# Patient Record
Sex: Female | Born: 1955
Health system: Southern US, Community
[De-identification: ages and names within clinical notes are randomized; demographics above are authoritative.]

## PROBLEM LIST (undated history)

## (undated) DIAGNOSIS — M1990 Unspecified osteoarthritis, unspecified site: Secondary | ICD-10-CM

## (undated) DIAGNOSIS — Z973 Presence of spectacles and contact lenses: Secondary | ICD-10-CM

## (undated) DIAGNOSIS — E785 Hyperlipidemia, unspecified: Secondary | ICD-10-CM

## (undated) DIAGNOSIS — M359 Systemic involvement of connective tissue, unspecified: Secondary | ICD-10-CM

## (undated) DIAGNOSIS — G43909 Migraine, unspecified, not intractable, without status migrainosus: Secondary | ICD-10-CM

## (undated) DIAGNOSIS — E2839 Other primary ovarian failure: Secondary | ICD-10-CM

## (undated) DIAGNOSIS — T148XXA Other injury of unspecified body region, initial encounter: Secondary | ICD-10-CM

## (undated) DIAGNOSIS — C4490 Unspecified malignant neoplasm of skin, unspecified: Secondary | ICD-10-CM

## (undated) DIAGNOSIS — I639 Cerebral infarction, unspecified: Secondary | ICD-10-CM

## (undated) DIAGNOSIS — T8859XA Other complications of anesthesia, initial encounter: Secondary | ICD-10-CM

## (undated) DIAGNOSIS — T7840XA Allergy, unspecified, initial encounter: Secondary | ICD-10-CM

## (undated) DIAGNOSIS — R87619 Unspecified abnormal cytological findings in specimens from cervix uteri: Secondary | ICD-10-CM

## (undated) DIAGNOSIS — E559 Vitamin D deficiency, unspecified: Secondary | ICD-10-CM

## (undated) DIAGNOSIS — I10 Essential (primary) hypertension: Secondary | ICD-10-CM

## (undated) DIAGNOSIS — J45909 Unspecified asthma, uncomplicated: Secondary | ICD-10-CM

## (undated) DIAGNOSIS — K219 Gastro-esophageal reflux disease without esophagitis: Secondary | ICD-10-CM

## (undated) DIAGNOSIS — G43119 Migraine with aura, intractable, without status migrainosus: Secondary | ICD-10-CM

## (undated) HISTORY — DX: Unspecified abnormal cytological findings in specimens from cervix uteri: R87.619

## (undated) HISTORY — DX: Other injury of unspecified body region, initial encounter: T14.8XXA

## (undated) HISTORY — PX: OTHER SURGICAL HISTORY: SHX169

## (undated) HISTORY — DX: Hyperlipidemia, unspecified: E78.5

## (undated) HISTORY — PX: COLONOSCOPY: SHX174

## (undated) HISTORY — DX: Allergy, unspecified, initial encounter: T78.40XA

## (undated) HISTORY — DX: Gastro-esophageal reflux disease without esophagitis: K21.9

## (undated) HISTORY — DX: Migraine with aura, intractable, without status migrainosus: G43.119

## (undated) HISTORY — DX: Migraine, unspecified, not intractable, without status migrainosus: G43.909

## (undated) HISTORY — DX: Cerebral infarction, unspecified: I63.9

## (undated) HISTORY — DX: Systemic involvement of connective tissue, unspecified: M35.9

---
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
## 1998-08-07 ENCOUNTER — Other Ambulatory Visit: Admission: RE | Admit: 1998-08-07 | Discharge: 1998-08-07 | Payer: Self-pay | Admitting: Obstetrics and Gynecology

## 1999-08-15 ENCOUNTER — Other Ambulatory Visit: Admission: RE | Admit: 1999-08-15 | Discharge: 1999-08-15 | Payer: Self-pay | Admitting: Obstetrics and Gynecology

## 2000-09-04 ENCOUNTER — Other Ambulatory Visit: Admission: RE | Admit: 2000-09-04 | Discharge: 2000-09-04 | Payer: Self-pay | Admitting: Obstetrics and Gynecology

## 2000-11-03 ENCOUNTER — Encounter (INDEPENDENT_AMBULATORY_CARE_PROVIDER_SITE_OTHER): Payer: Self-pay | Admitting: Specialist

## 2000-11-04 ENCOUNTER — Inpatient Hospital Stay (HOSPITAL_COMMUNITY): Admission: RE | Admit: 2000-11-04 | Discharge: 2000-11-05 | Payer: Self-pay | Admitting: Obstetrics and Gynecology

## 2001-04-29 HISTORY — PX: VAGINAL HYSTERECTOMY: SUR661

## 2001-04-29 HISTORY — PX: ABDOMINAL HYSTERECTOMY: SHX81

## 2002-04-12 ENCOUNTER — Encounter (INDEPENDENT_AMBULATORY_CARE_PROVIDER_SITE_OTHER): Payer: Self-pay | Admitting: Internal Medicine

## 2002-04-12 ENCOUNTER — Ambulatory Visit (HOSPITAL_COMMUNITY): Admission: RE | Admit: 2002-04-12 | Discharge: 2002-04-12 | Payer: Self-pay | Admitting: Internal Medicine

## 2002-04-16 ENCOUNTER — Ambulatory Visit (HOSPITAL_COMMUNITY): Admission: RE | Admit: 2002-04-16 | Discharge: 2002-04-16 | Payer: Self-pay | Admitting: Internal Medicine

## 2002-04-23 ENCOUNTER — Encounter (INDEPENDENT_AMBULATORY_CARE_PROVIDER_SITE_OTHER): Payer: Self-pay | Admitting: Internal Medicine

## 2002-04-23 ENCOUNTER — Ambulatory Visit (HOSPITAL_COMMUNITY): Admission: RE | Admit: 2002-04-23 | Discharge: 2002-04-23 | Payer: Self-pay | Admitting: Internal Medicine

## 2003-02-04 ENCOUNTER — Other Ambulatory Visit: Admission: RE | Admit: 2003-02-04 | Discharge: 2003-02-04 | Payer: Self-pay | Admitting: Obstetrics and Gynecology

## 2004-01-03 ENCOUNTER — Emergency Department (HOSPITAL_COMMUNITY): Admission: EM | Admit: 2004-01-03 | Discharge: 2004-01-03 | Payer: Self-pay | Admitting: Family Medicine

## 2004-04-29 DIAGNOSIS — R87619 Unspecified abnormal cytological findings in specimens from cervix uteri: Secondary | ICD-10-CM

## 2004-04-29 HISTORY — DX: Unspecified abnormal cytological findings in specimens from cervix uteri: R87.619

## 2005-01-04 ENCOUNTER — Other Ambulatory Visit: Admission: RE | Admit: 2005-01-04 | Discharge: 2005-01-04 | Payer: Self-pay | Admitting: Obstetrics and Gynecology

## 2005-03-26 ENCOUNTER — Emergency Department (HOSPITAL_COMMUNITY): Admission: EM | Admit: 2005-03-26 | Discharge: 2005-03-27 | Payer: Self-pay | Admitting: Emergency Medicine

## 2005-03-28 ENCOUNTER — Ambulatory Visit: Payer: Self-pay | Admitting: Internal Medicine

## 2005-04-01 ENCOUNTER — Ambulatory Visit (HOSPITAL_COMMUNITY): Admission: RE | Admit: 2005-04-01 | Discharge: 2005-04-01 | Payer: Self-pay | Admitting: Internal Medicine

## 2005-08-06 ENCOUNTER — Ambulatory Visit (HOSPITAL_COMMUNITY): Admission: RE | Admit: 2005-08-06 | Discharge: 2005-08-06 | Payer: Self-pay | Admitting: Internal Medicine

## 2005-09-09 ENCOUNTER — Inpatient Hospital Stay (HOSPITAL_COMMUNITY): Admission: RE | Admit: 2005-09-09 | Discharge: 2005-09-12 | Payer: Self-pay | Admitting: Neurosurgery

## 2007-04-30 HISTORY — PX: BACK SURGERY: SHX140

## 2009-10-16 ENCOUNTER — Ambulatory Visit (HOSPITAL_COMMUNITY): Admission: RE | Admit: 2009-10-16 | Discharge: 2009-10-16 | Payer: Self-pay | Admitting: Obstetrics and Gynecology

## 2009-10-25 ENCOUNTER — Ambulatory Visit (HOSPITAL_COMMUNITY): Admission: RE | Admit: 2009-10-25 | Discharge: 2009-10-25 | Payer: Self-pay | Admitting: Obstetrics and Gynecology

## 2010-09-14 NOTE — Op Note (Signed)
[facility]  Patient:    Victoria Cooper, Victoria Cooper                    MRN: [id number] Proc. Date: 11/03/00 Adm. Date:  [id number] Attending:  Rhina Brackett                           Operative Report  PREOPERATIVE DIAGNOSIS:       Menorrhagia.  POSTOPERATIVE DIAGNOSIS:      Menorrhagia.  OPERATION/PROCEDURE:          Total vaginal hysterectomy.  SURGEON:                      Duke Salvia. Marcelle Overlie, M.D.  ASSISTANTWilley Blade, M.D.  ANESTHESIA:                   Epidural.  COMPLICATIONS:                None.  DRAINS:                       In-and-out Foley catheter.  ESTIMATED BLOOD LOSS:         Estimated blood loss was 150 cc.  DESCRIPTION OF PROCEDURE:     The patient was taken to the operating room and after an adequate level of epidural anesthesia was obtained, with the patients legs in stirrups, the perineum and vagina were prepped and draped in the usual manner for a vaginal procedure.  The bladder was drained.  EUA was carried out and the uterus was normal size, mid position, mobile, adnexa negative.  A weighted speculum was positioned and the cervix grasped with a tenaculum.  The cervicovaginal mucosa was incised with the scalpel and a posterior colpotomy performed without difficulty.  The right uterosacral ligament was then divided and tissue ligated in Heaney fashion with 0 Dexon and held, and the same repeated on the opposite side.  The bladder was advanced superiorly with sharp and blunt dissection and the anterior peritoneal reflection identified, entered sharply, and a retractor positioned to gently elevate the bladder out of the field.  Using the Ligasure coagulation system the cardinal ligament, uterine vasculature pedicle, and upper broad ligament pedicles were clamped, coagulated, and divided, with excellent hemostasis.  The fundus of the uterus was then delivered posteriorly and the utero-ovarian pedicles  were clamped, divided, and first a free-tie followed by suture ligature of 0 Dexon placed.  The cuff was then closed with a running 2-0 Dexon suture.  All major pedicles were inspected and noted to be hemostatic.  Prior to closure sponge, needle, and instrument counts were reported as correct x 2.  A McCalls culdoplasty suture of 2-0 Dexon was then placed from the left uterosacral ligament, picking up posterior peritoneum across to the right uterosacral ligament and tied down to reinforce posterior support.  The cuff was closed from right to left with interrupted 2-0 Monocryl sutures.  The Foley catheter was in position and draining clear urine.  She tolerated this well and went to the recovery room in good condition. DD:  11/03/00 TD:  11/03/00 Job: [id number]

## 2010-09-14 NOTE — Op Note (Signed)
NAME:  Victoria Cooper, Victoria Cooper NO.:  [id number]   MEDICAL RECORD NO.:  [id number]          PATIENT TYPE:  INP   LOCATION:  3004                         FACILITY:  [facility]   PHYSICIAN:  Hewitt Shorts, M.D.DATE OF BIRTH:  [birth year]   DATE OF PROCEDURE:  09/09/2005  DATE OF DISCHARGE:                                 OPERATIVE REPORT   PREOPERATIVE DIAGNOSES:  1.  L5 pars interarticularis defect.  2.  Static grade 1-2 L5 and S1 spondylolisthesis.  3.  L4-5 lumbar disk herniation.  4.  L5-S1 lumbar disk herniation.  5.  Lumbar radiculopathy.   POSTOPERATIVE DIAGNOSES:  1.  L5 pars interarticularis defect.  2.  Static grade 1-2 L5 and S1 spondylolisthesis.  3.  L4-5 lumbar disk herniation.  4.  L5-S1 lumbar disk herniation.  5.  Lumbar radiculopathy.   PROCEDURE:  1.  L4 laminectomy.  2.  L5 Gill procedure.  3.  Left L4-5 transverse posterior lumbar interbody fusion procedure.  4.  L5-S1 posterior lumbar interbody fusion procedure.  5.  L4-S1 posterolateral arthrodesis with AVS PEEK interbody implants for      interbody fusions and 90-D posterior instrumentation for the      posterolateral arthrodesis and VITOSS with bone marrow aspirate and      locally harvested morcelized autograft and microdissection.   SURGEON:  Hewitt Shorts, M.D.   ASSISTANTS:  1.  Stefani Dama, M.D.  2.  Perlie Gold L. Webb Silversmith, R.N.   INDICATION:  The patient is a 55 year old woman who presented with low back  and bilateral lumbar radicular pain, right worse than left.  The patient was  found to have L5 pars interarticular defect with a grade 1-2  spondylolisthesis of L5 on S1 with essentially left L4-5 lumbar disk  herniation and broad-based disk herniation at L5-S1 and a decision was made  to proceed with decompression and arthrodesis.   DESCRIPTION OF PROCEDURE:  The patient was brought to the operating room and  placed under general endotracheal anesthesia.  The patient was  turned to a  prone position and the lumbar region was prepped with Betadine soaping  solution and draped in a sterile fashion.  The midline was infiltrated with  a local anesthetic with epinephrine.  An x-ray was taken and the L4-5 and L5-  S1 levels were identified and a midline incision was made over the L4-5 and  L5-S1 levels and carried down through the subcutaneous tissue.  Bipolar  cautery and electrocautery were used to maintain hemostasis.  Dissection was  carried down to the lumbar fascia, which was incised bilaterally and the  paraspinal muscles dissected of the spinous processes and lamina in a  subperiosteal fashion.  A self-retaining retractor was placed.  The loose  posterior elements of L5 were immediately identified, as was the listhesis  and x-ray was taken through to confirm the localization and then we  proceeded with L5 Gill procedure, removing the posterior elements intact.  These were subsequently cleaned of soft tissue and morcelized to later be  used as bone graft.  We then performed inferior L4 laminectomy and  the  microscope was then draped and brought into the field to provide additional  magnification, illumination and visualization and the remainder of the  decompression was performed using microdissection and microsurgical  technique.  There was significant spondylitic overgrowth and pseudoarthrosis  associated with the listhesis.  We were able to progressively remove that  and decompress the exiting L5 nerve roots bilaterally.  The left L4-5  annulus was identified, as was the L5-S1 annulus identified bilaterally.  On  the right side at L5-S1, there was a disk herniation with a fragment  protruding through the annulus.  There was subligamentous disk herniation as  well at L5-S1, as well as central and to the left at L4-5.  Dissection was  then performed on the left side at L4-5 and bilaterally at L5-S1 with  incision of the annulus continued with a variety of  pituitary rongeurs and  microcurettes.  There was significant spondylitic spurring, particularly  from the posterosuperior aspect of S1 and this was carefully removed and we  were able to decompress the thecal sac and nerve roots bilaterally.  The  disk spaces were then prepared for interbody arthrodesis.  The cartilaginous  endplates were carefully removed and then we measured the height of the  intervertebral disk spaces and selected a 12-mm PLIF implant to place at the  L4-5 level and 8-mm in height implants for the L5-S1 level.  We placed a 25-  mm implant on the right and a 20-mm implant on the left at L5-S1.  Once the  diskectomy and endplate preparation had been completed, we probed the L5  pedicle and aspirated bone marrow aspirate; it was injected over a 10-mL  strip of VITOSS and then each of the implants were packed with the VITOSS  with bone marrow aspirate.  We first carefully retracted the thecal sac and  nerve root at the L4-5 level and positioned the TLIF implant.  We then  proceeded to the L5-S1 level, placing first the implant on the right side  and subsequently the implant on the left side.  Each of those implants was  sunk down to the posterior aspect of the L5 vertebral body.  We then  proceeded with the posterolateral arthrodesis.  We exposed the transverse  process of L4 and L5 bilaterally as well as the ala of S1.  We selected  pedicle entry sites at L4, L5 and S1 bilaterally and each of the pedicles  were probed using C-arm fluoroscopic guidance.  They were examined with a  ball probe to ensure there were no cutouts and then each of the pedicles was  tapped.  We tapped the L4 and L5 pedicles with a 4.25-mm tap and the S1  pedicle with a 5.25-mm tap.  We again examined each of the pedicles with a  ball probe to ensure good threading and no cutouts and we placed 4.75 x 45-  mm screws on the left side at L4 and L5 and 4.75 x 40-mm screws on the right side at L4 and L5  and 5.75 x 30-mm screws bilaterally at S1.  We selected 50-  mm pre-lordosed rods; they were placed in the screw heads and secured with  locking caps, which were subsequently tightened against counter-torque.  Once all of the construct was completed, the C-arm fluoroscope was removed  and we then decorticated the transverse process of L4 and L5 bilaterally as  well as the ala of S1 and we packed the lateral gutters with the morcelized  autograft as well as the additional VITOSS with bone marrow aspirate.  The  wound was irrigated numerous times during the procedure with Bacitracin  solution.  Good hemostasis was established and confirmed at the completion  of the procedure and then we proceeded with closure.  The deep fascia was  closed with interrupted undyed #1 Vicryl sutures, Scarpa's fascia closed  with interrupted undyed inverted #1Vicryl and the subcutaneous and  subcuticular layers were closed with interrupted and inverted 2-0 undyed  Vicryl and the skin was reapproximated with Dermabond and the wound was  dressed with Adaptic, sterile gauze and Hypafix.  The procedure was  tolerated well.  The estimated blood loss was 600 mL.  We did return 300 mL  of Cell Saver blood to the patient.  Sponge, instrument and needle count  were correct.  Following surgery, the patient is to be turned back to the  supine position, to be reversed from the anesthetic, extubated and  transferred to the recovery room for further care.     Hewitt Shorts, M.D.  Electronically Signed    RWN/MEDQ  D:  09/09/2005  T:  09/10/2005  Job:  [id number]

## 2010-09-14 NOTE — H&P (Signed)
NAME:  Victoria Cooper, Victoria Cooper             ACCOUNT NO.:  [id number]   MEDICAL RECORD NO.:  [id number]          PATIENT TYPE:  INP   LOCATION:  NA                           FACILITY:  [facility]   PHYSICIAN:  Hewitt Shorts, M.D.DATE OF BIRTH:  1955-06-18   DATE OF ADMISSION:  09/09/2005  DATE OF DISCHARGE:                                HISTORY & PHYSICAL   HISTORY OF PRESENT ILLNESS:  The patient is a 55 year old right-handed white  female who is seen for evaluation of L5 pars interarticularis defect and an  L5 and S1 congenital spondylolisthesis, an L5-S1 disk herniation with  pseudoarthrosis and stenosis, as well as a left L4-5 disk herniation.   The patient explains that up until about two months ago, her symptoms were  only occasional minimal low back discomfort but then two months ago she  developed numbness across the dorsum of her right foot into the right heel,  increasing low back pain, pain into the buttock and thighs bilaterally,  right worse than left.  At times it seems to bother one side more, at other  times the other side more.  She had been treated initially with Aleve.  Then  she was tried on Relafen.  She typically found that the more active she was,  the more comfortable, and has elected to go ahead with the surgical  decompression and stabilization of the spine and is admitted now for such.   PAST MEDICAL HISTORY:  1.  History of hypertension.  2.  Some mild asthma.  3.  No history of myocardial infarction, cancer, stroke, diabetes or peptic      ulcer disease.   PAST SURGICAL HISTORY:  Hysterectomy in July 2002 by Duke Salvia. Marcelle Overlie,  M.D.   She reports an allergy to SULFA.   CURRENT MEDICATIONS:  1.  Lotrel 5/20 mg q.a.m.  2.  Nasonex spray p.r.n.  3.  Albuterol inhaler p.r.n.  4.  Advair 100/50 mcg one puff b.i.d.  5.  Relafen 500 mg b.i.d.   FAMILY HISTORY:  Her parents both have hypertension.  Her father has had  bypass surgery 23 years ago.  Mother is  age 40, father is age 61.   SOCIAL HISTORY:  The patient is married.  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.  She does not smoke.  She drinks alcoholic beverages  occasionally.  She denies a history of substance abuse.   REVIEW OF SYSTEMS:  Notable for those described in the history of present  illness and past medical history but is otherwise unremarkable.   PHYSICAL EXAMINATION:  GENERAL:  The patient is a well-developed, well-  nourished white female in no acute distress.  VITAL SIGNS:  Her temperature is 97.0, pulse is 83, blood pressure 103/68,  respiratory rate 18.  Height 5 feet 2 inches.  Weight 143 pounds.  LUNGS:  Clear to auscultation.  She has symmetrical respiratory excursion.  CARDIAC:  A regular rhythm with normal S1, S2.  There is no murmur.  ABDOMEN:  Soft,  nondistended, bowel sounds are present.  EXTREMITIES:  There is no clubbing, cyanosis or edema.  MUSCULOSKELETAL:  There is no tenderness on palpation of the lumbar spinous  processes or paralumbar musculature.  She is able to flex to 80 degrees,  able to extend to 20 degrees.  She has mild discomfort on flexion-extension.  Straight leg raising is negative bilaterally.  NEUROLOGIC:  5/5 strength of the lower extremities including the iliopsoas,  quadriceps, dorsiflexors, extensor hallucis longus and plantar flexor  bilaterally.  Sensation is intact to pinprick through the upper and lower  extremities.  Reflexes are 2 at the quadriceps and gastrocnemius and  symmetrical bilaterally.  Toes are downgoing bilaterally.  She has a normal  gait and stance.   DIAGNOSTIC STUDIES:  X-rays show L5 pars interarticularis defect with a  grade 1-2 spondylolisthesis at L5-S1, which is static through flexion-  extension.  MRI scan reconfirms the spondylolisthesis.  There is  degenerative disk disease with desiccation of the disks at L4-5 and  L5-S1.  At L4-5 there is a central to the left disk protrusion and at L5-S1 there is  a bilateral L5 pars interarticularis defect and a pseudoarthrosis, right  worse than left.  There is neural foraminal stenosis, right versus left,  with broad-based disk herniation at that level.   IMPRESSION:  Patient with low back lumbar radicular pain and numbness.  She  has an L5 pars interarticularis defect (congenital), and L5-S1  spondylolisthesis with associated disk herniation, pseudoarthrosis and  stenosis, and at L4-5 there is a disk herniation central and to the left.   PLAN:  The patient will be admitted for an L5 Gill procedure, L4  laminectomy, L4-5 and L5-S1 posterior lumbar interbody fusion and  posterolateral arthrodesis with interbody implants, posterior  instrumentation and bone graft.  We have discussed the nature of the  surgical procedure at length with the patient on several occasions as well  as risks of surgery including the risks of infection, bleeding with  possibility of transfusion, the risk of nerve root dysfunction, pain,  weakness, numbness or paresthesias, the risk of dural tear or CSF leak, the  possible need for further surgery, and anesthetic risks of myocardial  infarction, stroke, pneumonia and death.  Understanding all this, she wishes  to proceed with surgery and is admitted for such.      Hewitt Shorts, M.D.  Electronically Signed     RWN/MEDQ  D:  09/09/2005  T:  09/09/2005  Job:  [id number]

## 2010-09-14 NOTE — Discharge Summary (Signed)
[facility]  Patient:    Victoria Cooper, Victoria Cooper                    MRN: [id number] Adm. Date:  [id number] Disc. Date: 11/05/00 Attending:  Rhina Brackett                           Discharge Summary  DISCHARGE DIAGNOSES:          1. Menorrhagia.                               2. Total vaginal hysterectomy this admission.  SUMMARY OF THE HISTORY AND PHYSICAL EXAMINATION:         Please see admission history and physical for details. Briefly, a 55 year old, G3, P3, her husband has had a vasectomy, with menorrhagia unresponsive to conservative measures, presents now for total vaginal hysterectomy.  HOSPITAL COURSE:              On July 8, under epidural anesthesia, the patient underwent TVH without complication. EBL was 150 cc. On the first postoperative day she was afebrile. Her diet was advanced. By the second postoperative day, she was feeling well, tolerating a regular diet and was ready for discharge at that point. At the time of discharge, she was afebrile, ambulating without difficulty.  LABORATORY DATA:              Preoperative hemoglobin 12.5, postoperative was 11.5. UA on admission was negative. Blood type was A positive, antibody screen was negative.  DISPOSITION:                  Patient was discharged on Tylox p.r.n. pain. She will return to the office in seven to ten days. She was advised to report any increased vaginal bleeding, fever over 101, or urinary complaints. She was given specific instructions regarding diet, sex, exercise.  CONDITION:                    Good.  ACTIVITY:                     Gradually increase. DD:  11/05/00 TD:  11/05/00 Job: [id number]

## 2010-09-14 NOTE — Discharge Summary (Signed)
NAME:  Victoria Cooper, MANCINO NO.:  [id number]   MEDICAL RECORD NO.:  [id number]          PATIENT TYPE:  INP   LOCATION:  3004                         FACILITY:  [facility]   PHYSICIAN:  Hewitt Shorts, M.D.DATE OF BIRTH:  1955/12/14   DATE OF ADMISSION:  09/09/2005  DATE OF DISCHARGE:  09/12/2005                                 DISCHARGE SUMMARY   ADMISSION HISTORY AND PHYSICAL EXAMINATION:  The patient is a 55 year old  woman who presented with low back pain and pain radiating down to the lower  extremities.  She had an L5 pars interarticularis defect with a congenital  grade 1-2 L5 on S1 spondylolisthesis, with an L5-S1 disc herniation with  pseudoarthrosis and stenosis, as well as a left L4-5 lumbar disc herniation.  Decision made to admit the patient for decompression and arthrodesis.  Further details of her history are included in her admission note.  Her  examination showed an unremarkable general examination.  Neurological  examination showed intact strength and sensation.   HOSPITAL COURSE:  The patient was admitted, underwent an L4 laminectomy, L5  Gill procedure, left L4-5 TLIF, bilateral L5-S1 PLIF, and an L4-S1  posterolateral arthrodesis with posterior instrumentation and bone graft.  Following surgery the patient did well with good relief of her pain and  discomfort.  She got up the evening of surgery.  The following day we were  able to discontinue her Foley catheter and PCA morphine pump.  Yesterday we  hoped to let her go home.  However, when she first got up in the morning,  sitting up in a chair she had a syncopal episode.  She was returned to bed  and improved.  Since then she has been treated solely with Naprosyn, has had  good comfort and no further syncopal spells.  She has been up and  ambulating.  We removed her dressing and her wound is healing nicely, she is  afebrile, and we are going to discharge her to home today.  We have given  her  instructions regarding wound care and activities.  She is to wear her  lumbar corset when she is up and about.  She is given a number of  prescriptions including Vicodin one or two q.4-6h. p.r.n. pain (moderate)  #40 tablets, no refills; Darvocet-N 100 one or two tablets p.o. q.4-6h.  p.r.n. pain (mild) #40 tablets, no refills; Flexeril 5 mg t.i.d. p.r.n.  muscle spasms #40 tablets, one refill.  She is also advised to use Relafen  which she has at home 500 mg b.i.d.   She is to return to my office in 3 weeks for followup with AP and lumbar  spine x-rays.  She is to contact us if she has any difficulties.   Her normal blood pressure pill, Lotrel, has been held while she has been in  the hospital because her blood pressure has been relatively low, and I have  told her to monitor her blood pressure at home.  When it is consistently  greater than 120 systolic she can resume taking her blood pressure pill and  she should follow up  with her primary care physician, Dr. Merri Brunette.   DISCHARGE DIAGNOSES:  1.  L5 pars interarticularis defect with an associated grade 1-2      spondylolisthesis L5 on S1.  2.  Lumbar stenosis.  3.  Lumbar disc herniation.  4.  Lumbar radiculopathy.  5.  Syncopal spell.      Hewitt Shorts, M.D.  Electronically Signed     RWN/MEDQ  D:  09/12/2005  T:  09/12/2005  Job:  [id number]

## 2010-09-14 NOTE — H&P (Signed)
[facility]  Patient:    Victoria Cooper, Victoria Cooper                      MRN: [id number] Adm. Date:  11/03/00 Attending:  Duke Salvia. Marcelle Overlie, M.D.                         History and Physical  CHIEF COMPLAINT:              Menorrhagia.  HISTORY OF PRESENT ILLNESS:   A 55 year old G3, P3.  Her husband has had a vasectomy.  She has had bothersome menorrhagia since 1998.  Ultrasound done June 2001 was normal.  We had discussed the possibility of OCPs, which she had declined until recently.  Due to heavier bleeding, she has required Loestrin 1/20 b.i.d. and has been on this q.d. since June 9 with resolution of her bleeding.  She has also been on Hemocyte as an iron replacement.  She presents now for [facility].  This procedure including risks, relative bleeding, infection, adjacent organ injury, the possible need for open or additional surgery, were all discussed with her, which she understands and accepts.  Of note, we did a trial of OCPs in 1998 without a substantial improvement in her menorrhagia with a negative saline hysterogram.  ALLERGIES:                    None.  PAST SURGICAL HISTORY:        None.  PAST OBSTETRICAL HISTORY:     Three vaginal deliveries at term.  Her husband has had a vasectomy.  REVIEW OF SYSTEMS:            Otherwise unremarkable.  PHYSICAL EXAMINATION:  VITAL SIGNS:                  Temperature 98.2, blood pressure 120/78.  HEENT:                        Unremarkable.  NECK:                         Supple without mass.  LUNGS:                        Clear.  CARDIOVASCULAR:               Regular rate and rhythm without murmurs, rubs, or gallops.  BREASTS:                      Without masses.  ABDOMEN:                      Soft, flat, and nontender.  PELVIC:                       Normal external genitalia.  ______ clear. Uterus mid position, normal size, mobile.  Adnexa negative.  IMPRESSION:                   Menorrhagia  unresponsive to conservative measures.  PLAN:                         Total vaginal hysterectomy.  Procedure and risks discussed, as above. DD:  10/24/00 TD:  10/24/00 Job: [id number]

## 2010-09-14 NOTE — Op Note (Signed)
NAME:  Victoria Cooper, Victoria Cooper                       ACCOUNT NO.:  [id number]   MEDICAL RECORD NO.:  [id number]                   PATIENT TYPE:  AMB   LOCATION:  DAY                                  FACILITY:  [facility]   PHYSICIAN:  Lionel December, M.D.                 DATE OF BIRTH:  1955-10-17   DATE OF PROCEDURE:  04/16/2002  DATE OF DISCHARGE:                                 OPERATIVE REPORT   PROCEDURE:  Total colonoscopy with terminal ileoscopy.   INDICATIONS:  The patient is a 55 year old Caucasian female with over a year  history of intermittent episodes of diarrhea and left-sided pain.  Stool  studies are negative.  She had small bowel study which revealed short  transit time, otherwise normal.  The radiologist made a comment that she  could have colonic diverticulosis or diverticulitis; however, this was not a  formal lower GI tract study.  She is now undergoing colonoscopy to further  evaluate these symptoms.  The procedure and risks were reviewed with the  patient, and informed consent was obtained.   PREMEDICATION:  Demerol 50 mg IV, Versed 5 mg IV in divided dose.   INSTRUMENT USED:  Olympus video system.   FINDINGS:  Procedure performed in endoscopy suite.  The patient's vital  signs and O2 saturation were monitored during the procedure and remained  stable.  The patient was placed in the left lateral recumbent position and  rectal examination performed.  This was within normal limits.  The scope was  placed in the rectum and advanced under vision into the sigmoid colon and  beyond.  The preparation was satisfactory.  The scope was passed into the  cecum, which was identified by ileocecal valve and appendiceal orifice.  Pictures taken for the record.  The TI was also examined for 10-15 cm and  was normal.  Once again pictures were taken for the record.  As the scope  was withdrawn, colonic mucosa was carefully examined and was normal  throughout.  There were no diverticula  noted.  The rectal mucosa was normal.  The scope was retroflexed to examine the anorectal junction, and small  hemorrhoids were noted below the dentate line.  The endoscope was withdrawn.  The patient tolerated the procedure well.   FINAL DIAGNOSIS:  Normal colonoscopy and terminal ileoscopy except small  external hemorrhoids.    RECOMMENDATIONS:  1. She will resume her Bentyl at 10 mg t.i.d., which was started last week     when she was seen in the office.  2. Will proceed with 24-hour urine collection for 5HIAA and abdominopelvic     CT.                                               Lionel December, M.D.  NR/MEDQ  D:  04/16/2002  T:  04/17/2002  Job:  [id number]   cc:   Kingsley Callander. Ouida Sills, M.D.  [address]  [place]  [place] [address]  Fax: [phone]   [facility] Salvia. Marcelle Overlie, M.D.  [address] [place]  [place] [address]  Fax: [phone]

## 2011-03-05 ENCOUNTER — Other Ambulatory Visit (HOSPITAL_COMMUNITY): Payer: Self-pay | Admitting: Obstetrics and Gynecology

## 2011-03-05 DIAGNOSIS — Z139 Encounter for screening, unspecified: Secondary | ICD-10-CM

## 2011-03-11 ENCOUNTER — Ambulatory Visit (HOSPITAL_COMMUNITY)
Admission: RE | Admit: 2011-03-11 | Discharge: 2011-03-11 | Disposition: A | Discharge status: Home / Self Care | Payer: 59 | Source: Ambulatory Visit | Attending: Obstetrics and Gynecology | Admitting: Obstetrics and Gynecology

## 2011-03-11 ENCOUNTER — Emergency Department (HOSPITAL_COMMUNITY)
Admission: EM | Admit: 2011-03-11 | Discharge: 2011-03-11 | Disposition: A | Discharge status: Home / Self Care | Payer: 59 | Attending: Emergency Medicine | Admitting: Emergency Medicine

## 2011-03-11 ENCOUNTER — Encounter: Payer: Self-pay | Admitting: Emergency Medicine

## 2011-03-11 DIAGNOSIS — Z1231 Encounter for screening mammogram for malignant neoplasm of breast: Secondary | ICD-10-CM | POA: Insufficient documentation

## 2011-03-11 DIAGNOSIS — S301XXA Contusion of abdominal wall, initial encounter: Secondary | ICD-10-CM | POA: Insufficient documentation

## 2011-03-11 DIAGNOSIS — S31109A Unspecified open wound of abdominal wall, unspecified quadrant without penetration into peritoneal cavity, initial encounter: Secondary | ICD-10-CM | POA: Insufficient documentation

## 2011-03-11 DIAGNOSIS — Z139 Encounter for screening, unspecified: Secondary | ICD-10-CM

## 2011-03-11 DIAGNOSIS — W540XXA Bitten by dog, initial encounter: Secondary | ICD-10-CM | POA: Insufficient documentation

## 2011-03-11 DIAGNOSIS — Z23 Encounter for immunization: Secondary | ICD-10-CM | POA: Insufficient documentation

## 2011-03-11 DIAGNOSIS — I10 Essential (primary) hypertension: Secondary | ICD-10-CM | POA: Insufficient documentation

## 2011-03-11 HISTORY — DX: Essential (primary) hypertension: I10

## 2011-03-11 MED ORDER — IBUPROFEN 800 MG PO TABS
800.0000 mg | ORAL_TABLET | Freq: Once | ORAL | Status: AC
  Administered 2011-03-11: 800 mg via ORAL
  Filled 2011-03-11: qty 1

## 2011-03-11 MED ORDER — RABIES IMMUNE GLOBULIN 150 UNIT/ML IM INJ
INJECTION | INTRAMUSCULAR | Status: AC
  Filled 2011-03-11: qty 10

## 2011-03-11 MED ORDER — RABIES IMMUNE GLOBULIN 150 UNIT/ML IM INJ
20.0000 [IU]/kg | INJECTION | Freq: Once | INTRAMUSCULAR | Status: DC
  Filled 2011-03-11: qty 9.5

## 2011-03-11 MED ORDER — AMOXICILLIN-POT CLAVULANATE 875-125 MG PO TABS: 1.0000 | ORAL_TABLET | Freq: Once | ORAL | Status: AC

## 2011-03-11 MED ORDER — AMOXICILLIN-POT CLAVULANATE 875-125 MG PO TABS
1.0000 | ORAL_TABLET | Freq: Once | ORAL | Status: AC
  Administered 2011-03-11: 1 via ORAL
  Filled 2011-03-11: qty 1

## 2011-03-11 MED ORDER — RABIES IMMUNE GLOBULIN 150 UNIT/ML IM INJ
20.0000 [IU]/kg | INJECTION | Freq: Once | INTRAMUSCULAR | Status: AC
  Administered 2011-03-11: 1425 [IU] via INTRAMUSCULAR
  Filled 2011-03-11: qty 9.5

## 2011-03-11 MED ORDER — RABIES VACCINE, PCEC IM SUSR
1.0000 mL | Freq: Once | INTRAMUSCULAR | Status: AC
  Administered 2011-03-11: 1 mL via INTRAMUSCULAR
  Filled 2011-03-11: qty 1

## 2011-03-11 NOTE — ED Provider Notes (Signed)
Medical screening examination/treatment/procedure(s) were performed by non-physician practitioner and as supervising physician I was immediately available for consultation/collaboration.   Kaytlen Lightsey L Mathews Stuhr, MD 03/11/11 2322 

## 2011-03-11 NOTE — ED Notes (Signed)
Pt a/ox4. resp even and unlabored. NAD at this time. D/Cinstructions reviewed with pt. Pt verbalized understanding. Pt ambulated to POV with steady gate.  

## 2011-03-11 NOTE — ED Notes (Signed)
Pt c/o dog bite to her abd. Dog did not have a rabies shot and pt was sent over by her pcp for rabies injection.

## 2011-03-11 NOTE — ED Notes (Signed)
Attempted to make report to [facility] per [facility]. 

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
## 2011-03-14 ENCOUNTER — Encounter (HOSPITAL_COMMUNITY): Payer: 59 | Attending: Emergency Medicine

## 2011-03-14 DIAGNOSIS — Z23 Encounter for immunization: Secondary | ICD-10-CM | POA: Insufficient documentation

## 2011-03-14 DIAGNOSIS — Z203 Contact with and (suspected) exposure to rabies: Secondary | ICD-10-CM | POA: Insufficient documentation

## 2011-03-14 DIAGNOSIS — T148XXA Other injury of unspecified body region, initial encounter: Secondary | ICD-10-CM

## 2011-03-14 MED ORDER — RABIES VACCINE, PCEC IM SUSR
1.0000 mL | Freq: Once | INTRAMUSCULAR | Status: AC
  Administered 2011-03-14: 1 mL via INTRAMUSCULAR

## 2011-03-14 NOTE — Progress Notes (Signed)
Tracie Harrier presents today for injection per MD orders. Rabavert 1ml administered IM in RT deltoid  Administration without incident. Patient tolerated well.

## 2011-03-18 ENCOUNTER — Encounter (HOSPITAL_COMMUNITY): Payer: Self-pay

## 2011-03-18 ENCOUNTER — Encounter (HOSPITAL_COMMUNITY): Payer: 59

## 2011-03-18 DIAGNOSIS — T148XXA Other injury of unspecified body region, initial encounter: Secondary | ICD-10-CM | POA: Insufficient documentation

## 2011-03-18 HISTORY — DX: Other injury of unspecified body region, initial encounter: T14.8XXA

## 2011-03-18 MED ORDER — RABIES VACCINE, PCEC IM SUSR
1.0000 mL | Freq: Once | INTRAMUSCULAR | Status: AC
  Administered 2011-03-18: 1 mL via INTRAMUSCULAR
  Filled 2011-03-18: qty 1

## 2011-03-18 NOTE — Progress Notes (Signed)
rabavert  Rabies vaccine given IM Z-track to right deltoidl tolerated well.Marland Kitchen

## 2011-03-25 ENCOUNTER — Encounter (HOSPITAL_COMMUNITY): Payer: 59

## 2011-03-25 DIAGNOSIS — T148XXA Other injury of unspecified body region, initial encounter: Secondary | ICD-10-CM

## 2011-03-25 MED ORDER — RABIES VACCINE, PCEC IM SUSR
1.0000 mL | Freq: Once | INTRAMUSCULAR | Status: AC
  Administered 2011-03-25: 1 mL via INTRAMUSCULAR
  Filled 2011-03-25: qty 1

## 2011-03-25 NOTE — Progress Notes (Signed)
Tolerated IM injection to rt deltoid well.

## 2011-07-04 ENCOUNTER — Ambulatory Visit (INDEPENDENT_AMBULATORY_CARE_PROVIDER_SITE_OTHER): Payer: 59 | Admitting: Otolaryngology

## 2011-07-04 DIAGNOSIS — R490 Dysphonia: Secondary | ICD-10-CM

## 2011-07-04 DIAGNOSIS — K219 Gastro-esophageal reflux disease without esophagitis: Secondary | ICD-10-CM

## 2011-07-04 DIAGNOSIS — R070 Pain in throat: Secondary | ICD-10-CM

## 2011-08-01 ENCOUNTER — Ambulatory Visit (INDEPENDENT_AMBULATORY_CARE_PROVIDER_SITE_OTHER): Payer: 59 | Admitting: Otolaryngology

## 2011-08-08 ENCOUNTER — Ambulatory Visit (INDEPENDENT_AMBULATORY_CARE_PROVIDER_SITE_OTHER): Payer: 59 | Admitting: Otolaryngology

## 2011-08-29 ENCOUNTER — Ambulatory Visit (INDEPENDENT_AMBULATORY_CARE_PROVIDER_SITE_OTHER): Payer: 59 | Admitting: Otolaryngology

## 2012-05-21 ENCOUNTER — Encounter (HOSPITAL_BASED_OUTPATIENT_CLINIC_OR_DEPARTMENT_OTHER): Payer: Self-pay | Admitting: *Deleted

## 2012-05-21 NOTE — Progress Notes (Signed)
To come in for bmet=ekg-chaplain Jeani Hawking Bring all meds and inhalers-overnight bag in case she needs to stay

## 2012-05-22 ENCOUNTER — Encounter (HOSPITAL_BASED_OUTPATIENT_CLINIC_OR_DEPARTMENT_OTHER)
Admission: RE | Admit: 2012-05-22 | Discharge: 2012-05-22 | Disposition: A | Discharge status: Home / Self Care | Payer: 59 | Source: Ambulatory Visit | Attending: Plastic Surgery | Admitting: Plastic Surgery

## 2012-05-22 LAB — BASIC METABOLIC PANEL
BUN: 19 mg/dL (ref 6–23)
Calcium: 9.3 mg/dL (ref 8.4–10.5)
Creatinine, Ser: 0.59 mg/dL (ref 0.50–1.10)
GFR calc Af Amer: >90 mL/min (ref >90)
GFR calc non Af Amer: >90 mL/min (ref >90)

## 2012-05-25 ENCOUNTER — Encounter (HOSPITAL_BASED_OUTPATIENT_CLINIC_OR_DEPARTMENT_OTHER): Payer: Self-pay | Admitting: Plastic Surgery

## 2012-05-25 ENCOUNTER — Other Ambulatory Visit: Payer: Self-pay | Admitting: Plastic Surgery

## 2012-05-25 NOTE — H&P (Signed)
Victoria Cooper is an 57 y.o. female.   Chief Complaint: back and shoulder pain secondary to significant bilateral mammary hypertrophy. [id number] has had back surgery in past, and above symptoms persist with additional breast hypertrophy status.  Past Medical History  Diagnosis Date  . Hypertension   . Animal bite 03/18/2011  . Asthma   . Arthritis   . Wears glasses     Past Surgical History  Procedure Date  . Back surgery 2007    lumb lam-fusion  . Abdominal hysterectomy 2003    hyst  . Colonoscopy     History reviewed. No pertinent family history. Social History:  reports that she has never smoked. She does not have any smokeless tobacco history on file. She reports that she drinks alcohol. She reports that she does not use illicit drugs.  Allergies:  Allergies  Allergen Reactions  . Morphine And Related Itching  . Sulfa Antibiotics Itching and Other (See Comments)    Itching in palms of hand and feet    No prescriptions prior to admission    No results found for this or any previous visit (from the past 48 hour(s)). No results found.  Review of Systems  Constitutional: Negative.   HENT: Positive for neck pain.   Eyes: Negative.   Respiratory: Negative.   Cardiovascular: Negative.   Gastrointestinal: Negative.   Genitourinary: Negative.   Musculoskeletal: Positive for back pain.  Skin: Negative.   Neurological: Negative.   Endo/Heme/Allergies: Negative.   Psychiatric/Behavioral: Negative.     Height 5\' 2"  (1.575 m), weight 70.308 kg (155 lb). 57 yo female with long standing back, shoulder and neck pain and significant bilateral mammary hypertrophy, admitted for bilateral reduction mammaplasty Physical Exam  Constitutional: She is oriented to person, place, and time. She appears well-nourished.  HENT:  Head: Normocephalic.  Eyes: Pupils are equal, round, and reactive to light.  Neck: Normal range of motion.  Cardiovascular: Normal rate, regular  rhythm and normal heart sounds.   Respiratory: Effort normal and breath sounds normal.  GI: Soft. Bowel sounds are normal.  Musculoskeletal: Normal range of motion.  Neurological: She is alert and oriented to person, place, and time.  Skin: Skin is warm and dry.  Psychiatric: She has a normal mood and affect.     Assessment/Plan PLAN BILATERAL REDUCTION MAMMAPLASTY  Vitaliy Eisenhour 05/25/2012, 2:06 PM

## 2012-05-26 ENCOUNTER — Encounter (HOSPITAL_BASED_OUTPATIENT_CLINIC_OR_DEPARTMENT_OTHER): Payer: Self-pay | Admitting: Certified Registered"

## 2012-05-26 ENCOUNTER — Ambulatory Visit (HOSPITAL_BASED_OUTPATIENT_CLINIC_OR_DEPARTMENT_OTHER): Payer: 59 | Admitting: Certified Registered"

## 2012-05-26 ENCOUNTER — Encounter (HOSPITAL_BASED_OUTPATIENT_CLINIC_OR_DEPARTMENT_OTHER)
Admission: RE | Disposition: A | Discharge status: Home / Self Care | Payer: Self-pay | Source: Ambulatory Visit | Attending: Plastic Surgery

## 2012-05-26 ENCOUNTER — Encounter (HOSPITAL_BASED_OUTPATIENT_CLINIC_OR_DEPARTMENT_OTHER): Payer: Self-pay

## 2012-05-26 ENCOUNTER — Ambulatory Visit (HOSPITAL_BASED_OUTPATIENT_CLINIC_OR_DEPARTMENT_OTHER)
Admission: RE | Admit: 2012-05-26 | Discharge: 2012-05-27 | Disposition: A | Discharge status: Home / Self Care | Payer: 59 | Source: Ambulatory Visit | Attending: Plastic Surgery | Admitting: Plastic Surgery

## 2012-05-26 DIAGNOSIS — Z01810 Encounter for preprocedural cardiovascular examination: Secondary | ICD-10-CM | POA: Insufficient documentation

## 2012-05-26 DIAGNOSIS — N62 Hypertrophy of breast: Secondary | ICD-10-CM | POA: Insufficient documentation

## 2012-05-26 DIAGNOSIS — I10 Essential (primary) hypertension: Secondary | ICD-10-CM | POA: Insufficient documentation

## 2012-05-26 HISTORY — DX: Presence of spectacles and contact lenses: Z97.3

## 2012-05-26 HISTORY — PX: BREAST REDUCTION SURGERY: SHX8

## 2012-05-26 HISTORY — DX: Unspecified asthma, uncomplicated: J45.909

## 2012-05-26 HISTORY — DX: Unspecified osteoarthritis, unspecified site: M19.90

## 2012-05-26 LAB — POCT HEMOGLOBIN-HEMACUE: Hemoglobin: 15.1 g/dL — ABNORMAL HIGH (ref 12.0–15.0)

## 2012-05-26 SURGERY — MAMMOPLASTY, REDUCTION
Anesthesia: General | Site: Breast | Laterality: Bilateral | Wound class: Clean

## 2012-05-26 MED ORDER — DEXTROSE IN LACTATED RINGERS 5 % IV SOLN
INTRAVENOUS | Status: AC
  Administered 2012-05-26: 12:00:00 via INTRAVENOUS

## 2012-05-26 MED ORDER — LIDOCAINE HCL (CARDIAC) 20 MG/ML IV SOLN
INTRAVENOUS | Status: DC | PRN
  Administered 2012-05-26: 50 mg via INTRAVENOUS

## 2012-05-26 MED ORDER — FENTANYL CITRATE 0.05 MG/ML IJ SOLN: 50.0000 ug | INTRAMUSCULAR | Status: DC | PRN

## 2012-05-26 MED ORDER — FENTANYL CITRATE 0.05 MG/ML IJ SOLN
INTRAMUSCULAR | Status: DC | PRN
  Administered 2012-05-26 (×2): 100 ug via INTRAVENOUS

## 2012-05-26 MED ORDER — ACETAMINOPHEN 650 MG RE SUPP: 650.0000 mg | Freq: Four times a day (QID) | RECTAL | Status: DC | PRN

## 2012-05-26 MED ORDER — ONDANSETRON HCL 4 MG/2ML IJ SOLN
INTRAMUSCULAR | Status: DC | PRN
  Administered 2012-05-26: 4 mg via INTRAVENOUS

## 2012-05-26 MED ORDER — PROPOFOL 10 MG/ML IV BOLUS
INTRAVENOUS | Status: DC | PRN
  Administered 2012-05-26: 130 mg via INTRAVENOUS
  Administered 2012-05-26: 70 mg via INTRAVENOUS
  Administered 2012-05-26: 100 mg via INTRAVENOUS

## 2012-05-26 MED ORDER — MIDAZOLAM HCL 5 MG/5ML IJ SOLN
INTRAMUSCULAR | Status: DC | PRN
  Administered 2012-05-26: 2 mg via INTRAVENOUS

## 2012-05-26 MED ORDER — OXYCODONE HCL 5 MG PO TABS: 5.0000 mg | ORAL_TABLET | Freq: Once | ORAL | Status: AC | PRN

## 2012-05-26 MED ORDER — HYDROMORPHONE HCL 2 MG PO TABS
2.0000 mg | ORAL_TABLET | ORAL | Status: DC | PRN
  Administered 2012-05-26 – 2012-05-27 (×4): 2 mg via ORAL

## 2012-05-26 MED ORDER — HYDROMORPHONE HCL PF 1 MG/ML IJ SOLN: 0.2500 mg | INTRAMUSCULAR | Status: DC | PRN

## 2012-05-26 MED ORDER — OXYCODONE HCL 5 MG/5ML PO SOLN: 5.0000 mg | Freq: Once | ORAL | Status: AC | PRN

## 2012-05-26 MED ORDER — DEXAMETHASONE SODIUM PHOSPHATE 4 MG/ML IJ SOLN
INTRAMUSCULAR | Status: DC | PRN
  Administered 2012-05-26: 8 mg via INTRAVENOUS

## 2012-05-26 MED ORDER — ACETAMINOPHEN 10 MG/ML IV SOLN
1000.0000 mg | Freq: Once | INTRAVENOUS | Status: AC
  Administered 2012-05-26: 1000 mg via INTRAVENOUS

## 2012-05-26 MED ORDER — CEFAZOLIN SODIUM-DEXTROSE 2-3 GM-% IV SOLR
2.0000 g | INTRAVENOUS | Status: AC
  Administered 2012-05-26: 2 g via INTRAVENOUS

## 2012-05-26 MED ORDER — EPHEDRINE SULFATE 50 MG/ML IJ SOLN
INTRAMUSCULAR | Status: DC | PRN
  Administered 2012-05-26 (×3): 10 mg via INTRAVENOUS

## 2012-05-26 MED ORDER — LACTATED RINGERS IV SOLN
INTRAVENOUS | Status: DC
  Administered 2012-05-26 (×4): via INTRAVENOUS

## 2012-05-26 MED ORDER — CEFAZOLIN SODIUM 1-5 GM-% IV SOLN
1.0000 g | Freq: Three times a day (TID) | INTRAVENOUS | Status: AC
  Administered 2012-05-26 (×2): 1 g via INTRAVENOUS

## 2012-05-26 MED ORDER — SUCCINYLCHOLINE CHLORIDE 20 MG/ML IJ SOLN
INTRAMUSCULAR | Status: DC | PRN
  Administered 2012-05-26: 100 mg via INTRAVENOUS

## 2012-05-26 MED ORDER — MIDAZOLAM HCL 2 MG/2ML IJ SOLN
1.0000 mg | INTRAMUSCULAR | Status: DC | PRN
Start: 2012-05-26 — End: 2012-05-26

## 2012-05-26 MED ORDER — ONDANSETRON HCL 4 MG/2ML IJ SOLN: 4.0000 mg | Freq: Four times a day (QID) | INTRAMUSCULAR | Status: DC | PRN

## 2012-05-26 MED ORDER — ACETAMINOPHEN 325 MG PO TABS: 650.0000 mg | ORAL_TABLET | Freq: Four times a day (QID) | ORAL | Status: DC | PRN

## 2012-05-26 MED ORDER — HYDROMORPHONE HCL PF 1 MG/ML IJ SOLN: 0.5000 mg | INTRAMUSCULAR | Status: DC | PRN

## 2012-05-26 SURGICAL SUPPLY — 45 items
BALL CTTN LRG ABS STRL LF (GAUZE/BANDAGES/DRESSINGS)
BANDAGE ELASTIC 6 VELCRO ST LF (GAUZE/BANDAGES/DRESSINGS) ×4 IMPLANT
BLADE SURG 10 STRL SS (BLADE) ×4 IMPLANT
BLADE SURG 15 STRL LF DISP TIS (BLADE) ×1 IMPLANT
BLADE SURG 15 STRL SS (BLADE) ×2
CANISTER SUCTION 1200CC (MISCELLANEOUS) ×2 IMPLANT
CHLORAPREP W/TINT 26ML (MISCELLANEOUS) IMPLANT
CLOTH BEACON ORANGE TIMEOUT ST (SAFETY) ×2 IMPLANT
COTTONBALL LRG STERILE PKG (GAUZE/BANDAGES/DRESSINGS) IMPLANT
COVER MAYO STAND STRL (DRAPES) ×2 IMPLANT
COVER TABLE BACK 60X90 (DRAPES) ×2 IMPLANT
DRAPE LAPAROSCOPIC ABDOMINAL (DRAPES) ×2 IMPLANT
DRSG PAD ABDOMINAL 8X10 ST (GAUZE/BANDAGES/DRESSINGS) ×8 IMPLANT
ELECT REM PT RETURN 9FT ADLT (ELECTROSURGICAL) ×2
ELECTRODE REM PT RTRN 9FT ADLT (ELECTROSURGICAL) ×1 IMPLANT
FILTER 7/8 IN (FILTER) ×2 IMPLANT
GAUZE SPONGE 4X4 12PLY STRL LF (GAUZE/BANDAGES/DRESSINGS) IMPLANT
GAUZE XEROFORM 5X9 LF (GAUZE/BANDAGES/DRESSINGS) ×4 IMPLANT
GLOVE BIO SURGEON STRL SZ 6.5 (GLOVE) IMPLANT
GLOVE BIO SURGEON STRL SZ7 (GLOVE) ×1 IMPLANT
GLOVE BIO SURGEON STRL SZ7.5 (GLOVE) IMPLANT
GLOVE BIO SURGEON STRL SZ8 (GLOVE) ×2 IMPLANT
GLOVE BIOGEL PI IND STRL 8 (GLOVE) ×1 IMPLANT
GLOVE BIOGEL PI INDICATOR 8 (GLOVE) ×1
GLOVE SURG ORTHO 8.0 STRL STRW (GLOVE) IMPLANT
GOWN PREVENTION PLUS XLARGE (GOWN DISPOSABLE) ×3 IMPLANT
GOWN PREVENTION PLUS XXLARGE (GOWN DISPOSABLE) ×1 IMPLANT
NS IRRIG 1000ML POUR BTL (IV SOLUTION) ×2 IMPLANT
PACK BASIN DAY SURGERY FS (CUSTOM PROCEDURE TRAY) ×2 IMPLANT
PAD EYE OVAL STERILE LF (GAUZE/BANDAGES/DRESSINGS) IMPLANT
SLEEVE SCD COMPRESS KNEE MED (MISCELLANEOUS) ×1 IMPLANT
SPECIMEN JAR X LARGE (MISCELLANEOUS) ×4 IMPLANT
SPONGE GAUZE 4X4 12PLY (GAUZE/BANDAGES/DRESSINGS) ×4 IMPLANT
SPONGE LAP 18X18 X RAY DECT (DISPOSABLE) ×8 IMPLANT
STRIP CLOSURE SKIN 1/2X4 (GAUZE/BANDAGES/DRESSINGS) ×8 IMPLANT
SUT MNCRL AB 3-0 PS2 18 (SUTURE) ×16 IMPLANT
SUT MNCRL AB 4-0 PS2 18 (SUTURE) ×8 IMPLANT
SUT VIC AB 2-0 PS2 27 (SUTURE) ×2 IMPLANT
SYR BULB IRRIGATION 50ML (SYRINGE) ×4 IMPLANT
TOWEL OR 17X24 6PK STRL BLUE (TOWEL DISPOSABLE) ×6 IMPLANT
TRAY FOLEY CATH 14FR (SET/KITS/TRAYS/PACK) IMPLANT
TUBE CONNECTING 20X1/4 (TUBING) ×2 IMPLANT
UNDERPAD 30X30 INCONTINENT (UNDERPADS AND DIAPERS) ×2 IMPLANT
VAC PENCILS W/TUBING CLEAR (MISCELLANEOUS) ×2 IMPLANT
YANKAUER SUCT BULB TIP NO VENT (SUCTIONS) ×2 IMPLANT

## 2012-05-26 NOTE — Op Note (Signed)
NAME:  Victoria Cooper, Victoria Cooper NO.:  [id number]  MEDICAL RECORD NO.:  [id number]  LOCATION:                                 FACILITY:  PHYSICIAN:  Consuello Bossier., M.D.DATE OF BIRTH:  July 17, 1955  DATE OF PROCEDURE:  05/26/2012 DATE OF DISCHARGE:                              OPERATIVE REPORT   PREOPERATIVE DIAGNOSIS:  Symptomatic bilateral mammary hypertrophy.  POSTOPERATIVE DIAGNOSIS:  Symptomatic bilateral mammary hypertrophy.  OPERATION:  Bilateral reduction mammoplasty.  SURGEON:  Pleas Dejai, M.D.  ASSISTANT:  Enedina Finner, RNFA.  ANESTHESIA:  General endotracheal.  FINDINGS:  The patient had symptomatic bilateral mammary hypertrophy with discomfort in her chest, upper shoulder, and back area.  She has had previous back surgery.  Bilateral reduction mammoplasty was performed removing approximately 1237 g of tissue from the tuberous.  PROCEDURE IN DETAIL:  The patient was brought to the operating room after being marked in the upright position for the planned surgical procedure.  She was given general endotracheal anesthetic.  Prepped with Betadine and draped both breasts in a sterile fashion.  Initially, the keyhole area was de-epithelialized, as was the inferior pedicle of a planned vertical bipedicle nipple areolar graft.  An inframammary incision was made and then continued up along the planned medial aspect of the vertical bipedicle graft.  Similar procedure was carried out laterally creating the pedicle graft.  A large triangular segment of full-thickness breast tissue was removed from the medial aspect and continuity with central segment leaving a 1 cm in depth superior pedicle.  Similar triangular segment was removed laterally, 631 g of tissue were removed from the left breast and 601 g of tissue from the right breast.  There was noted to be good hemostasis.  Wound was irrigated with normal saline.  Following this, medial and lateral  flaps were brought together to a predetermined position along the inframammary line with interrupted #2-0 Vicryl.  #3-0 Monocryl was then used to approximate the circumareolar and vertical and inframammary incisions in a subcuticular closure, followed by running #4-0 Monocryl closure.  Nipple color was excellent.  Steri-Strips, Xeroform, 4 x 8's, ABD, and cervicothoracic Ace bandage were applied.  The patient tolerated the procedure well, was able to be discharged from the operating room to the recovery room in satisfactory condition.     Consuello Bossier., M.D.     HH/MEDQ  D:  05/26/2012  T:  05/26/2012  Job:  [id number]

## 2012-05-26 NOTE — Anesthesia Postprocedure Evaluation (Signed)
  Anesthesia Post-op Note  Patient: Victoria Cooper  Procedure(s) Performed: Procedure(s) (LRB) with comments: MAMMARY REDUCTION  (BREAST) (Bilateral)  Patient Location: PACU  Anesthesia Type:General  Level of Consciousness: awake and alert   Airway and Oxygen Therapy: Patient Spontanous Breathing  Post-op Pain: mild  Post-op Assessment: Post-op Vital signs reviewed, Patient's Cardiovascular Status Stable, Respiratory Function Stable, Patent Airway and No signs of Nausea or vomiting  Post-op Vital Signs: Reviewed and stable  Complications: No apparent anesthesia complications

## 2012-05-26 NOTE — Op Note (Signed)
Operation: bilateral reduction mammaplasty Surgeon: Noah Lembke Ass't: B Cox, RNFA  Blood loss: c200 ml   Drains: none  Patient tolerated the procedure well, to PARR in satisfactory condition

## 2012-05-26 NOTE — Anesthesia Procedure Notes (Signed)
Procedure Name: Intubation Date/Time: 05/26/2012 7:51 AM Performed by: Verlan Friends Pre-anesthesia Checklist: Patient identified, Emergency Drugs available, Suction available, Patient being monitored and Timeout performed Patient Re-evaluated:Patient Re-evaluated prior to inductionOxygen Delivery Method: Circle System Utilized Preoxygenation: Pre-oxygenation with 100% oxygen Intubation Type: IV induction Ventilation: Mask ventilation without difficulty Laryngoscope Size: Miller and 2 Grade View: Grade I Tube type: Oral Tube size: 7.0 mm Number of attempts: 1 Airway Equipment and Method: stylet and oral airway Placement Confirmation: ETT inserted through vocal cords under direct vision,  positive ETCO2 and breath sounds checked- equal and bilateral Secured at: 21 cm Tube secured with: Tape Dental Injury: Teeth and Oropharynx as per pre-operative assessment

## 2012-05-26 NOTE — Interval H&P Note (Signed)
History and Physical Interval Note:  05/26/2012 7:43 AM  Victoria Cooper  has presented today for surgery, with the diagnosis of BILATERAL BREAST HYPER THROPHY   The various methods of treatment have been discussed with the patient and family. After consideration of risks, benefits and other options for treatment, the patient has consented to  Procedure(s) (LRB) with comments: MAMMARY REDUCTION  (BREAST) (Bilateral) as a surgical intervention .  The patient's history has been reviewed, patient examined, no change in status, stable for surgery.  I have reviewed the patient's chart and labs.  Questions were answered to the patient's satisfaction.   UPDATED TODAY - NO CHANGE  Burton Gahan

## 2012-05-26 NOTE — Transfer of Care (Signed)
Immediate Anesthesia Transfer of Care Note  Patient: Victoria Cooper  Procedure(s) Performed: Procedure(s) (LRB) with comments: MAMMARY REDUCTION  (BREAST) (Bilateral)  Patient Location: PACU  Anesthesia Type:General  Level of Consciousness: awake, alert , oriented and patient cooperative  Airway & Oxygen Therapy: Patient Spontanous Breathing and Patient connected to face mask oxygen  Post-op Assessment: Report given to PACU RN and Post -op Vital signs reviewed and stable  Post vital signs: Reviewed and stable  Complications: No apparent anesthesia complications

## 2012-05-26 NOTE — Anesthesia Preprocedure Evaluation (Signed)
Anesthesia Evaluation  Patient identified by MRN, date of birth, ID band Patient awake    Reviewed: Allergy & Precautions, H&P , NPO status , Patient's Chart, lab work & pertinent test results  Airway Mallampati: II TM Distance: >3 FB Neck ROM: Full    Dental No notable dental hx. (+) Teeth Intact and Dental Advisory Given   Pulmonary asthma ,  breath sounds clear to auscultation  Pulmonary exam normal       Cardiovascular hypertension, On Medications Rhythm:Regular Rate:Normal     Neuro/Psych negative neurological ROS  negative psych ROS   GI/Hepatic negative GI ROS, Neg liver ROS,   Endo/Other  negative endocrine ROS  Renal/GU negative Renal ROS  negative genitourinary   Musculoskeletal   Abdominal   Peds  Hematology negative hematology ROS (+)   Anesthesia Other Findings   Reproductive/Obstetrics negative OB ROS                           Anesthesia Physical Anesthesia Plan  ASA: II  Anesthesia Plan: General   Post-op Pain Management:    Induction: Intravenous  Airway Management Planned: Oral ETT  Additional Equipment:   Intra-op Plan:   Post-operative Plan: Extubation in OR  Informed Consent: I have reviewed the patients History and Physical, chart, labs and discussed the procedure including the risks, benefits and alternatives for the proposed anesthesia with the patient or authorized representative who has indicated his/her understanding and acceptance.   Dental advisory given  Plan Discussed with: CRNA  Anesthesia Plan Comments:         Anesthesia Quick Evaluation

## 2012-05-27 ENCOUNTER — Encounter (HOSPITAL_BASED_OUTPATIENT_CLINIC_OR_DEPARTMENT_OTHER): Payer: Self-pay | Admitting: Plastic Surgery

## 2013-02-26 ENCOUNTER — Telehealth: Payer: Self-pay | Admitting: Critical Care Medicine

## 2013-02-26 NOTE — Telephone Encounter (Signed)
Spoke with pt--  Aware that Dr Delford Field is not taking on new patients at this time in the [place] location but that she can be added in the [place] location. Pt lives in [place] and works as a TEFL teacher in the [employer] and [place] is more convenient for her. She is specifically requesting to see Dr Delford Field and [place] location if at all possible.  Pt states that she is familiar with Dr Florene Route work and wants to take him on to cover her care.  Med Hx FYI: Has seen Dr [place] Callas in past for asthma/allergies and patient has chronic recurrent sinusitis.   Please advise Dr Delford Field if will be able to accept this patient in the [place] office as a self referral Pulmonary Consult? Thanks.

## 2013-02-27 NOTE — Telephone Encounter (Signed)
i am not taking on new cases in [place].  Refer her to another provider

## 2013-03-01 NOTE — Telephone Encounter (Signed)
Pt advised and appt set for Friday at 11:45 with MW. Carron Curie, CMA

## 2013-03-05 ENCOUNTER — Encounter: Payer: Self-pay | Admitting: Internal Medicine

## 2013-03-05 ENCOUNTER — Ambulatory Visit (INDEPENDENT_AMBULATORY_CARE_PROVIDER_SITE_OTHER)
Admission: RE | Admit: 2013-03-05 | Discharge: 2013-03-05 | Disposition: A | Discharge status: Home / Self Care | Payer: 59 | Source: Ambulatory Visit | Attending: Internal Medicine | Admitting: Internal Medicine

## 2013-03-05 ENCOUNTER — Ambulatory Visit (INDEPENDENT_AMBULATORY_CARE_PROVIDER_SITE_OTHER): Payer: 59 | Admitting: Internal Medicine

## 2013-03-05 ENCOUNTER — Telehealth: Payer: Self-pay | Admitting: Internal Medicine

## 2013-03-05 VITALS — BP 124/80 | HR 76 | Temp 97.9°F | Ht 61.5 in | Wt 158.0 lb

## 2013-03-05 DIAGNOSIS — J45909 Unspecified asthma, uncomplicated: Secondary | ICD-10-CM | POA: Insufficient documentation

## 2013-03-05 MED ORDER — PREDNISONE (PAK) 10 MG PO TABS: ORAL_TABLET | ORAL | Status: DC

## 2013-03-05 NOTE — Progress Notes (Signed)
Subjective:    Patient ID: Victoria Cooper, female    DOB: Jun 11, 1955    MRN: [id number]  HPI  30 yowf former med tech now chaplain at [employer]  never smoker with onset of breathing problems in her late 52s p pneumonia nasal and chest congestion treated with allergy shots but not better and continued freq flares of coughing and sob and so saw [place] Callas around 2006 who dx'd asthma but only 50% better on symbicort but worse off it and usually only 100% while on prednisone self referred to pulmonary clinic 03/05/2013    03/05/2013 [facility] office visit/ Victoria Cooper  Chief Complaint  Patient presents with  . Pulmonary Consult    Self referral. Pt states dxed with asthma approx 8 yrs ago. She states that she has had two respiratory infections in the past year. She c/o cough and SOB for the past couple of months. On a bad day gets SOB just at rest.   cc daily chest and sinus congestion with variably green drainage and sob x running always but occ at rest assoc with chest tightness / subjective wheezing. Already eval by Suszanne Conners ? Dx hfa only about 50 % effective at baseline and using prilosec at hs. avg use of saba up to 4 x daily.   No obvious pattern in  day to day or daytime variably  (flares can happen middle of summer or winter)  or assoc  cp. No unusual exp hx or h/o childhood pna/ asthma or knowledge of premature birth.  Sleeping ok at present  without nocturnal  or early am exacerbation  of respiratory  c/o's or need for noct saba. Also denies any obvious fluctuation of symptoms with weather or environmental changes or other aggravating or alleviating factors except as outlined above   Current Medications, Allergies, Complete Past Medical History, Past Surgical History, Family History, and Social History were reviewed in [employer] record.          Review of Systems  Constitutional: Negative for fever, chills and unexpected weight change.  HENT: Positive for  congestion, ear pain and sneezing. Negative for dental problem, nosebleeds, postnasal drip, rhinorrhea, sinus pressure, sore throat, trouble swallowing and voice change.   Eyes: Negative for visual disturbance.  Respiratory: Positive for cough and shortness of breath. Negative for choking.   Cardiovascular: Negative for chest pain and leg swelling.  Gastrointestinal: Negative for vomiting, abdominal pain and diarrhea.  Genitourinary: Negative for difficulty urinating.  Musculoskeletal: Negative for arthralgias.  Skin: Negative for rash.  Neurological: Positive for headaches. Negative for tremors and syncope.  Hematological: Does not bruise/bleed easily.       Objective:   Physical Exam  Wt Readings from Last 3 Encounters:  03/05/13 158 lb (71.668 kg)  05/26/12 157 lb (71.215 kg)  05/26/12 157 lb (71.215 kg)     amb wf nad  HEENT: nl dentition, turbinates, and orophanx. Nl external ear canals without cough reflex   NECK :  without JVD/Nodes/TM/ nl carotid upstrokes bilaterally   LUNGS: no acc muscle use, clear to A and P bilaterally without cough on insp or exp maneuvers   CV:  RRR  no s3 or murmur or increase in P2, no edema   ABD:  soft and nontender with nl excursion in the supine position. No bruits or organomegaly, bowel sounds nl  MS:  warm without deformities, calf tenderness, cyanosis or clubbing  SKIN: warm and dry without lesions    NEURO:  alert,  approp, no deficits    CXR  03/05/2013 :    No active cardiopulmonary disease.      Assessment & Plan:

## 2013-03-05 NOTE — Telephone Encounter (Signed)
Result Note     Call pt: Reviewed cxr and no acute change so no change in recommendations made at ov  --  I spoke with patient about results and she verbalized understanding and had no questions 

## 2013-03-05 NOTE — Assessment & Plan Note (Addendum)
DDX of  difficult airways managment all start with A and  include Adherence, Ace Inhibitors, Acid Reflux, Active Sinus Disease, Alpha 1 Antitripsin deficiency, Anxiety masquerading as Airways dz,  ABPA,  allergy(esp in young), Aspiration (esp in elderly), Adverse effects of DPI,  Active smokers, plus two Bs  = Bronchiectasis and Beta blocker use..and one C= CHF  Adherence is always the initial "prime suspect" and is a multilayered concern that requires a "trust but verify" approach in every patient - starting with knowing how to use medications, especially inhalers, correctly, keeping up with refills and understanding the fundamental difference between maintenance and prns vs those medications only taken for a very short course and then stopped and not refilled.  - The proper method of use, as well as anticipated side effects, of a metered-dose inhaler are discussed and demonstrated to the patient. Improved effectiveness after extensive coaching during this visit to a level of approximately  75% from baseline of < 25% so continue either symbicort 80 or 100 2bid until return then consider escalate to higher dose  ? Active sinus dz > will review Dr Avel Sensor eval  ? Acid (or non-acid) GERD > always difficult to exclude as up to 75% of pts in some series report no assoc GI/ Heartburn symptoms> rec max (24h)  acid suppression and diet restrictions/ reviewed and instructions given in writting   Allergies >  chronologic pattern in terms of flares is not entirely consistent with allergic mechanism and I rec she hold off on committing to longterm rx with allergy shots until we have a chance to work with her to sort out the cause of her flares

## 2013-03-05 NOTE — Patient Instructions (Addendum)
Dulera or symbicort Take 2 puffs first thing in am and then another 2 puffs about 12 hours later.   Work on inhaler technique:  relax and gently blow all the way out then take a nice smooth deep breath back in, triggering the inhaler at same time you start breathing in.  Hold for up to 5 seconds if you can.  Rinse and gargle with water when done  Prednisone 10 mg take  4 each am x 2 days,   2 each am x 2 days,  1 each am x 2 days and stop   Only use your albuterol (ventolin) as a rescue medication to be used if you can't catch your breath by resting or doing a relaxed purse lip breathing pattern.  - The less you use it, the better it will work when you need it. - Ok to use up to 2 puffs every 4 hours if you must but call for immediate appointment if use goes up over your usual need - Don't leave home without it !!  (think of it like your spare tire for your car)   I will get Dr Avel Sensor last note to see whether your sinuses are contributing to your problem   GERD (REFLUX)  is an extremely common cause of respiratory symptoms, many times with no significant heartburn at all.    It can be treated with medication, but also with lifestyle changes including avoidance of late meals, excessive alcohol, smoking cessation, and avoid fatty foods, chocolate, peppermint, colas, red wine, and acidic juices such as orange juice.  NO MINT OR MENTHOL PRODUCTS SO NO COUGH DROPS  USE SUGARLESS CANDY INSTEAD (jolley ranchers or Stover's)  NO OIL BASED VITAMINS - use powdered substitutes. Stop fish oil   Please remember to go to the xray  department downstairs for your tests - we will call you with the results when they are available.     Please schedule a follow up office visit in 4 weeks, sooner if needed with PFTs same day

## 2013-04-06 ENCOUNTER — Other Ambulatory Visit: Payer: Self-pay | Admitting: Internal Medicine

## 2013-04-06 DIAGNOSIS — J45909 Unspecified asthma, uncomplicated: Secondary | ICD-10-CM

## 2013-04-07 ENCOUNTER — Ambulatory Visit (INDEPENDENT_AMBULATORY_CARE_PROVIDER_SITE_OTHER): Payer: 59 | Admitting: Internal Medicine

## 2013-04-07 ENCOUNTER — Encounter: Payer: Self-pay | Admitting: Internal Medicine

## 2013-04-07 ENCOUNTER — Encounter (INDEPENDENT_AMBULATORY_CARE_PROVIDER_SITE_OTHER): Payer: Self-pay

## 2013-04-07 VITALS — BP 112/80 | HR 90 | Temp 98.1°F | Ht 61.0 in | Wt 162.0 lb

## 2013-04-07 DIAGNOSIS — J45909 Unspecified asthma, uncomplicated: Secondary | ICD-10-CM

## 2013-04-07 DIAGNOSIS — R058 Other specified cough: Secondary | ICD-10-CM | POA: Insufficient documentation

## 2013-04-07 DIAGNOSIS — I10 Essential (primary) hypertension: Secondary | ICD-10-CM

## 2013-04-07 DIAGNOSIS — R05 Cough: Secondary | ICD-10-CM | POA: Insufficient documentation

## 2013-04-07 LAB — PULMONARY FUNCTION TEST
DL/VA % pred: 118 %
DL/VA: 5.21 ml/min/mmHg/L
DLCO unc % pred: 124 %
DLCO unc: 25.1 ml/min/mmHg
FEF 25-75 Post: 3.61 L/sec
FEF 25-75 Pre: 3.38 L/sec
FEF2575-%Change-Post: 6 %
FEF2575-%Pred-Post: 153 %
FEF2575-%Pred-Pre: 143 %
FEV1-%Change-Post: 1 %
FEV1-%Pred-Post: 112 %
FEV1-%Pred-Pre: 110 %
FEV1-Pre: 2.63 L
FEV1FVC-%Pred-Pre: 108 %
FEV6-%Pred-Post: 103 %
FEV6-Pre: 3.08 L
FEV6FVC-%Pred-Post: 103 %
FEV6FVC-%Pred-Pre: 103 %
FVC-%Change-Post: 0 %
FVC-%Pred-Post: 100 %
FVC-Post: 3.07 L
FVC-Pre: 3.08 L
Post FEV1/FVC ratio: 87 %
Pre FEV1/FVC ratio: 86 %
Pre FEV6/FVC Ratio: 100 %
RV: 1.72 L
TLC % pred: 108 %

## 2013-04-07 MED ORDER — FAMOTIDINE 20 MG PO TABS: ORAL_TABLET | ORAL | Status: DC

## 2013-04-07 MED ORDER — BENZONATATE 200 MG PO CAPS: ORAL_CAPSULE | ORAL | Status: DC

## 2013-04-07 MED ORDER — PREDNISONE (PAK) 10 MG PO TABS: ORAL_TABLET | ORAL | Status: DC

## 2013-04-07 MED ORDER — OLMESARTAN MEDOXOMIL-HCTZ 20-12.5 MG PO TABS: 1.0000 | ORAL_TABLET | Freq: Every day | ORAL | Status: DC

## 2013-04-07 MED ORDER — PANTOPRAZOLE SODIUM 40 MG PO TBEC: 40.0000 mg | DELAYED_RELEASE_TABLET | Freq: Every day | ORAL | Status: DC

## 2013-04-07 NOTE — Progress Notes (Signed)
PFT done. Mayla Biddy, CMA  

## 2013-04-07 NOTE — Patient Instructions (Addendum)
Try off symbicort/dulera to what effect it has and whether you sense you need more albuterol  We will need to have you sign release for Dr Avel Sensor office  Pantoprazole (protonix) 40 mg  Take 30-60 min before first meal of the day and Pepcid 20 mg one bedtime (plus chlortrimeton 4 mg if nose dripping again)  until return to office - this is the best way to tell whether stomach acid is contributing to your problem.    GERD (REFLUX)  is an extremely common cause of respiratory symptoms, many times with no significant heartburn at all.    It can be treated with medication, but also with lifestyle changes including avoidance of late meals, excessive alcohol, smoking cessation, and avoid fatty foods, chocolate, peppermint, colas, red wine, and acidic juices such as orange juice.  NO MINT OR MENTHOL PRODUCTS SO NO COUGH DROPS  USE SUGARLESS CANDY INSTEAD (jolley ranchers or Stover's)  NO OIL BASED VITAMINS - use powdered substitutes.  Tessalon pearls can be used to stop the throat scratching  Prednisone 10 mg take  4 each am x 2 days,   2 each am x 2 days,  1 each am x 2 days and stop if get worse  Please schedule a follow up office visit in 4 weeks, sooner if needed  Late add d/c cozar and rx benicar 20/12.5 one daily samples

## 2013-04-07 NOTE — Assessment & Plan Note (Signed)
Changed cozar to benicar 20-12.5 one daily 04/07/2013 due to ? Cough

## 2013-04-07 NOTE — Assessment & Plan Note (Signed)
-   hfa 75% 03/05/2013  - pft's completely wnl 04/07/2013 off all inhalers x 24 h (including fef 25-75)  Not clear to me at all this is asthma - see upper airway cough syndrome and continue off maint rx for now

## 2013-04-07 NOTE — Progress Notes (Signed)
Subjective:    Patient ID: Victoria Cooper, female    DOB: 06/14/1955    MRN: [id number]    Brief patient profile:  67 yowf former med tech now chaplain at [employer]  never smoker with onset of breathing problems in her late 34s p pneumonia nasal and chest congestion treated with allergy shots but not better and continued freq flares of coughing and sob and so saw Vernon Callas around 2006 who dx'd asthma but only 50% better on symbicort but worse off it and usually only 100% while on prednisone self referred to pulmonary clinic 03/05/2013    History of Present Illness  03/05/2013 1st [facility] office visit/ Louiza Moor  Chief Complaint  Patient presents with  . Pulmonary Consult    Self referral. Pt states dxed with asthma approx 8 yrs ago. She states that she has had two respiratory infections in the past year. She c/o cough and SOB for the past couple of months. On a bad day gets SOB just at rest.   cc daily chest and sinus congestion with variably green drainage and sob x running always but occ at rest assoc with chest tightness / subjective wheezing. Already eval by Suszanne Conners ? Dx hfa only about 50 % effective at baseline and using prilosec at hs. avg use of saba up to 4 x daily.  rec Dulera or symbicort Take 2 puffs first thing in am and then another 2 puffs about 12 hours later.  Work on inhaler technique:  relax and gently blow all the way out then take a nice smooth deep breath back in, triggering the inhaler at same time you start breathing in.  Hold for up to 5 seconds if you can.  Rinse and gargle with water when done Prednisone 10 mg take  4 each am x 2 days,   2 each am x 2 days,  1 each am x 2 days and stop  Only use your albuterol (ventolin) as a rescue medication  get Dr Avel Sensor last note to see whether your sinuses are contributing to your problem  GERD diet   04/07/2013 f/u ov/Kasin Tonkinson re: unexplained cough and sob / congested cough  Chief Complaint  Patient presents with  . Follow-up     Pt states had URI shortly after last visit and this is starting to improve. Breathing is unchanged sicne the last visit. She has used rescue inhaler x 3 in the past wk.   last symbicort / dulera 48 h no worse off it Last prednisone completed Nov 14  Sense of throat drainage does not keep her up  maint on singulair  On maint rx with cozar, xyzal doesn't seem to have an impact on her symptoms   No obvious pattern in day to day or daytime variabilty or assoc chronic cough or cp or chest tightness, subjective wheeze overt sinus or hb symptoms. No unusual exp hx or h/o childhood pna/ asthma or knowledge of premature birth.  Sleeping ok without nocturnal  or early am exacerbation  of respiratory  c/o's or need for noct saba. Also denies any obvious fluctuation of symptoms with weather or environmental changes or other aggravating or alleviating factors except as outlined above   Current Medications, Allergies, Complete Past Medical History, Past Surgical History, Family History, and Social History were reviewed in [employer] record.  ROS  The following are not active complaints unless bolded sore throat, dysphagia, dental problems, itching, sneezing,  nasal congestion or excess/ purulent secretions, ear  ache,   fever, chills, sweats, unintended wt loss, pleuritic or exertional cp, hemoptysis,  orthopnea pnd or leg swelling, presyncope, palpitations, heartburn, abdominal pain, anorexia, nausea, vomiting, diarrhea  or change in bowel or urinary habits, change in stools or urine, dysuria,hematuria,  rash, arthralgias, visual complaints, headache, numbness weakness or ataxia or problems with walking or coordination,  change in mood/affect or memory.                       Objective:   Physical Exam  04/07/2013      162 Wt Readings from Last 3 Encounters:  03/05/13 158 lb (71.668 kg)  05/26/12 157 lb (71.215 kg)  05/26/12 157 lb (71.215 kg)     amb wf nad  HEENT:  nl dentition, turbinates, and orophanx. Nl external ear canals without cough reflex   NECK :  without JVD/Nodes/TM/ nl carotid upstrokes bilaterally   LUNGS: no acc muscle use, clear to A and P bilaterally without cough on insp or exp maneuvers   CV:  RRR  no s3 or murmur or increase in P2, no edema   ABD:  soft and nontender with nl excursion in the supine position. No bruits or organomegaly, bowel sounds nl  MS:  warm without deformities, calf tenderness, cyanosis or clubbing  SKIN: warm and dry without lesions    NEURO:  alert, approp, no deficits       CXR  03/05/2013 :    No active cardiopulmonary disease.      Assessment & Plan:

## 2013-04-07 NOTE — Assessment & Plan Note (Addendum)
Classic Upper airway cough syndrome, so named because it's frequently impossible to sort out how much is  CR/sinusitis with freq throat clearing (which can be related to primary GERD)   vs  causing  secondary (" extra esophageal")  GERD from wide swings in gastric pressure that occur with throat clearing, often  promoting self use of mint and menthol lozenges that reduce the lower esophageal sphincter tone and exacerbate the problem further in a cyclical fashion.   These are the same pts (now being labeled as having "irritable larynx syndrome" by some cough centers) who not infrequently have a history of having failed to tolerate ace inhibitors,  dry powder inhalers or biphosphonates or report having atypical reflux symptoms that don't respond to standard doses of PPI , and are easily confused as having aecopd or asthma flares by even experienced allergists/ pulmonologists.  For now max rx for gerd and pnds with 1st gen h1 and regroup in 4 weeks

## 2013-04-15 ENCOUNTER — Telehealth: Payer: Self-pay | Admitting: Internal Medicine

## 2013-04-15 NOTE — Telephone Encounter (Signed)
Received 6 pages from [facility] Philomena Doheny M.D. P.A., sent to Dr. Sherene Sires. 04/14/13/ss.

## 2013-04-16 ENCOUNTER — Encounter: Payer: Self-pay | Admitting: Internal Medicine

## 2013-04-29 DIAGNOSIS — M359 Systemic involvement of connective tissue, unspecified: Secondary | ICD-10-CM

## 2013-04-29 HISTORY — DX: Systemic involvement of connective tissue, unspecified: M35.9

## 2013-05-05 ENCOUNTER — Encounter: Payer: Self-pay | Admitting: Internal Medicine

## 2013-05-05 ENCOUNTER — Ambulatory Visit (INDEPENDENT_AMBULATORY_CARE_PROVIDER_SITE_OTHER): Payer: 59 | Admitting: Internal Medicine

## 2013-05-05 VITALS — BP 100/60 | HR 95 | Temp 98.1°F | Ht 62.0 in | Wt 163.0 lb

## 2013-05-05 DIAGNOSIS — J45909 Unspecified asthma, uncomplicated: Secondary | ICD-10-CM

## 2013-05-05 DIAGNOSIS — R058 Other specified cough: Secondary | ICD-10-CM

## 2013-05-05 DIAGNOSIS — R05 Cough: Secondary | ICD-10-CM

## 2013-05-05 DIAGNOSIS — R059 Cough, unspecified: Secondary | ICD-10-CM

## 2013-05-05 DIAGNOSIS — I10 Essential (primary) hypertension: Secondary | ICD-10-CM

## 2013-05-05 MED ORDER — VALSARTAN-HYDROCHLOROTHIAZIDE 160-12.5 MG PO TABS: 1.0000 | ORAL_TABLET | Freq: Every day | ORAL | Status: DC

## 2013-05-05 NOTE — Patient Instructions (Addendum)
Start back on singulair each evening and if your nasal symptoms continue  to bother you, Dr Donneta Romberg is your best bet Continue dymista twice daily  Pantoprazole (protonix) 40 mg   Take 30-60 min before first meal of the day and Pepcid 20 mg one bedtime until  All symptoms are excellent control x week then ok to try off to see what if any symptoms flare  Stop benicar Start diovan160/12.5 one daily    If you are satisfied with your treatment plan let your doctor know and he/she can either refill your medications or you can return here when your prescription runs out.     If in any way you are not 100% satisfied,  please tell us.  If 100% better, tell your friends!

## 2013-05-05 NOTE — Progress Notes (Signed)
Subjective:    Patient ID: Victoria Cooper, female    DOB: 1955/10/18    MRN: [id number]    Brief patient profile:   64 yowf former med tech now chaplain at [employer]  never smoker with onset of breathing problems in her late 51s p pneumonia nasal and chest congestion treated with allergy shots but not better and continued freq flares of coughing and sob and so saw Victoria Cooper around 2006 who dx'd asthma but only 50% better on symbicort but worse off it and usually only 100% while on prednisone self referred to pulmonary clinic 03/05/2013    History of Present Illness  03/05/2013 1st Victoria Cooper/ Victoria Cooper  Chief Complaint  Patient presents with  . Pulmonary Consult    Self referral. Pt states dxed with asthma approx 8 yrs ago. She states that she has had two respiratory infections in the past year. She c/o cough and SOB for the past couple of months. On a bad day gets SOB just at rest.   cc daily chest and sinus congestion with variably green drainage and sob x running always but occ at rest assoc with chest tightness / subjective wheezing. Already eval by Victoria Cooper ? Dx hfa only about 50 % effective at baseline and using prilosec at hs. avg use of saba up to 4 x daily.  rec Dulera or symbicort Take 2 puffs first thing in am and then another 2 puffs about 12 hours later.  Work on inhaler technique:  relax and gently blow all the way out then take a nice smooth deep breath back in, triggering the inhaler at same time you start breathing in.  Hold for up to 5 seconds if you can.  Rinse and gargle with water when done Prednisone 10 mg take  4 each am x 2 days,   2 each am x 2 days,  1 each am x 2 days and stop  Only use your albuterol (ventolin) as a rescue medication  get Victoria Cooper last note to see whether your sinuses are contributing to your problem  GERD diet   04/07/2013 f/u ov/Victoria Cooper re: unexplained cough and sob / congested cough  Chief Complaint  Patient presents with  . Follow-up     Pt states had URI shortly after last Cooper and this is starting to improve. Breathing is unchanged sicne the last Cooper. She has used rescue inhaler x 3 in the past wk.   last symbicort / dulera 48 h no worse off it Last prednisone completed Nov 14  Sense of throat drainage does not keep her up  maint on singulair  On maint rx with cozar, xyzal doesn't seem to have an impact on her symptoms rec Try off symbicort/dulera to what effect it has and whether you sense you need more albuterol We will need to have you sign release for Victoria Cooper office Pantoprazole (protonix) 40 mg  Take 30-60 min before first meal of the day and Pepcid 20 mg one bedtime (plus chlortrimeton 4 mg if nose dripping again)  until return to office - this is the best way to tell whether stomach acid is contributing to your problem.   GERD   Tessalon pearls can be used to stop the throat scratching Prednisone 10 mg take  4 each am x 2 days,   2 each am x 2 days,  1 each am x 2 days and stop if get worse Please schedule a follow up office Cooper in 4 weeks,  sooner if needed  Late add d/c cozar and rx benicar 20/12.5 one daily samples    05/05/2013 f/u ov/Victoria Cooper re: chronic cough Chief Complaint  Patient presents with  . Follow-up    Cough has improved over the past wk. She c/o nasal congestion.   misunderstood and left off singulair but continued on dymista has lots of nasal congestion but cough overall better. No need for saba day or noct   No obvious pattern in day to day or daytime variabilty or assoc sob , cp or chest tightness, subjective wheeze overt sinus or hb symptoms. No unusual exp hx or h/o childhood pna/ asthma or knowledge of premature birth.  Sleeping ok without nocturnal  or early am exacerbation  of respiratory  c/o's or need for noct saba. Also denies any obvious fluctuation of symptoms with weather or environmental changes or other aggravating or alleviating factors except as outlined above   Current  Medications, Allergies, Complete Past Medical History, Past Surgical History, Family History, and Social History were reviewed in [employer] record.  ROS  The following are not active complaints unless bolded sore throat, dysphagia, dental problems, itching, sneezing,  nasal congestion or excess/ purulent secretions, ear ache,   fever, chills, sweats, unintended wt loss, pleuritic or exertional cp, hemoptysis,  orthopnea pnd or leg swelling, presyncope, palpitations, heartburn, abdominal pain, anorexia, nausea, vomiting, diarrhea  or change in bowel or urinary habits, change in stools or urine, dysuria,hematuria,  rash, arthralgias, visual complaints, headache, numbness weakness or ataxia or problems with walking or coordination,  change in mood/affect or memory.                       Objective:   Physical Exam  04/07/2013      162 > 05/05/2013  163 Wt Readings from Last 3 Encounters:  03/05/13 158 lb (71.668 kg)  05/26/12 157 lb (71.215 kg)  05/26/12 157 lb (71.215 kg)     amb wf nad  HEENT: nl dentition, turbinates, and orophanx. Nl external ear canals without cough reflex   NECK :  without JVD/Nodes/TM/ nl carotid upstrokes bilaterally   LUNGS: no acc muscle use, clear to A and P bilaterally without cough on insp or exp maneuvers   CV:  RRR  no s3 or murmur or increase in P2, no edema   ABD:  soft and nontender with nl excursion in the supine position. No bruits or organomegaly, bowel sounds nl  MS:  warm without deformities, calf tenderness, cyanosis or clubbing  SKIN: warm and dry without lesions    NEURO:  alert, approp, no deficits       CXR  03/05/2013 :    No active cardiopulmonary disease.      Assessment & Plan:

## 2013-05-07 NOTE — Assessment & Plan Note (Signed)
Changed cozar to benicar 20-12.5 one daily 04/07/2013 due to ? Cough  - changed to diovan 160-12.5 one daily due to insurance

## 2013-05-07 NOTE — Assessment & Plan Note (Signed)
-   ENT eval [facility]  July 04 2011 c/w GERD by laryngoscopy - trial off ics 04/07/2013 > better 05/05/13 so leave off ics/ restart singulair

## 2013-05-07 NOTE — Assessment & Plan Note (Signed)
-   hfa 75% 03/05/2013  - pft's completely wnl 04/07/2013 off all inhalers x 24 h (including fef 25-75)> rec leave off all inhalers  Still not clear whether there is asthma present so rec max rx directed at gerd and rhinitis and then MCT if dx remains in doubt

## 2014-02-17 ENCOUNTER — Encounter (HOSPITAL_COMMUNITY): Payer: Self-pay | Admitting: Emergency Medicine

## 2014-02-17 ENCOUNTER — Emergency Department (HOSPITAL_COMMUNITY)
Admission: EM | Admit: 2014-02-17 | Discharge: 2014-02-17 | Disposition: A | Discharge status: Home / Self Care | Payer: 59 | Source: Home / Self Care | Attending: Family Medicine | Admitting: Family Medicine

## 2014-02-17 DIAGNOSIS — J029 Acute pharyngitis, unspecified: Secondary | ICD-10-CM

## 2014-02-17 LAB — POCT RAPID STREP A: Streptococcus, Group A Screen (Direct): NEGATIVE

## 2014-02-17 MED ORDER — IPRATROPIUM BROMIDE 0.06 % NA SOLN: 2.0000 | Freq: Four times a day (QID) | NASAL | Status: DC

## 2014-02-17 MED ORDER — PREDNISOLONE SODIUM PHOSPHATE 15 MG/5ML PO SOLN: 30.0000 mg | Freq: Every day | ORAL | Status: AC

## 2014-02-17 NOTE — ED Provider Notes (Signed)
Victoria Cooper is a 58 y.o. female who presents to Urgent Care today for sore throat. Patient has had a mild sore throat off and on for the past week. This morning she awoke with a severe sore throat and cough some blood-tinged sputum. She notes pain with swallowing but denies any trouble swallowing or trouble breathing. She has a sensation that there is something stuck in her throat when she is swallowing but is able to swallow completely solids and liquids. Patient notes that she did not take her Plaquenil nor her blood pressure medication this morning.   Past Medical History  Diagnosis Date  . Hypertension   . Animal bite 03/18/2011  . Asthma   . Arthritis   . Wears glasses    History  Substance Use Topics  . Smoking status: Never Smoker   . Smokeless tobacco: Not on file  . Alcohol Use: Yes     Comment: occ   ROS as above Medications: No current facility-administered medications for this encounter.   Current Outpatient Prescriptions  Medication Sig Dispense Refill  . hydroxychloroquine (PLAQUENIL) 200 MG tablet Take by mouth daily.      . valsartan-hydrochlorothiazide (DIOVAN HCT) 160-12.5 MG per tablet Take 1 tablet by mouth daily.  30 tablet  11  . albuterol (VENTOLIN HFA) 108 (90 BASE) MCG/ACT inhaler Inhale 2 puffs into the lungs every 6 (six) hours as needed for wheezing or shortness of breath.      . Ascorbic Acid (VITAMIN C PO) Take 1 tablet by mouth daily.      . Azelastine-Fluticasone (DYMISTA) 137-50 MCG/ACT SUSP Place 1 spray into the nose 2 (two) times daily.      . benzonatate (TESSALON) 200 MG capsule One four times daily as needed for cough  40 capsule  2  . Cholecalciferol (VITAMIN D PO) Take 1 tablet by mouth daily.      . famotidine (PEPCID) 20 MG tablet One at bedtime  30 tablet  2  . ipratropium (ATROVENT) 0.06 % nasal spray Place 2 sprays into both nostrils 4 (four) times daily.  15 mL  1  . MAGNESIUM PO Take 1 tablet by mouth daily.      . montelukast  (SINGULAIR) 10 MG tablet Take 10 mg by mouth daily.      . pantoprazole (PROTONIX) 40 MG tablet Take 1 tablet (40 mg total) by mouth daily. Take 30-60 min before first meal of the day  30 tablet  2  . prednisoLONE (ORAPRED) 15 MG/5ML solution Take 10 mLs (30 mg total) by mouth daily. 5 days  100 mL  0    Exam:  BP 163/73  Pulse 84  Temp(Src) 98.2 F (36.8 C) (Oral)  Resp 16  SpO2 99% Gen: Well NAD HEENT: EOMI,  MMM posterior fractures erythematous but no swelling noted. Uvula is midline. Normal uvula motion. No trismus noted. Normal tympanic membranes bilaterally. Lungs: Normal work of breathing. CTABL Heart: RRR no MRG Abd: NABS, Soft. Nondistended, Nontender Exts: Brisk capillary refill, warm and well perfused.   Results for orders placed during the hospital encounter of 02/17/14 (from the past 24 hour(s))  POCT RAPID STREP A ([place])     Status: None   Collection Time    02/17/14  8:41 AM      Result Value Ref Range   Streptococcus, Group A Screen (Direct) NEGATIVE  NEGATIVE   No results found.  Assessment and Plan: 58 y.o. female with pharyngitis likely viral. Culture  pending. Treatment with Orapred and Atrovent. Tylenol for pain.  Discussed warning signs or symptoms. Please see discharge instructions. Patient expresses understanding.     Gregor Hams, MD 02/17/14 [phone]

## 2014-02-17 NOTE — ED Notes (Signed)
Reports waking this a.m with swollen/ sore throat.  States hand blood tinged sputum.  Denies fever, n/v/d

## 2014-02-17 NOTE — Discharge Instructions (Signed)
Thank you for coming in today. Use Atrovent nasal spray as needed. Take Orapred solution for 5 days. Use Tylenol for pain.  Take your blood pressure medication.  Go to the emergency room if you get worse or have trouble breathing Call or go to the emergency room if you get worse, have trouble breathing, have chest pains, or palpitations.   Pharyngitis Pharyngitis is redness, pain, and swelling (inflammation) of your pharynx.  CAUSES  Pharyngitis is usually caused by infection. Most of the time, these infections are from viruses (viral) and are part of a cold. However, sometimes pharyngitis is caused by bacteria (bacterial). Pharyngitis can also be caused by allergies. Viral pharyngitis may be spread from person to person by coughing, sneezing, and personal items or utensils (cups, forks, spoons, toothbrushes). Bacterial pharyngitis may be spread from person to person by more intimate contact, such as kissing.  SIGNS AND SYMPTOMS  Symptoms of pharyngitis include:   Sore throat.   Tiredness (fatigue).   Low-grade fever.   Headache.  Joint pain and muscle aches.  Skin rashes.  Swollen lymph nodes.  Plaque-like film on throat or tonsils (often seen with bacterial pharyngitis). DIAGNOSIS  Your health care provider will ask you questions about your illness and your symptoms. Your medical history, along with a physical exam, is often all that is needed to diagnose pharyngitis. Sometimes, a rapid strep test is done. Other lab tests may also be done, depending on the suspected cause.  TREATMENT  Viral pharyngitis will usually get better in 3-4 days without the use of medicine. Bacterial pharyngitis is treated with medicines that kill germs (antibiotics).  HOME CARE INSTRUCTIONS   Drink enough water and fluids to keep your urine clear or pale yellow.   Only take over-the-counter or prescription medicines as directed by your health care provider:   If you are prescribed  antibiotics, make sure you finish them even if you start to feel better.   Do not take aspirin.   Get lots of rest.   Gargle with 8 oz of salt water ( tsp of salt per 1 qt of water) as often as every 1-2 hours to soothe your throat.   Throat lozenges (if you are not at risk for choking) or sprays may be used to soothe your throat. SEEK MEDICAL CARE IF:   You have large, tender lumps in your neck.  You have a rash.  You cough up green, yellow-brown, or bloody spit. SEEK IMMEDIATE MEDICAL CARE IF:   Your neck becomes stiff.  You drool or are unable to swallow liquids.  You vomit or are unable to keep medicines or liquids down.  You have severe pain that does not go away with the use of recommended medicines.  You have trouble breathing (not caused by a stuffy nose). MAKE SURE YOU:   Understand these instructions.  Will watch your condition.  Will get help right away if you are not doing well or get worse. Document Released: 04/15/2005 Document Revised: 02/03/2013 Document Reviewed: 12/21/2012 [facility] Patient Information 2015 [place], [place]. This information is not intended to replace advice given to you by your health care provider. Make sure you discuss any questions you have with your health care provider.

## 2014-02-19 LAB — CULTURE, GROUP A STREP

## 2014-06-01 ENCOUNTER — Ambulatory Visit (INDEPENDENT_AMBULATORY_CARE_PROVIDER_SITE_OTHER): Payer: 59 | Admitting: Obstetrics and Gynecology

## 2014-06-01 ENCOUNTER — Encounter: Payer: Self-pay | Admitting: Obstetrics and Gynecology

## 2014-06-01 VITALS — BP 110/66 | HR 84 | Resp 16 | Ht 60.5 in | Wt 161.0 lb

## 2014-06-01 DIAGNOSIS — Z01419 Encounter for gynecological examination (general) (routine) without abnormal findings: Secondary | ICD-10-CM

## 2014-06-01 DIAGNOSIS — N8110 Cystocele, unspecified: Secondary | ICD-10-CM

## 2014-06-01 DIAGNOSIS — N63 Unspecified lump in breast: Secondary | ICD-10-CM

## 2014-06-01 DIAGNOSIS — R1900 Intra-abdominal and pelvic swelling, mass and lump, unspecified site: Secondary | ICD-10-CM

## 2014-06-01 DIAGNOSIS — IMO0002 Reserved for concepts with insufficient information to code with codable children: Secondary | ICD-10-CM

## 2014-06-01 DIAGNOSIS — N6320 Unspecified lump in the left breast, unspecified quadrant: Secondary | ICD-10-CM

## 2014-06-01 DIAGNOSIS — Z0000 Encounter for general adult medical examination without abnormal findings: Secondary | ICD-10-CM

## 2014-06-01 LAB — POCT URINALYSIS DIPSTICK
BILIRUBIN UA: NEGATIVE
Glucose, UA: NEGATIVE
KETONES UA: NEGATIVE
LEUKOCYTES UA: NEGATIVE
NITRITE UA: NEGATIVE
PROTEIN UA: NEGATIVE
RBC UA: NEGATIVE
Urobilinogen, UA: NEGATIVE
pH, UA: 7

## 2014-06-01 NOTE — Progress Notes (Signed)
59 y.o. G3P3000 MarriedCaucasianF here for annual exam.    Status post total vaginal hysterectomy in 2003 - abnormal bleeding. - Dr. Matthew Saras.  Ovaries remain.   Having some urinary incontinence for one year.  Some urgency to void.  Did not make it once and leaked a large volume.  No pad use.  No dysuria or hematuria.   Has an elevated ANA.  In process of a work up.  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  [place]   3 caffeine beverages per day.  Takes Rx for HTN.  No LMP recorded. Patient has had a hysterectomy.          Sexually active: Yes The current method of family planning is status post hysterectomy.    Exercising: Yes.    Walking Smoker:  no  Health Maintenance: Pap:  2012 - Normal  History of abnormal Pap:  Yes ~10 years ago. Repeat pap normal.  No colposcopy and biopsy done.  MMG:  02/2011 BIRADS1:Neg Colonoscopy:  2003 - Normal - Every 10 years.  Will call to establish care at [facility].  BMD:   2012 - Normal  TDaP:  2012 Screening Labs: PCP, Hb today: PCP, Urine today: negative   reports that she has never smoked. She has never used smokeless tobacco. She reports that she drinks about 0.6 - 1.2 oz of alcohol per week. She reports that she does not use illicit drugs.  Past Medical History  Diagnosis Date  . Hypertension   . Animal bite 03/18/2011    Dog Bite  . Asthma   . Arthritis   . Wears glasses   . Connective tissue disease, undifferentiated 2015    Dr. Amil Amen  . Abnormal Pap smear of cervix 2006    Repeat Pap normal    Past Surgical History  Procedure Laterality Date  . Back surgery  2007    lumb lam-fusion  . Abdominal hysterectomy  2003    hyst  . Colonoscopy    . Breast reduction surgery  05/26/2012    Procedure: MAMMARY REDUCTION  (BREAST);  Surgeon: Erline Hau, MD;  Location: [place];  Service: Plastics;  Laterality: Bilateral;     Current Outpatient Prescriptions  Medication Sig Dispense Refill  . hydroxychloroquine (PLAQUENIL) 200 MG tablet Take by mouth daily.    . ranitidine (ZANTAC) 150 MG tablet Take 150 mg by mouth daily as needed for heartburn.    . valsartan-hydrochlorothiazide (DIOVAN HCT) 160-12.5 MG per tablet Take 1 tablet by mouth daily. 30 tablet 11   No current facility-administered medications for this visit.    Family History  Problem Relation Age of Onset  . Heart disease Father   . Lung cancer Mother     never smoker    ROS:  Pertinent items are noted in HPI.  Otherwise, a comprehensive ROS was negative.  Exam:   BP 110/66 mmHg  Pulse 84  Resp 16  Ht 5' 0.5" (1.537 m)  Wt 161 lb (73.029 kg)  BMI 30.91 kg/m2     Height: 5' 0.5" (153.7 cm)  Ht Readings from Last 3 Encounters:  06/01/14 5' 0.5" (1.537 m)  05/05/13 5\' 2"  (1.575 m)  04/07/13 5\' 1"  (1.549 m)    General appearance: alert, cooperative and appears stated age Head: Normocephalic, without obvious abnormality, atraumatic Neck: no adenopathy, supple, symmetrical, trachea midline and thyroid normal to inspection and palpation Lungs: clear to  auscultation bilaterally Breasts: normal appearance, no masses or tenderness, No nipple retraction or dimpling, No nipple discharge or bleeding, No axillary or supraclavicular adenopathy, Bilateral breast reduction scars.  No mass of right breast. Left breast with 1.0 cm mass at 9:00 lateral to areola. Heart: regular rate and rhythm Abdomen: soft, non-tender; bowel sounds normal; no masses,  no organomegaly Extremities: extremities normal, atraumatic, no cyanosis or edema Skin: Skin color, texture, turgor normal. No rashes or lesions Lymph nodes: Cervical, supraclavicular, and axillary nodes normal. No abnormal inguinal nodes palpated Neurologic: Grossly normal   Pelvic: External genitalia:  no lesions              Urethra:  normal appearing urethra with no masses, tenderness or  lesions              Bartholins and Skenes: normal                 Vagina: normal appearing vagina with normal color and discharge, no lesions.  Almost third degree cystocele.              Cervix: absent              Pap taken: No. Bimanual Exam:  Uterus:  uterus absent              Adnexa: Right adnexa normal.  Left adnexal mass - 2.5 cm, nontender.               Rectovaginal: Confirms               Anus:  normal sphincter tone, no lesions  Chaperone was present for exam.  A:  Well Woman visit.  Status post TVH.  Left breast mass.  Left adnexal mass.  Left ovary at vaginal cuff? Almost third degree cystocele.  P:   Diagnostic bilateral mammogram and left breast ultrasound.  pap smear not indicated.  Return for pelvic ultrasound. Discussed pelvic organ prolapse.  return annually or prn  Additional 20 minutes regarding left breast mass, left adnexal mass, and cystocele.  Over 50% was spent in counseling.  After visit summary to patient.

## 2014-06-01 NOTE — Progress Notes (Signed)
Scheduled patient while in office for bilateral diagnotic mamm[facility]. Appointment scheduled for 2/11 at 1:40pm. Patient is agreeable to date and time. Placed in mammogram hold.

## 2014-06-01 NOTE — Patient Instructions (Signed)

## 2014-06-03 ENCOUNTER — Telehealth: Payer: Self-pay | Admitting: Obstetrics and Gynecology

## 2014-06-03 NOTE — Telephone Encounter (Signed)
Left message for patient to call back. Need to go over benefits and schedule PUS °

## 2014-06-09 ENCOUNTER — Ambulatory Visit
Admission: RE | Admit: 2014-06-09 | Discharge: 2014-06-09 | Disposition: A | Discharge status: Home / Self Care | Payer: 59 | Source: Ambulatory Visit | Attending: Obstetrics and Gynecology | Admitting: Obstetrics and Gynecology

## 2014-06-09 DIAGNOSIS — N6320 Unspecified lump in the left breast, unspecified quadrant: Secondary | ICD-10-CM

## 2014-06-30 ENCOUNTER — Ambulatory Visit (INDEPENDENT_AMBULATORY_CARE_PROVIDER_SITE_OTHER): Payer: 59 | Admitting: Obstetrics and Gynecology

## 2014-06-30 ENCOUNTER — Other Ambulatory Visit: Payer: 59 | Admitting: Obstetrics and Gynecology

## 2014-06-30 ENCOUNTER — Other Ambulatory Visit: Payer: 59

## 2014-06-30 ENCOUNTER — Ambulatory Visit (INDEPENDENT_AMBULATORY_CARE_PROVIDER_SITE_OTHER): Payer: 59

## 2014-06-30 ENCOUNTER — Encounter: Payer: Self-pay | Admitting: Obstetrics and Gynecology

## 2014-06-30 VITALS — BP 130/82 | HR 76 | Ht 60.5 in | Wt 160.0 lb

## 2014-06-30 DIAGNOSIS — R3915 Urgency of urination: Secondary | ICD-10-CM

## 2014-06-30 DIAGNOSIS — R1900 Intra-abdominal and pelvic swelling, mass and lump, unspecified site: Secondary | ICD-10-CM

## 2014-06-30 DIAGNOSIS — N8110 Cystocele, unspecified: Secondary | ICD-10-CM | POA: Diagnosis not present

## 2014-06-30 DIAGNOSIS — IMO0002 Reserved for concepts with insufficient information to code with codable children: Secondary | ICD-10-CM

## 2014-06-30 NOTE — Progress Notes (Signed)
  Subjective  59 y.o. G26P3000 Married Caucasian Female  Status post total vaginal hysterectomy in 2003 - abnormal bleeding. - Dr. Matthew Saras. Ovaries remain.   Patient is here today for pelvic ultrasound for potential left adnexal mass noted on routine pelvic exam on 06/01/14. Patient was also diagnosed with a cystocele, almost third degree. Has urinary urgency.  Would like discussion and a plan for this as well.   Had a diagnostic mammogram for a left breast mass noted at her annual exam on 06/01/14. Diagnosis was fatty necrosis with dystrophic calcification from prior breast reduction surgery.   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  Objective  Pelvic ultrasound images and report reviewed with patient.   Uterus absent.  Vaginal cuff with no masses.  Ovaries normal. No free fluid. Increased bowel pattern noted.    Assessment  Normal pelvic ultrasound. Cystocele. Urinary urgency.  Benign breast evaluation.   Plan  Reassurance given regarding pelvic anatomy.  Discussion of physical therapy, pessary care, and pelvic reconstructive surgery for cystocele. Patient would like to return for a pessary fitting. Discussed potential medication for urinary urgency.  Will fit pessary first and then determine if anticholinergic/antimuscarinic medication is appropriate.  Screening mammogram in one year.   25 minutes face to face time of which over 50% was spent in counseling.   After visit summary to patient.

## 2014-07-01 ENCOUNTER — Encounter: Payer: Self-pay | Admitting: Obstetrics and Gynecology

## 2015-05-18 MED FILL — HYDROXYCHLOROQUINE 200 MG T: 200 | 30 days supply | Qty: 60 | Fill #4

## 2015-05-19 MED FILL — VALSARTAN-HCTZ 160-12.5 MG: 160-12.5 | 90 days supply | Qty: 90 | Fill #0

## 2015-05-25 DIAGNOSIS — I10 Essential (primary) hypertension: Secondary | ICD-10-CM | POA: Diagnosis not present

## 2015-05-25 DIAGNOSIS — R768 Other specified abnormal immunological findings in serum: Secondary | ICD-10-CM | POA: Diagnosis not present

## 2015-05-25 DIAGNOSIS — M3501 Sicca syndrome with keratoconjunctivitis: Secondary | ICD-10-CM | POA: Diagnosis not present

## 2015-05-25 DIAGNOSIS — Z882 Allergy status to sulfonamides status: Secondary | ICD-10-CM | POA: Diagnosis not present

## 2015-06-07 ENCOUNTER — Ambulatory Visit: Payer: 59 | Admitting: Obstetrics and Gynecology

## 2015-06-22 ENCOUNTER — Encounter: Payer: Self-pay | Admitting: Obstetrics and Gynecology

## 2015-06-22 ENCOUNTER — Ambulatory Visit (INDEPENDENT_AMBULATORY_CARE_PROVIDER_SITE_OTHER): Payer: 59 | Admitting: Obstetrics and Gynecology

## 2015-06-22 VITALS — BP 110/70 | HR 78 | Resp 16 | Ht 60.5 in | Wt 160.0 lb

## 2015-06-22 DIAGNOSIS — N8110 Cystocele, unspecified: Secondary | ICD-10-CM

## 2015-06-22 DIAGNOSIS — IMO0002 Reserved for concepts with insufficient information to code with codable children: Secondary | ICD-10-CM

## 2015-06-22 DIAGNOSIS — Z01419 Encounter for gynecological examination (general) (routine) without abnormal findings: Secondary | ICD-10-CM | POA: Diagnosis not present

## 2015-06-22 DIAGNOSIS — N952 Postmenopausal atrophic vaginitis: Secondary | ICD-10-CM

## 2015-06-22 LAB — POCT URINALYSIS DIPSTICK
Bilirubin, UA: NEGATIVE
Blood, UA: NEGATIVE
Glucose, UA: NEGATIVE
Ketones, UA: NEGATIVE
Leukocytes, UA: NEGATIVE
Nitrite, UA: NEGATIVE
Protein, UA: NEGATIVE
UROBILINOGEN UA: NEGATIVE
pH, UA: 5

## 2015-06-22 NOTE — Patient Instructions (Signed)

## 2015-06-22 NOTE — Progress Notes (Signed)
60 y.o. G44P3000 Married caucasian female here for annual exam.    Has known cystocele.  Having some urgency of urination.  Sometimes has leakage, but this is not common.  Can leak if coughs hard.  Normal bowel movements.   Can have some burning with intercourse.  PCP:   Deland Pretty  Patient's last menstrual period was 04/29/2001 (lmp unknown).          Sexually active: Yes.    The current method of family planning is status post hysterectomy.    Exercising: Yes.    walking 2 miles 3 x a week.   Smoker:  no  Health Maintenance: Pap:  2012 Neg History of abnormal Pap:  yes~10 years ago.  MMG:   06/09/14 - diagnostic bilateral mammogram - left breast fat necrosis and calcification.   Colonoscopy:   2003 - Normal - Dr. Laural Golden Every 10 years. Will call to establish care at [facility]. Patient notes she hasn't scheduled anything yet. BMD:    2012 - Normal  TDaP: 2012 Screening Labs: PCP Hb today:PCP, Urine today: Neg   reports that she has never smoked. She has never used smokeless tobacco. She reports that she drinks about 0.6 - 1.2 oz of alcohol per week. She reports that she does not use illicit drugs.  Past Medical History  Diagnosis Date  . Hypertension   . Animal bite 03/18/2011    Dog Bite  . Asthma   . Arthritis   . Wears glasses   . Connective tissue disease, undifferentiated ([place]) 2015    Dr. Amil Amen  . Abnormal Pap smear of cervix 2006    Repeat Pap normal    Past Surgical History  Procedure Laterality Date  . Back surgery  2007    lumb lam-fusion  . Abdominal hysterectomy  2003    hyst  . Colonoscopy    . Breast reduction surgery  05/26/2012    Procedure: MAMMARY REDUCTION  (BREAST);  Surgeon: Erline Hau, MD;  Location: [place];  Service: Plastics;  Laterality: Bilateral;  . Basil cell      Current Outpatient Prescriptions  Medication Sig Dispense Refill  . cholecalciferol (VITAMIN D) 1000 units tablet Take 1,000 Units by mouth  daily.    . hydroxychloroquine (PLAQUENIL) 200 MG tablet Take by mouth daily.    . valsartan-hydrochlorothiazide (DIOVAN HCT) 160-12.5 MG per tablet Take 1 tablet by mouth daily. 30 tablet 11  . ranitidine (ZANTAC) 150 MG tablet Take 150 mg by mouth daily as needed for heartburn. Reported on 06/22/2015     No current facility-administered medications for this visit.    Family History  Problem Relation Age of Onset  . Heart disease Father   . Lung cancer Mother     never smoker    ROS:  Pertinent items are noted in HPI.  Otherwise, a comprehensive ROS was negative.  Exam:   Ht 5' 0.5" (1.537 m)  Wt 160 lb (72.576 kg)  BMI 30.72 kg/m2  LMP 04/29/2001 (LMP Unknown)    General appearance: alert, cooperative and appears stated age Head: Normocephalic, without obvious abnormality, atraumatic Neck: no adenopathy, supple, symmetrical, trachea midline and thyroid normal to inspection and palpation Lungs: clear to auscultation bilaterally Breasts: normal appearance, no masses or tenderness, Inspection negative, No nipple retraction or dimpling, No nipple discharge or bleeding, No axillary or supraclavicular adenopathy Heart: regular rate and rhythm Abdomen: soft, non-tender; bowel sounds normal; no masses,  no organomegaly Extremities: extremities normal, atraumatic, no cyanosis  or edema Skin: Skin color, texture, turgor normal. No rashes or lesions Lymph nodes: Cervical, supraclavicular, and axillary nodes normal. No abnormal inguinal nodes palpated Neurologic: Grossly normal  Pelvic: External genitalia:  no lesions              Urethra:  normal appearing urethra with no masses, tenderness or lesions              Bartholins and Skenes: normal                 Vagina: normal appearing vagina with normal color and discharge, no lesions.  Third degree cystocele.  Mild vault prolapse.  No rectocele.  Atrophy noted of cuff.              Cervix: absent              Pap taken: No. Bimanual  Exam:  Uterus:  uterus absent              Adnexa: no mass, fullness, tenderness              Rectovaginal: Yes.  .  Confirms.              Anus:  normal sphincter tone, no lesions  Chaperone was present for exam.  Assessment:   Well woman visit with normal exam. Vaginal atrophy.  Cystocele. Stable.   Plan: Yearly mammogram recommended after age 86.  Patient will schedule.  Recommended self breast exam.  Pap and HR HPV as above. Discussed Calcium, Vitamin D, regular exercise program including cardiovascular and weight bearing exercise. Labs performed.  No..    Labs with PCP.  Refills given on medications.  No..    Discussed options for atrophy tx - [place] jelly, Cooking oils, vit E suppositories, vaginal estrogens.  Discussed risks of estrogens, and patient declines.  Discussed observation of cystocele, pessary, and surgical care.  Patient wants observation. Follow up annually and prn.     After visit summary provided.

## 2015-06-26 DIAGNOSIS — M25569 Pain in unspecified knee: Secondary | ICD-10-CM | POA: Diagnosis not present

## 2015-06-26 DIAGNOSIS — B029 Zoster without complications: Secondary | ICD-10-CM | POA: Diagnosis not present

## 2015-06-26 MED FILL — ACYCLOVIR 800 MG TABLET: 800 | 7 days supply | Qty: 35 | Fill #0

## 2015-06-26 MED FILL — RIZATRIPTAN 10 MG TABLET: 10 | 30 days supply | Qty: 9 | Fill #0

## 2015-06-28 MED FILL — HYDROCODON-APAP 5-325: 5-325 | 7 days supply | Qty: 20 | Fill #0

## 2015-07-04 DIAGNOSIS — Z79899 Other long term (current) drug therapy: Secondary | ICD-10-CM | POA: Diagnosis not present

## 2015-07-04 DIAGNOSIS — H5310 Unspecified subjective visual disturbances: Secondary | ICD-10-CM | POA: Diagnosis not present

## 2015-07-04 DIAGNOSIS — H5202 Hypermetropia, left eye: Secondary | ICD-10-CM | POA: Diagnosis not present

## 2015-07-10 DIAGNOSIS — B029 Zoster without complications: Secondary | ICD-10-CM | POA: Diagnosis not present

## 2015-07-10 DIAGNOSIS — M25569 Pain in unspecified knee: Secondary | ICD-10-CM | POA: Diagnosis not present

## 2015-07-13 DIAGNOSIS — R5383 Other fatigue: Secondary | ICD-10-CM | POA: Diagnosis not present

## 2015-07-13 DIAGNOSIS — Z0389 Encounter for observation for other suspected diseases and conditions ruled out: Secondary | ICD-10-CM | POA: Diagnosis not present

## 2015-07-13 DIAGNOSIS — Z131 Encounter for screening for diabetes mellitus: Secondary | ICD-10-CM | POA: Diagnosis not present

## 2015-07-13 DIAGNOSIS — E2839 Other primary ovarian failure: Secondary | ICD-10-CM | POA: Diagnosis not present

## 2015-07-13 DIAGNOSIS — E785 Hyperlipidemia, unspecified: Secondary | ICD-10-CM | POA: Diagnosis not present

## 2015-07-13 DIAGNOSIS — E559 Vitamin D deficiency, unspecified: Secondary | ICD-10-CM | POA: Diagnosis not present

## 2015-07-18 MED FILL — ACYCLOVIR 400 MG TABLET: 400 | 7 days supply | Qty: 70 | Fill #0

## 2015-07-31 DIAGNOSIS — Z1283 Encounter for screening for malignant neoplasm of skin: Secondary | ICD-10-CM | POA: Diagnosis not present

## 2015-07-31 DIAGNOSIS — S30860A Insect bite (nonvenomous) of lower back and pelvis, initial encounter: Secondary | ICD-10-CM | POA: Diagnosis not present

## 2015-07-31 DIAGNOSIS — E559 Vitamin D deficiency, unspecified: Secondary | ICD-10-CM | POA: Diagnosis not present

## 2015-07-31 DIAGNOSIS — Z85828 Personal history of other malignant neoplasm of skin: Secondary | ICD-10-CM | POA: Diagnosis not present

## 2015-07-31 DIAGNOSIS — R739 Hyperglycemia, unspecified: Secondary | ICD-10-CM | POA: Diagnosis not present

## 2015-07-31 DIAGNOSIS — E785 Hyperlipidemia, unspecified: Secondary | ICD-10-CM | POA: Diagnosis not present

## 2015-07-31 DIAGNOSIS — E2839 Other primary ovarian failure: Secondary | ICD-10-CM | POA: Diagnosis not present

## 2015-07-31 DIAGNOSIS — Z08 Encounter for follow-up examination after completed treatment for malignant neoplasm: Secondary | ICD-10-CM | POA: Diagnosis not present

## 2015-07-31 MED FILL — ESTRADIOL TDS 0.025 MG/DAY: 0.025 | 28 days supply | Qty: 4 | Fill #0

## 2015-08-02 MED FILL — HYDROXYCHLOROQUINE 200 MG T: 200 | 30 days supply | Qty: 60 | Fill #0

## 2015-08-02 MED FILL — VALSARTAN-HCTZ 160-12.5 MG: 160-12.5 | 90 days supply | Qty: 90 | Fill #1

## 2015-08-03 DIAGNOSIS — M1711 Unilateral primary osteoarthritis, right knee: Secondary | ICD-10-CM | POA: Diagnosis not present

## 2015-08-03 DIAGNOSIS — M25561 Pain in right knee: Secondary | ICD-10-CM | POA: Diagnosis not present

## 2015-08-31 MED FILL — ESTRADIOL TDS 0.025 MG/DAY: 0.025 | 28 days supply | Qty: 4 | Fill #1

## 2015-09-19 DIAGNOSIS — Z79899 Other long term (current) drug therapy: Secondary | ICD-10-CM | POA: Diagnosis not present

## 2015-09-19 DIAGNOSIS — M359 Systemic involvement of connective tissue, unspecified: Secondary | ICD-10-CM | POA: Diagnosis not present

## 2015-09-19 DIAGNOSIS — M3501 Sicca syndrome with keratoconjunctivitis: Secondary | ICD-10-CM | POA: Diagnosis not present

## 2015-09-19 DIAGNOSIS — R768 Other specified abnormal immunological findings in serum: Secondary | ICD-10-CM | POA: Diagnosis not present

## 2015-10-02 MED FILL — HYDROXYCHLOROQUINE 200 MG T: 200 | 30 days supply | Qty: 60 | Fill #1

## 2015-10-05 MED FILL — ESTRADIOL TDS 0.025 MG/DAY: 0.025 | 28 days supply | Qty: 4 | Fill #2

## 2015-11-16 DIAGNOSIS — E559 Vitamin D deficiency, unspecified: Secondary | ICD-10-CM | POA: Diagnosis not present

## 2015-11-16 DIAGNOSIS — E785 Hyperlipidemia, unspecified: Secondary | ICD-10-CM | POA: Diagnosis not present

## 2015-11-16 DIAGNOSIS — E2839 Other primary ovarian failure: Secondary | ICD-10-CM | POA: Diagnosis not present

## 2015-11-16 DIAGNOSIS — R739 Hyperglycemia, unspecified: Secondary | ICD-10-CM | POA: Diagnosis not present

## 2015-11-17 MED FILL — VALSARTAN-HCTZ 160-12.5 MG: 160-12.5 | 90 days supply | Qty: 90 | Fill #2

## 2015-11-17 MED FILL — HYDROXYCHLOROQUINE 200 MG T: 200 | 30 days supply | Qty: 60 | Fill #2

## 2015-11-17 MED FILL — ESTRADIOL TDS 0.025 MG/DAY: 0.025 | 28 days supply | Qty: 4 | Fill #3

## 2015-11-20 DIAGNOSIS — R739 Hyperglycemia, unspecified: Secondary | ICD-10-CM | POA: Diagnosis not present

## 2015-11-20 DIAGNOSIS — R5383 Other fatigue: Secondary | ICD-10-CM | POA: Diagnosis not present

## 2015-11-20 DIAGNOSIS — E2839 Other primary ovarian failure: Secondary | ICD-10-CM | POA: Diagnosis not present

## 2015-11-20 DIAGNOSIS — E559 Vitamin D deficiency, unspecified: Secondary | ICD-10-CM | POA: Diagnosis not present

## 2015-11-20 DIAGNOSIS — E785 Hyperlipidemia, unspecified: Secondary | ICD-10-CM | POA: Diagnosis not present

## 2015-11-27 ENCOUNTER — Ambulatory Visit (HOSPITAL_COMMUNITY)
Admission: RE | Admit: 2015-11-27 | Discharge: 2015-11-27 | Disposition: A | Discharge status: Home / Self Care | Payer: 59 | Source: Ambulatory Visit | Attending: Internal Medicine | Admitting: Internal Medicine

## 2015-11-27 ENCOUNTER — Other Ambulatory Visit (HOSPITAL_COMMUNITY): Payer: Self-pay | Admitting: Internal Medicine

## 2015-11-27 DIAGNOSIS — G459 Transient cerebral ischemic attack, unspecified: Secondary | ICD-10-CM | POA: Insufficient documentation

## 2015-11-27 DIAGNOSIS — R200 Anesthesia of skin: Secondary | ICD-10-CM | POA: Diagnosis not present

## 2015-11-30 ENCOUNTER — Ambulatory Visit (INDEPENDENT_AMBULATORY_CARE_PROVIDER_SITE_OTHER): Payer: 59 | Admitting: Neurology

## 2015-11-30 ENCOUNTER — Encounter: Payer: Self-pay | Admitting: Neurology

## 2015-11-30 DIAGNOSIS — G43119 Migraine with aura, intractable, without status migrainosus: Secondary | ICD-10-CM

## 2015-11-30 HISTORY — DX: Migraine with aura, intractable, without status migrainosus: G43.119

## 2015-11-30 MED ORDER — TOPIRAMATE 25 MG PO TABS: ORAL_TABLET | ORAL | 3 refills | Status: DC

## 2015-11-30 MED FILL — TOPIRAMATE 25 MG TABLET: 25 | 30 days supply | Qty: 90 | Fill #0

## 2015-11-30 NOTE — Progress Notes (Signed)
Reason for visit: Transient left facial numbness  Referring physician: Dr. Sharman Cheek Victoria Cooper is a 60 y.o. female  History of present illness:  Victoria Cooper is a 60 year old right-handed white female with a history of migraine headache since her teenage years. The patient has classic migraine associated with visual aura with subsequent headache. The patient had a severe episode that occurred on 11/26/2015. The patient had onset of a sensation of imbalance as if the floor was moving from underneath her. The patient then had a fragmented patchy distortion of vision. She then noticed that the left face was numb, the tongue also felt numb. The patient was concerned that she was having an allergic reaction to some herbal tea and she went and took some Benadryl, but she had difficulty walking and she had to crawl to the bathroom to get the medication. The patient laid down and after about 30 minutes the symptoms seemed to improve, the patient then got a severe headache. The headaches generally are behind the eyes, and in the left occipital area. The patient has photophobia and phonophobia and some nausea with the headache. Her migraine headaches have become particularly frequent lately occurring 20 days out of a month. The patient has Maxalt to take for the headache but this is not very effective. The patient is on estradiol, she is concerned that the use of this medication may have increased the frequency of her headache. She denies any focal numbness or weakness of the arms or legs. She does get some neck stiffness with the headache. She is sent to this office for an evaluation. Weather changes may worsen the headache, sometimes seasonal changes will do the same.  Past Medical History:  Diagnosis Date  . Abnormal Pap smear of cervix 2006   Repeat Pap normal  . Animal bite 03/18/2011   Dog Bite  . Arthritis   . Asthma   . Connective tissue disease, undifferentiated ([place]) 2015   Dr. Amil Amen    . Hypertension   . Wears glasses     Past Surgical History:  Procedure Laterality Date  . ABDOMINAL HYSTERECTOMY  2003   hyst  . BACK SURGERY  2007   lumb lam-fusion  . Basil cell    . BREAST REDUCTION SURGERY  05/26/2012   Procedure: MAMMARY REDUCTION  (BREAST);  Surgeon: Erline Hau, MD;  Location: [place];  Service: Plastics;  Laterality: Bilateral;  . COLONOSCOPY      Family History  Problem Relation Age of Onset  . Heart disease Father   . Lung cancer Mother     never smoker    Social history:  reports that she has never smoked. She has never used smokeless tobacco. She reports that she drinks about 0.6 - 1.2 oz of alcohol per week . She reports that she does not use drugs.  Medications:  Prior to Admission medications   Medication Sig Start Date End Date Taking? Authorizing Provider  cholecalciferol (VITAMIN D) 1000 units tablet Take 1,000 Units by mouth daily.    Historical Provider, MD  hydroxychloroquine (PLAQUENIL) 200 MG tablet Take by mouth daily.    Historical Provider, MD  ranitidine (ZANTAC) 150 MG tablet Take 150 mg by mouth daily as needed for heartburn. Reported on 06/22/2015    Historical Provider, MD  valsartan-hydrochlorothiazide (DIOVAN HCT) 160-12.5 MG per tablet Take 1 tablet by mouth daily. 05/05/13   Tanda Rockers, MD      Allergies  Allergen Reactions  .  Morphine And Related Itching  . Sulfa Antibiotics Itching and Other (See Comments)    Itching in palms of hand and feet    ROS:  Out of a complete 14 system review of symptoms, the patient complains only of the following symptoms, and all other reviewed systems are negative.  Fatigue Shortness of breath Blurred vision Headache Insomnia  Last menstrual period 04/29/2001.  Physical Exam  General: The patient is alert and cooperative at the time of the examination. The patient is moderately obese.  Eyes: Pupils are equal, round, and reactive to light. Discs are  flat bilaterally.  Neck: The neck is supple, no carotid bruits are noted.  Respiratory: The respiratory examination is clear.  Cardiovascular: The cardiovascular examination reveals a regular rate and rhythm, no obvious murmurs or rubs are noted.  Skin: Extremities are without significant edema.  Neurologic Exam  Mental status: The patient is alert and oriented x 3 at the time of the examination. The patient has apparent normal recent and remote memory, with an apparently normal attention span and concentration ability.  Cranial nerves: Facial symmetry is present. There is good sensation of the face to pinprick and soft touch bilaterally. The strength of the facial muscles and the muscles to head turning and shoulder shrug are normal bilaterally. Speech is well enunciated, no aphasia or dysarthria is noted. Extraocular movements are full. Visual fields are full. The tongue is midline, and the patient has symmetric elevation of the soft palate. No obvious hearing deficits are noted.  Motor: The motor testing reveals 5 over 5 strength of all 4 extremities. Good symmetric motor tone is noted throughout.  Sensory: Sensory testing is intact to pinprick, soft touch, vibration sensation, and position sense on all 4 extremities. No evidence of extinction is noted.  Coordination: Cerebellar testing reveals good finger-nose-finger and heel-to-shin bilaterally.  Gait and station: Gait is normal. Tandem gait is normal. Romberg is negative. No drift is seen.  Reflexes: Deep tendon reflexes are symmetric and normal bilaterally. Toes are downgoing bilaterally.   MRI brain 11/27/15:  IMPRESSION: 1. No acute finding. 2. 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  * MRI scan images were reviewed online. I agree with the written report.    Assessment/Plan:  1. Classic migraine headache, intractable  The review of the MRI study shows that the study is essentially normal. The patient has  some very chronic cystic small changes in the cerebellum on the right. There is no evidence of any acute changes. The patient has intractable migraine however, and she will require prophylactic daily treatment. The patient will be placed on Topamax. It is possible that cessation of hormonal therapy may be of some benefit. The patient will follow-up in 3 months.  Jill Alexanders MD 11/30/2015 1:39 PM  [facility] [address] [place], [place] [address]  Phone [phone] Fax [phone]

## 2015-11-30 NOTE — Patient Instructions (Addendum)
Topamax (topiramate) is a seizure medication that has an FDA approval for seizures and for migraine headache. Potential side effects of this medication include weight loss, cognitive slowing, tingling in the fingers and toes, and carbonated drinks will taste bad. If any significant side effects are noted on this drug, please contact our office.  Migraine Headache A migraine headache is an intense, throbbing pain on one or both sides of your head. A migraine can last for 30 minutes to several hours. CAUSES  The exact cause of a migraine headache is not always known. However, a migraine may be caused when nerves in the brain become irritated and release chemicals that cause inflammation. This causes pain. Certain things may also trigger migraines, such as:  Alcohol.  Smoking.  Stress.  Menstruation.  Aged cheeses.  Foods or drinks that contain nitrates, glutamate, aspartame, or tyramine.  Lack of sleep.  Chocolate.  Caffeine.  Hunger.  Physical exertion.  Fatigue.  Medicines used to treat chest pain (nitroglycerine), birth control pills, estrogen, and some blood pressure medicines. SIGNS AND SYMPTOMS  Pain on one or both sides of your head.  Pulsating or throbbing pain.  Severe pain that prevents daily activities.  Pain that is aggravated by any physical activity.  Nausea, vomiting, or both.  Dizziness.  Pain with exposure to bright lights, loud noises, or activity.  General sensitivity to bright lights, loud noises, or smells. Before you get a migraine, you may get warning signs that a migraine is coming (aura). An aura may include:  Seeing flashing lights.  Seeing bright spots, halos, or zigzag lines.  Having tunnel vision or blurred vision.  Having feelings of numbness or tingling.  Having trouble talking.  Having muscle weakness. DIAGNOSIS  A migraine headache is often diagnosed based on:  Symptoms.  Physical exam.  A CT scan or MRI of your  head. These imaging tests cannot diagnose migraines, but they can help rule out other causes of headaches. TREATMENT Medicines may be given for pain and nausea. Medicines can also be given to help prevent recurrent migraines.  HOME CARE INSTRUCTIONS  Only take over-the-counter or prescription medicines for pain or discomfort as directed by your health care provider. The use of long-term narcotics is not recommended.  Lie down in a dark, quiet room when you have a migraine.  Keep a journal to find out what may trigger your migraine headaches. For example, write down:  What you eat and drink.  How much sleep you get.  Any change to your diet or medicines.  Limit alcohol consumption.  Quit smoking if you smoke.  Get 7-9 hours of sleep, or as recommended by your health care provider.  Limit stress.  Keep lights dim if bright lights bother you and make your migraines worse. SEEK IMMEDIATE MEDICAL CARE IF:   Your migraine becomes severe.  You have a fever.  You have a stiff neck.  You have vision loss.  You have muscular weakness or loss of muscle control.  You start losing your balance or have trouble walking.  You feel faint or pass out.  You have severe symptoms that are different from your first symptoms. MAKE SURE YOU:   Understand these instructions.  Will watch your condition.  Will get help right away if you are not doing well or get worse.   This information is not intended to replace advice given to you by your health care provider. Make sure you discuss any questions you have with your  health care provider.   Document Released: 04/15/2005 Document Revised: 05/06/2014 Document Reviewed: 12/21/2012 Elsevier Interactive Patient Education [employer].

## 2015-12-04 ENCOUNTER — Telehealth: Payer: Self-pay | Admitting: Neurology

## 2015-12-04 NOTE — Telephone Encounter (Signed)
I called Dr. Anastasio Champion, I left a message for him to contact me on my cell phone.

## 2015-12-04 NOTE — Telephone Encounter (Signed)
I talk with Dr. Anastasio Champion. The question was whether the addition of hormonal therapy may have worsen the headaches. If the patient does not respond to Topamax treatment with the migraine, alteration in the hormonal therapy may be important in controlling her headaches.

## 2015-12-04 NOTE — Telephone Encounter (Signed)
Dr Arvella Merles is calling and would like a call back about this mutual patient [phone]. Thanks!

## 2016-01-15 MED FILL — HYDROXYCHLOROQUINE 200 MG T: 200 | 30 days supply | Qty: 60 | Fill #3

## 2016-03-04 ENCOUNTER — Telehealth: Payer: Self-pay

## 2016-03-04 ENCOUNTER — Ambulatory Visit: Payer: 59 | Admitting: Adult Health

## 2016-03-04 NOTE — Telephone Encounter (Signed)
Patient did not show to appt today  

## 2016-03-04 NOTE — Progress Notes (Deleted)
PATIENT: Victoria Cooper DOB: 15-Jul-1955  REASON FOR VISIT: follow up HISTORY FROM: patient  HISTORY OF PRESENT ILLNESS: HISTORY 11/30/15:Victoria Cooper is a 60 year old right-handed white female with a history of migraine headache since her teenage years. The patient has classic migraine associated with visual aura with subsequent headache. The patient had a severe episode that occurred on 11/26/2015. The patient had onset of a sensation of imbalance as if the floor was moving from underneath her. The patient then had a fragmented patchy distortion of vision. She then noticed that the left face was numb, the tongue also felt numb. The patient was concerned that she was having an allergic reaction to some herbal tea and she went and took some Benadryl, but she had difficulty walking and she had to crawl to the bathroom to get the medication. The patient laid down and after about 30 minutes the symptoms seemed to improve, the patient then got a severe headache. The headaches generally are behind the eyes, and in the left occipital area. The patient has photophobia and phonophobia and some nausea with the headache. Her migraine headaches have become particularly frequent lately occurring 20 days out of a month. The patient has Maxalt to take for the headache but this is not very effective. The patient is on estradiol, she is concerned that the use of this medication may have increased the frequency of her headache. She denies any focal numbness or weakness of the arms or legs. She does get some neck stiffness with the headache. She is sent to this office for an evaluation. Weather changes may worsen the headache, sometimes seasonal changes will do the same.   REVIEW OF SYSTEMS: Out of a complete 14 system review of symptoms, the patient complains only of the following symptoms, and all other reviewed systems are negative.  ALLERGIES: Allergies  Allergen Reactions  . Morphine And Related Itching  .  Sulfa Antibiotics Itching and Other (See Comments)    Itching in palms of hand and feet    HOME MEDICATIONS: Outpatient Medications Prior to Visit  Medication Sig Dispense Refill  . cholecalciferol (VITAMIN D) 1000 units tablet Take 5,000 Units by mouth daily.     Marland Kitchen estradiol (CLIMARA - DOSED IN MG/24 HR) 0.025 mg/24hr patch   5  . hydroxychloroquine (PLAQUENIL) 200 MG tablet Take by mouth daily.    Marland Kitchen topiramate (TOPAMAX) 25 MG tablet Take one tablet at night for one week, then take 2 tablets at night for one week, then take 3 tablets at night. 90 tablet 3  . valsartan-hydrochlorothiazide (DIOVAN HCT) 160-12.5 MG per tablet Take 1 tablet by mouth daily. 30 tablet 11   No facility-administered medications prior to visit.     PAST MEDICAL HISTORY: Past Medical History:  Diagnosis Date  . Abnormal Pap smear of cervix 2006   Repeat Pap normal  . Animal bite 03/18/2011   Dog Bite  . Arthritis   . Asthma   . Classical migraine with intractable migraine 11/30/2015  . Connective tissue disease, undifferentiated ([place]) 2015   Dr. Amil Amen  . Hypertension   . Migraine   . Wears glasses     PAST SURGICAL HISTORY: Past Surgical History:  Procedure Laterality Date  . ABDOMINAL HYSTERECTOMY  2003   hyst  . BACK SURGERY  2009   lumb lam-fusion  . Basil cell    . BREAST REDUCTION SURGERY  05/26/2012   Procedure: MAMMARY REDUCTION  (BREAST);  Surgeon: Erline Hau, MD;  Location:  [facility];  Service: Plastics;  Laterality: Bilateral;  . COLONOSCOPY      FAMILY HISTORY: Family History  Problem Relation Age of Onset  . Heart disease Father   . Lung cancer Mother     never smoker  . ALS Maternal Grandfather   . Melanoma Paternal Grandmother   . Brain cancer Paternal Grandmother   . Stroke Paternal Grandfather   . Heart failure Maternal Grandmother   . Ulcerative colitis Brother   . Migraines Neg Hx     SOCIAL HISTORY: Social History   Social History  .  Marital status: Married    Spouse name: Richardson Landry  . Number of children: 3  . Years of education: Master's   Occupational History  . Chaplain     [employer]   Social History Main Topics  . Smoking status: Never Smoker  . Smokeless tobacco: Never Used  . Alcohol use 0.6 - 1.2 oz/week    1 - 2 Glasses of wine per week     Comment: occ  . Drug use: No  . Sexual activity: Yes    Birth control/ protection: Surgical   Other Topics Concern  . Not on file   Social History Narrative   Lives at home w/ her husband   Right-handed   Caffeine: 1-3 cups of coffee per day         PHYSICAL EXAM  There were no vitals filed for this visit. There is no height or weight on file to calculate BMI.  Generalized: Well developed, in no acute distress   Neurological examination  Mentation: Alert oriented to time, place, history taking. Follows all commands speech and language fluent Cranial nerve II-XII: Pupils were equal round reactive to light. Extraocular movements were full, visual field were full on confrontational test. Facial sensation and strength were normal. Uvula tongue midline. Head turning and shoulder shrug  were normal and symmetric. Motor: The motor testing reveals 5 over 5 strength of all 4 extremities. Good symmetric motor tone is noted throughout.  Sensory: Sensory testing is intact to soft touch on all 4 extremities. No evidence of extinction is noted.  Coordination: Cerebellar testing reveals good finger-nose-finger and heel-to-shin bilaterally.  Gait and station: Gait is normal. Tandem gait is normal. Romberg is negative. No drift is seen.  Reflexes: Deep tendon reflexes are symmetric and normal bilaterally.   DIAGNOSTIC DATA (LABS, IMAGING, TESTING) - I reviewed patient records, labs, notes, testing and imaging myself where available.  Lab Results  Component Value Date   HGB 15.1 (H) 05/26/2012      Component Value Date/Time   NA 138 05/22/2012 1330   K 3.8  05/22/2012 1330   CL 101 05/22/2012 1330   CO2 26 05/22/2012 1330   GLUCOSE 120 (H) 05/22/2012 1330   BUN 19 05/22/2012 1330   CREATININE 0.59 05/22/2012 1330   CALCIUM 9.3 05/22/2012 1330   GFRNONAA >90 05/22/2012 1330   GFRAA >90 05/22/2012 1330   No results found for: CHOL, HDL, LDLCALC, LDLDIRECT, TRIG, CHOLHDL No results found for: HGBA1C No results found for: VITAMINB12 No results found for: TSH    ASSESSMENT AND PLAN 60 y.o. year old female  has a past medical history of Abnormal Pap smear of cervix (2006); Animal bite (03/18/2011); Arthritis; Asthma; Classical migraine with intractable migraine (11/30/2015); Connective tissue disease, undifferentiated ([place]) (2015); Hypertension; Migraine; and Wears glasses. here with ***     Ward Givens, MSN, NP-C 03/04/2016, 3:09 PM [facility]  [address], [place], [place] [address] [phone]

## 2016-03-05 ENCOUNTER — Encounter: Payer: Self-pay | Admitting: Adult Health

## 2016-03-05 DIAGNOSIS — Z713 Dietary counseling and surveillance: Secondary | ICD-10-CM | POA: Diagnosis not present

## 2016-03-05 DIAGNOSIS — E559 Vitamin D deficiency, unspecified: Secondary | ICD-10-CM | POA: Diagnosis not present

## 2016-03-05 DIAGNOSIS — R739 Hyperglycemia, unspecified: Secondary | ICD-10-CM | POA: Diagnosis not present

## 2016-03-05 DIAGNOSIS — E785 Hyperlipidemia, unspecified: Secondary | ICD-10-CM | POA: Diagnosis not present

## 2016-03-05 DIAGNOSIS — E2839 Other primary ovarian failure: Secondary | ICD-10-CM | POA: Diagnosis not present

## 2016-03-05 DIAGNOSIS — R5383 Other fatigue: Secondary | ICD-10-CM | POA: Diagnosis not present

## 2016-03-11 MED FILL — HYDROXYCHLOROQUINE 200 MG T: 200 | 30 days supply | Qty: 60 | Fill #4

## 2016-03-11 MED FILL — VALSARTAN-HCTZ 160-12.5 MG: 160-12.5 | 90 days supply | Qty: 90 | Fill #3

## 2016-03-12 ENCOUNTER — Ambulatory Visit: Payer: 59 | Admitting: Adult Health

## 2016-03-25 DIAGNOSIS — M359 Systemic involvement of connective tissue, unspecified: Secondary | ICD-10-CM | POA: Diagnosis not present

## 2016-03-25 DIAGNOSIS — R768 Other specified abnormal immunological findings in serum: Secondary | ICD-10-CM | POA: Diagnosis not present

## 2016-03-25 DIAGNOSIS — Z79899 Other long term (current) drug therapy: Secondary | ICD-10-CM | POA: Diagnosis not present

## 2016-03-25 DIAGNOSIS — M3501 Sicca syndrome with keratoconjunctivitis: Secondary | ICD-10-CM | POA: Diagnosis not present

## 2016-05-21 MED FILL — HYDROXYCHLOROQUINE 200 MG T: 200 | 30 days supply | Qty: 60 | Fill #0

## 2016-05-23 DIAGNOSIS — Z0000 Encounter for general adult medical examination without abnormal findings: Secondary | ICD-10-CM | POA: Diagnosis not present

## 2016-05-23 DIAGNOSIS — Z13220 Encounter for screening for lipoid disorders: Secondary | ICD-10-CM | POA: Diagnosis not present

## 2016-05-23 DIAGNOSIS — E559 Vitamin D deficiency, unspecified: Secondary | ICD-10-CM | POA: Diagnosis not present

## 2016-05-27 DIAGNOSIS — J387 Other diseases of larynx: Secondary | ICD-10-CM | POA: Diagnosis not present

## 2016-05-27 DIAGNOSIS — G43909 Migraine, unspecified, not intractable, without status migrainosus: Secondary | ICD-10-CM | POA: Diagnosis not present

## 2016-05-27 DIAGNOSIS — Z0000 Encounter for general adult medical examination without abnormal findings: Secondary | ICD-10-CM | POA: Diagnosis not present

## 2016-05-27 DIAGNOSIS — I10 Essential (primary) hypertension: Secondary | ICD-10-CM | POA: Diagnosis not present

## 2016-05-28 MED FILL — AZITHROMYCIN 250 MG TABLET: 250 | 5 days supply | Qty: 6 | Fill #0

## 2016-06-12 MED FILL — VALSARTAN-HCTZ 160-12.5 MG: 160-12.5 | 90 days supply | Qty: 90 | Fill #0

## 2016-06-14 ENCOUNTER — Encounter: Payer: Self-pay | Admitting: Internal Medicine

## 2016-06-14 ENCOUNTER — Other Ambulatory Visit: Payer: Self-pay | Admitting: Obstetrics and Gynecology

## 2016-06-14 DIAGNOSIS — Z1231 Encounter for screening mammogram for malignant neoplasm of breast: Secondary | ICD-10-CM

## 2016-06-24 ENCOUNTER — Ambulatory Visit
Admission: RE | Admit: 2016-06-24 | Discharge: 2016-06-24 | Disposition: A | Discharge status: Home / Self Care | Payer: 59 | Source: Ambulatory Visit | Attending: Obstetrics and Gynecology | Admitting: Obstetrics and Gynecology

## 2016-06-24 DIAGNOSIS — Z1231 Encounter for screening mammogram for malignant neoplasm of breast: Secondary | ICD-10-CM | POA: Diagnosis not present

## 2016-07-02 DIAGNOSIS — E785 Hyperlipidemia, unspecified: Secondary | ICD-10-CM | POA: Diagnosis not present

## 2016-07-02 DIAGNOSIS — I10 Essential (primary) hypertension: Secondary | ICD-10-CM | POA: Diagnosis not present

## 2016-07-02 DIAGNOSIS — E2839 Other primary ovarian failure: Secondary | ICD-10-CM | POA: Diagnosis not present

## 2016-07-17 NOTE — Progress Notes (Signed)
61 y.o. G69P3003 Married Caucasian female here for annual exam.    Has third degree cystocele and mild vault prolapse.  Prolapse symptoms are the same. Urinary urgency and night urination.  Takes a diuretic. No difficulty voiding.  No real incontinence of urine other than once or twice when coming home. No difficulty with BM. Sexual functioning fine.   Tried estrogen this last year through Dr. Anastasio Champion and she had migraines. Has a Physiological scientist now.   Two new grandchildren in [place].   PCP:   Dr. Deland [facility] Dr. Anastasio Champion follows vit D.   Patient's last menstrual period was 04/29/2001 (lmp unknown).           Sexually active: Yes.    The current method of family planning is status post hysterectomy.    Exercising: Yes.    cardio, strength training, walking Smoker:  no  Health Maintenance: Pap: 2012 Neg History of abnormal Pap:  no MMG: 06-25-16 Density B/Neg/BiRads1:TBC   Colonoscopy: patient scheduled for 07/2016 -- 2003 - Normal - Dr. Laural Golden Every 10 years. BMD:   2012  Result  Normal TDaP:  2012 Gardasil:   N/A HIV:   Today. Hep C:  Today. Screening Labs:  Hb today: PCP takes care of labs   reports that she has never smoked. She has never used smokeless tobacco. She reports that she drinks about 0.6 - 1.2 oz of alcohol per week . She reports that she does not use drugs.  Past Medical History:  Diagnosis Date  . Abnormal Pap smear of cervix 2006   Repeat Pap normal  . Animal bite 03/18/2011   Dog Bite  . Arthritis   . Asthma   . Classical migraine with intractable migraine 11/30/2015  . Connective tissue disease, undifferentiated ([place]) 2015   Dr. Amil Amen  . Hypertension   . Migraine   . Wears glasses     Past Surgical History:  Procedure Laterality Date  . ABDOMINAL HYSTERECTOMY  2003   hyst  . BACK SURGERY  2009   lumb lam-fusion  . Basil cell    . BREAST REDUCTION SURGERY  05/26/2012   Procedure: MAMMARY REDUCTION  (BREAST);   Surgeon: Erline Hau, MD;  Location: [facility];  Service: Plastics;  Laterality: Bilateral;  . COLONOSCOPY      Current Outpatient Prescriptions  Medication Sig Dispense Refill  . cholecalciferol (VITAMIN D) 1000 units tablet Take 5,000 Units by mouth daily.     . hydroxychloroquine (PLAQUENIL) 200 MG tablet Take by mouth daily.    . valsartan-hydrochlorothiazide (DIOVAN HCT) 160-12.5 MG per tablet Take 1 tablet by mouth daily. 30 tablet 11   No current facility-administered medications for this visit.     Family History  Problem Relation Age of Onset  . Heart disease Father   . Lung cancer Mother     never smoker  . ALS Maternal Grandfather   . Melanoma Paternal Grandmother   . Brain cancer Paternal Grandmother   . Stroke Paternal Grandfather   . Heart failure Maternal Grandmother   . Ulcerative colitis Brother   . Migraines Neg Hx     ROS:  Pertinent items are noted in HPI.  Otherwise, a comprehensive ROS was negative.  Exam:   BP 116/72 (BP Location: Right Arm, Patient Position: Sitting, Cuff Size: Normal)   Pulse 68   Resp 16   Ht 5' 0.75" (1.543 m)   Wt 158 lb (71.7 kg)   LMP 04/29/2001 (LMP  Unknown)   BMI 30.10 kg/m     General appearance: alert, cooperative and appears stated age Head: Normocephalic, without obvious abnormality, atraumatic Neck: no adenopathy, supple, symmetrical, trachea midline and thyroid normal to inspection and palpation Lungs: clear to auscultation bilaterally Breasts: consistent with bilateral breast reduction, no masses or tenderness, No nipple retraction or dimpling, No nipple discharge or bleeding, No axillary or supraclavicular adenopathy Heart: regular rate and rhythm Abdomen: soft, non-tender; no masses, no organomegaly Extremities: extremities normal, atraumatic, no cyanosis or edema Skin: Skin color, texture, turgor normal. No rashes or lesions Lymph nodes: Cervical, supraclavicular, and axillary nodes  normal. No abnormal inguinal nodes palpated Neurologic: Grossly normal  Pelvic: External genitalia:  no lesions              Urethra:  normal appearing urethra with no masses, tenderness or lesions              Bartholins and Skenes: normal                 Vagina: normal appearing vagina with normal color and discharge, no lesions.  Third degree cystocele and mild vaginal vault prolapse.              Cervix: absent.                Pap taken: No. Bimanual Exam:  Uterus:   Absent.               Adnexa: no mass, fullness, tenderness              Rectal exam: Yes.  .  Confirms.              Anus:  normal sphincter tone, no lesions  Chaperone was present for exam.  Assessment:   Well woman visit with normal exam. Status post TAH.  Ovaries remain.  Cystocele and mild vaginal vault prolapse.  Stable and relatively asymptomatic. Vaginal atrophy.  Bilateral breast reduction.  On connective tissue disorder.  On Plaquinil   Plan: Mammogram screening discussed. Recommended self breast awareness. Pap and HR HPV as above. Guidelines for Calcium, Vitamin D, regular exercise program including cardiovascular and weight bearing exercise. Follow prolapse symptomatically.   Check HIV and hep C today.  We discussed WHI and risks of estrogen therapy.  Follow up annually and prn.       After visit summary provided.

## 2016-07-18 ENCOUNTER — Ambulatory Visit (INDEPENDENT_AMBULATORY_CARE_PROVIDER_SITE_OTHER): Payer: 59 | Admitting: Obstetrics and Gynecology

## 2016-07-18 ENCOUNTER — Encounter: Payer: Self-pay | Admitting: Obstetrics and Gynecology

## 2016-07-18 VITALS — BP 116/72 | HR 68 | Resp 16 | Ht 60.75 in | Wt 158.0 lb

## 2016-07-18 DIAGNOSIS — Z119 Encounter for screening for infectious and parasitic diseases, unspecified: Secondary | ICD-10-CM

## 2016-07-18 DIAGNOSIS — Z01419 Encounter for gynecological examination (general) (routine) without abnormal findings: Secondary | ICD-10-CM

## 2016-07-18 NOTE — Patient Instructions (Signed)

## 2016-07-19 LAB — HIV ANTIBODY (ROUTINE TESTING W REFLEX): HIV 1&2 Ab, 4th Generation: NONREACTIVE

## 2016-07-19 LAB — HEPATITIS C ANTIBODY: HCV AB: NEGATIVE

## 2016-07-29 MED FILL — HYDROXYCHLOROQUINE 200 MG T: 200 | 30 days supply | Qty: 60 | Fill #1

## 2016-08-02 ENCOUNTER — Encounter: Payer: Self-pay | Admitting: Internal Medicine

## 2016-08-02 ENCOUNTER — Ambulatory Visit (AMBULATORY_SURGERY_CENTER): Payer: Self-pay

## 2016-08-02 VITALS — Ht 61.0 in | Wt 159.8 lb

## 2016-08-02 DIAGNOSIS — Z1211 Encounter for screening for malignant neoplasm of colon: Secondary | ICD-10-CM

## 2016-08-02 NOTE — Progress Notes (Signed)
No allergies to eggs or soy No diet meds No home oxygen No past problems wih anesthesia  Registered emmi

## 2016-08-12 ENCOUNTER — Ambulatory Visit (AMBULATORY_SURGERY_CENTER): Payer: 59 | Admitting: Internal Medicine

## 2016-08-12 ENCOUNTER — Encounter: Payer: Self-pay | Admitting: Internal Medicine

## 2016-08-12 VITALS — BP 116/70 | HR 72 | Temp 97.1°F | Resp 10 | Ht 61.0 in | Wt 159.0 lb

## 2016-08-12 DIAGNOSIS — Z1211 Encounter for screening for malignant neoplasm of colon: Secondary | ICD-10-CM

## 2016-08-12 DIAGNOSIS — Z1212 Encounter for screening for malignant neoplasm of rectum: Secondary | ICD-10-CM | POA: Diagnosis not present

## 2016-08-12 DIAGNOSIS — I10 Essential (primary) hypertension: Secondary | ICD-10-CM | POA: Diagnosis not present

## 2016-08-12 MED ORDER — SODIUM CHLORIDE 0.9 % IV SOLN: 500.0000 mL | INTRAVENOUS | Status: DC

## 2016-08-12 NOTE — Patient Instructions (Addendum)
No polyps or cancer identified - good news!  You do have diverticulosis - thickened muscle rings and pouches in the colon wall. Please read the handout about this condition.  Next routine colonoscopy or other screening test in 10 years - 2028  I appreciate the opportunity to care for you. Gatha Mayer, MD, FACG     YOU HAD AN ENDOSCOPIC PROCEDURE TODAY AT [facility]:   Refer to the procedure report that was given to you for any specific questions about what was found during the examination.  If the procedure report does not answer your questions, please call your gastroenterologist to clarify.  If you requested that your care partner not be given the details of your procedure findings, then the procedure report has been included in a sealed envelope for you to review at your convenience later.  YOU SHOULD EXPECT: Some feelings of bloating in the abdomen. Passage of more gas than usual.  Walking can help get rid of the air that was put into your GI tract during the procedure and reduce the bloating. If you had a lower endoscopy (such as a colonoscopy or flexible sigmoidoscopy) you may notice spotting of blood in your stool or on the toilet paper. If you underwent a bowel prep for your procedure, you may not have a normal bowel movement for a few days.  Please Note:  You might notice some irritation and congestion in your nose or some drainage.  This is from the oxygen used during your procedure.  There is no need for concern and it should clear up in a day or so.  SYMPTOMS TO REPORT IMMEDIATELY:   Following lower endoscopy (colonoscopy or flexible sigmoidoscopy):  Excessive amounts of blood in the stool  Significant tenderness or worsening of abdominal pains  Swelling of the abdomen that is new, acute  Fever of 100F or higher   For urgent or emergent issues, a gastroenterologist can be reached at any hour by calling [phone].   DIET:  We do recommend  a small meal at first, but then you may proceed to your regular diet.  Drink plenty of fluids but you should avoid alcoholic beverages for 24 hours.  ACTIVITY:  You should plan to take it easy for the rest of today and you should NOT DRIVE or use heavy machinery until tomorrow (because of the sedation medicines used during the test).    FOLLOW UP: Our staff will call the number listed on your records the next business day following your procedure to check on you and address any questions or concerns that you may have regarding the information given to you following your procedure. If we do not reach you, we will leave a message.  However, if you are feeling well and you are not experiencing any problems, there is no need to return our call.  We will assume that you have returned to your regular daily activities without incident.  If any biopsies were taken you will be contacted by phone or by letter within the next 1-3 weeks.  Please call us at [phone] if you have not heard about the biopsies in 3 weeks.    SIGNATURES/CONFIDENTIALITY: You and/or your care partner have signed paperwork which will be entered into your electronic medical record.  These signatures attest to the fact that that the information above on your After Visit Summary has been reviewed and is understood.  Full responsibility of the confidentiality of this discharge information  lies with you and/or your care-partner.    Handout was given to your care partner on diverticulosis. You may resume your current medications today. Next routine screening colonoscopy in 10 years. Please call if any questions or concerns.

## 2016-08-12 NOTE — Progress Notes (Signed)
No problems noted in the recovery room. maw 

## 2016-08-12 NOTE — Progress Notes (Signed)
Report to PACU, RN, vss, BBS= Clear.  

## 2016-08-12 NOTE — Op Note (Addendum)
Ages Patient Name: Victoria Cooper Procedure Date: 08/12/2016 8:16 AM MRN: [id number] Endoscopist: Gatha Mayer , MD Age: 61 Referring MD:  Date of Birth: [birth year] Gender: Female Account #: [id number] Procedure:                Colonoscopy Indications:              Screening for colorectal malignant neoplasm, Last                            colonoscopy: 2003 Medicines:                Propofol per Anesthesia, Monitored Anesthesia Care Procedure:                Pre-Anesthesia Assessment:                           - Prior to the procedure, a History and Physical                            was performed, and patient medications and                            allergies were reviewed. The patient's tolerance of                            previous anesthesia was also reviewed. The risks                            and benefits of the procedure and the sedation                            options and risks were discussed with the patient.                            All questions were answered, and informed consent                            was obtained. Prior Anticoagulants: The patient has                            taken no previous anticoagulant or antiplatelet                            agents. ASA Grade Assessment: II - A patient with                            mild systemic disease. After reviewing the risks                            and benefits, the patient was deemed in                            satisfactory condition to undergo the procedure.  After obtaining informed consent, the colonoscope                            was passed under direct vision. Throughout the                            procedure, the patient's blood pressure, pulse, and                            oxygen saturations were monitored continuously. The                            Model CF-HQ190L [id number]) scope was introduced                            through the  anus and advanced to the the cecum,                            identified by appendiceal orifice and ileocecal                            valve. The colonoscopy was performed without                            difficulty. The patient tolerated the procedure                            well. The quality of the bowel preparation was                            good. The ileocecal valve, appendiceal orifice, and                            rectum were photographed. Scope In: 8:26:38 AM Scope Out: 8:43:39 AM Scope Withdrawal Time: 0 hours 11 minutes 26 seconds  Total Procedure Duration: 0 hours 17 minutes 1 second  Findings:                 The perianal and digital rectal examinations were                            normal. Pertinent negatives include normal prostate                            (size, shape, and consistency).                           Multiple small and large-mouthed diverticula were                            found in the sigmoid colon. There was no evidence                            of diverticular bleeding.  The exam was otherwise without abnormality on                            direct and retroflexion views. Complications:            No immediate complications. Estimated Blood Loss:     Estimated blood loss: none. Impression:               - Moderate diverticulosis in the sigmoid colon.                            There was no evidence of diverticular bleeding.                           - The examination was otherwise normal on direct                            and retroflexion views.                           - No specimens collected. Recommendation:           - Patient has a contact number available for                            emergencies. The signs and symptoms of potential                            delayed complications were discussed with the                            patient. Return to normal activities tomorrow.                             Written discharge instructions were provided to the                            patient.                           - Resume previous diet.                           - Continue present medications.                           - Repeat colonoscopy/other test in 10 years for                            screening purposes. Gatha Mayer, MD 08/12/2016 8:56:48 AM This report has been signed electronically. Addendum Number: 1   Addendum Date: 08/15/2016 1:17:08 PM      The original report notes that pertinent negatives include normal       prostate. That was an error in documentation and should be omitted from       this report. Gatha Mayer, MD 08/15/2016 1:18:07 PM This report has been signed electronically.

## 2016-08-13 ENCOUNTER — Telehealth: Payer: Self-pay | Admitting: *Deleted

## 2016-08-13 NOTE — Telephone Encounter (Signed)
  Follow up Call-  Call back number 08/12/2016  Post procedure Call Back phone  # [phone]  Permission to leave phone message Yes  Some recent data might be hidden     Patient questions:  Do you have a fever, pain , or abdominal swelling? No. Pain Score  0 *  Have you tolerated food without any problems? Yes.    Have you been able to return to your normal activities? Yes.    Do you have any questions about your discharge instructions: Diet   No. Medications  No. Follow up visit  No.  Do you have questions or concerns about your Care? No.  Actions: * If pain score is 4 or above: No action needed, pain <4.

## 2016-08-15 ENCOUNTER — Telehealth: Payer: Self-pay | Admitting: Internal Medicine

## 2016-08-15 NOTE — Telephone Encounter (Signed)
Patient notified that we will mail her the corrected copy.  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  She completely understands and thanks Dr. Carlean Purl.

## 2016-08-15 NOTE — Telephone Encounter (Signed)
Dr. Carlean Purl please see her procedure report about there prostate.  Can you remove this so we can send her a new copy?

## 2016-08-15 NOTE — Telephone Encounter (Signed)
Absolutely  Will fix that and get a new one out  I must have clicked on that by mistake - please explain that to her

## 2016-08-15 NOTE — Telephone Encounter (Signed)
patient states that there is an error in her colonoscopy report. she says that in findings section report says "Pertinent negatives include normal prostate (size, shape, and consistency)". Patient is requesting to have this removed from report.

## 2016-09-16 MED FILL — VALSARTAN-HCTZ 160-12.5 MG: 160-12.5 | 90 days supply | Qty: 90 | Fill #1

## 2016-09-16 MED FILL — HYDROXYCHLOROQUINE 200 MG T: 200 | 30 days supply | Qty: 60 | Fill #2

## 2016-09-17 DIAGNOSIS — H524 Presbyopia: Secondary | ICD-10-CM | POA: Diagnosis not present

## 2016-09-17 DIAGNOSIS — Z79899 Other long term (current) drug therapy: Secondary | ICD-10-CM | POA: Diagnosis not present

## 2016-09-26 DIAGNOSIS — R002 Palpitations: Secondary | ICD-10-CM | POA: Diagnosis not present

## 2016-09-26 DIAGNOSIS — R768 Other specified abnormal immunological findings in serum: Secondary | ICD-10-CM | POA: Diagnosis not present

## 2016-09-26 DIAGNOSIS — M3501 Sicca syndrome with keratoconjunctivitis: Secondary | ICD-10-CM | POA: Diagnosis not present

## 2016-09-26 DIAGNOSIS — Z79899 Other long term (current) drug therapy: Secondary | ICD-10-CM | POA: Diagnosis not present

## 2016-09-26 DIAGNOSIS — M359 Systemic involvement of connective tissue, unspecified: Secondary | ICD-10-CM | POA: Diagnosis not present

## 2016-11-27 DIAGNOSIS — R5383 Other fatigue: Secondary | ICD-10-CM | POA: Diagnosis not present

## 2016-11-27 DIAGNOSIS — E559 Vitamin D deficiency, unspecified: Secondary | ICD-10-CM | POA: Diagnosis not present

## 2016-11-27 DIAGNOSIS — E2839 Other primary ovarian failure: Secondary | ICD-10-CM | POA: Diagnosis not present

## 2016-11-27 DIAGNOSIS — Z780 Asymptomatic menopausal state: Secondary | ICD-10-CM | POA: Diagnosis not present

## 2016-11-27 DIAGNOSIS — I10 Essential (primary) hypertension: Secondary | ICD-10-CM | POA: Diagnosis not present

## 2016-11-27 MED FILL — HYDROXYCHLOROQUINE 200 MG T: 200 | 30 days supply | Qty: 60 | Fill #3

## 2016-12-05 MED FILL — PROGESTERONE 100 MG CAPSULE: 100 | 30 days supply | Qty: 30 | Fill #0

## 2016-12-05 MED FILL — ESTRADIOL 1 MG TABLET: 1 | 30 days supply | Qty: 30 | Fill #0

## 2017-01-01 DIAGNOSIS — Z780 Asymptomatic menopausal state: Secondary | ICD-10-CM | POA: Diagnosis not present

## 2017-01-01 DIAGNOSIS — E2839 Other primary ovarian failure: Secondary | ICD-10-CM | POA: Diagnosis not present

## 2017-01-02 MED FILL — VALSARTAN-HCTZ 160-12.5 MG: 160-12.5 | 90 days supply | Qty: 90 | Fill #2 | Status: TO

## 2017-01-23 MED FILL — PROGESTERONE 100 MG CAPSULE: 100 | 30 days supply | Qty: 30 | Fill #1 | Status: TO

## 2017-01-23 MED FILL — HYDROXYCHLOROQUINE 200 MG T: 200 | 30 days supply | Qty: 60 | Fill #0 | Status: TO

## 2017-03-28 MED FILL — HYDROXYCHLOROQUINE 200 MG T: 200 | 30 days supply | Qty: 60 | Fill #0

## 2017-03-28 MED FILL — VALSARTAN-HCTZ 160-12.5 MG: 160-12.5 | 90 days supply | Qty: 90 | Fill #0

## 2017-05-15 DIAGNOSIS — E559 Vitamin D deficiency, unspecified: Secondary | ICD-10-CM | POA: Diagnosis not present

## 2017-05-15 DIAGNOSIS — Z780 Asymptomatic menopausal state: Secondary | ICD-10-CM | POA: Diagnosis not present

## 2017-05-15 DIAGNOSIS — I10 Essential (primary) hypertension: Secondary | ICD-10-CM | POA: Diagnosis not present

## 2017-05-15 DIAGNOSIS — E2839 Other primary ovarian failure: Secondary | ICD-10-CM | POA: Diagnosis not present

## 2017-05-15 DIAGNOSIS — R5383 Other fatigue: Secondary | ICD-10-CM | POA: Diagnosis not present

## 2017-05-15 MED FILL — ARMOUR THYROID 30 MG TABLET: 30 | 30 days supply | Qty: 30 | Fill #0 | Status: TO

## 2017-05-22 MED FILL — PROGESTERONE 100 MG CAPSULE: 100 | 30 days supply | Qty: 30 | Fill #0 | Status: TO

## 2017-05-22 MED FILL — ESTRADIOL 0.5 MG TABLET: 0.5 | 30 days supply | Qty: 30 | Fill #0 | Status: TO

## 2017-05-26 DIAGNOSIS — M359 Systemic involvement of connective tissue, unspecified: Secondary | ICD-10-CM | POA: Diagnosis not present

## 2017-05-26 DIAGNOSIS — R002 Palpitations: Secondary | ICD-10-CM | POA: Diagnosis not present

## 2017-05-26 DIAGNOSIS — R768 Other specified abnormal immunological findings in serum: Secondary | ICD-10-CM | POA: Diagnosis not present

## 2017-05-26 DIAGNOSIS — M3501 Sicca syndrome with keratoconjunctivitis: Secondary | ICD-10-CM | POA: Diagnosis not present

## 2017-05-26 DIAGNOSIS — Z79899 Other long term (current) drug therapy: Secondary | ICD-10-CM | POA: Diagnosis not present

## 2017-05-28 MED FILL — HYDROXYCHLOROQUINE 200 MG T: 200 | 30 days supply | Qty: 60 | Fill #1

## 2017-05-28 MED FILL — SHIPPING COST: 1 days supply | Qty: 1 | Fill #0

## 2017-06-19 DIAGNOSIS — E2839 Other primary ovarian failure: Secondary | ICD-10-CM | POA: Diagnosis not present

## 2017-06-19 DIAGNOSIS — I10 Essential (primary) hypertension: Secondary | ICD-10-CM | POA: Diagnosis not present

## 2017-06-19 DIAGNOSIS — R5383 Other fatigue: Secondary | ICD-10-CM | POA: Diagnosis not present

## 2017-06-19 DIAGNOSIS — Z780 Asymptomatic menopausal state: Secondary | ICD-10-CM | POA: Diagnosis not present

## 2017-06-19 MED FILL — ARMOUR THYROID 30 MG TABLET: 30 | 90 days supply | Qty: 90 | Fill #0

## 2017-06-19 MED FILL — PROGESTERONE 200 MG CAPSULE: 200 | 90 days supply | Qty: 90 | Fill #0

## 2017-06-19 MED FILL — VALSARTAN-HCTZ 160-12.5 MG: 160-12.5 | 90 days supply | Qty: 90 | Fill #0

## 2017-06-19 MED FILL — SHIPPING COST: 1 days supply | Qty: 1 | Fill #1

## 2017-06-19 MED FILL — ESTRADIOL 0.5 MG TABS: 0.5 | 90 days supply | Qty: 90 | Fill #0

## 2017-06-23 DIAGNOSIS — Z0000 Encounter for general adult medical examination without abnormal findings: Secondary | ICD-10-CM | POA: Diagnosis not present

## 2017-06-23 DIAGNOSIS — E559 Vitamin D deficiency, unspecified: Secondary | ICD-10-CM | POA: Diagnosis not present

## 2017-06-23 DIAGNOSIS — E785 Hyperlipidemia, unspecified: Secondary | ICD-10-CM | POA: Diagnosis not present

## 2017-06-23 MED FILL — HYDROXYCHLOROQUINE 200 MG T: 200 | 30 days supply | Qty: 60 | Fill #0

## 2017-06-27 DIAGNOSIS — I10 Essential (primary) hypertension: Secondary | ICD-10-CM | POA: Diagnosis not present

## 2017-06-27 DIAGNOSIS — J387 Other diseases of larynx: Secondary | ICD-10-CM | POA: Diagnosis not present

## 2017-06-27 DIAGNOSIS — M359 Systemic involvement of connective tissue, unspecified: Secondary | ICD-10-CM | POA: Diagnosis not present

## 2017-06-27 DIAGNOSIS — Z0000 Encounter for general adult medical examination without abnormal findings: Secondary | ICD-10-CM | POA: Diagnosis not present

## 2017-06-27 DIAGNOSIS — R768 Other specified abnormal immunological findings in serum: Secondary | ICD-10-CM | POA: Diagnosis not present

## 2017-06-27 DIAGNOSIS — B029 Zoster without complications: Secondary | ICD-10-CM | POA: Diagnosis not present

## 2017-06-27 DIAGNOSIS — R292 Abnormal reflex: Secondary | ICD-10-CM | POA: Diagnosis not present

## 2017-06-27 DIAGNOSIS — G43909 Migraine, unspecified, not intractable, without status migrainosus: Secondary | ICD-10-CM | POA: Diagnosis not present

## 2017-07-31 ENCOUNTER — Ambulatory Visit: Payer: 59 | Admitting: Obstetrics and Gynecology

## 2017-08-07 DIAGNOSIS — E559 Vitamin D deficiency, unspecified: Secondary | ICD-10-CM | POA: Diagnosis not present

## 2017-08-07 DIAGNOSIS — E2839 Other primary ovarian failure: Secondary | ICD-10-CM | POA: Diagnosis not present

## 2017-08-07 DIAGNOSIS — R5383 Other fatigue: Secondary | ICD-10-CM | POA: Diagnosis not present

## 2017-08-13 ENCOUNTER — Ambulatory Visit: Payer: 59 | Admitting: Obstetrics and Gynecology

## 2017-08-19 DIAGNOSIS — Z85828 Personal history of other malignant neoplasm of skin: Secondary | ICD-10-CM | POA: Diagnosis not present

## 2017-08-19 DIAGNOSIS — L573 Poikiloderma of Civatte: Secondary | ICD-10-CM | POA: Diagnosis not present

## 2017-08-19 DIAGNOSIS — D225 Melanocytic nevi of trunk: Secondary | ICD-10-CM | POA: Diagnosis not present

## 2017-08-19 DIAGNOSIS — D1801 Hemangioma of skin and subcutaneous tissue: Secondary | ICD-10-CM | POA: Diagnosis not present

## 2017-08-19 DIAGNOSIS — L814 Other melanin hyperpigmentation: Secondary | ICD-10-CM | POA: Diagnosis not present

## 2017-08-19 DIAGNOSIS — L821 Other seborrheic keratosis: Secondary | ICD-10-CM | POA: Diagnosis not present

## 2017-08-20 ENCOUNTER — Telehealth: Payer: Self-pay | Admitting: Obstetrics and Gynecology

## 2017-08-20 ENCOUNTER — Encounter: Payer: Self-pay | Admitting: Obstetrics and Gynecology

## 2017-08-20 NOTE — Telephone Encounter (Signed)
Attempted to call patient regarding cancelled appointment for 10/03/17 with Dr Quincy Simmonds. Mailbox is full and could not leave message. Letter mailed.

## 2017-09-04 MED FILL — NP THYROID 90 MG TABLET: 90 | 30 days supply | Qty: 30 | Fill #0 | Status: TO

## 2017-09-29 MED FILL — ESTRADIOL 0.5 MG TABS: 0.5 | 30 days supply | Qty: 30 | Fill #0

## 2017-09-29 MED FILL — NP THYROID 90 MG TABLET: 90 | 30 days supply | Qty: 30 | Fill #0

## 2017-09-29 MED FILL — SHIPPING COST: 1 days supply | Qty: 1 | Fill #2

## 2017-09-29 MED FILL — PROGESTERONE MICRONIZED 200: 200 | 90 days supply | Qty: 90 | Fill #1

## 2017-09-29 MED FILL — VALSARTAN-HCTZ 160-12.5 MG: 160-12.5 | 90 days supply | Qty: 90 | Fill #1

## 2017-09-29 MED FILL — HYDROXYCHLOROQUINE 200 MG T: 200 | 30 days supply | Qty: 60 | Fill #1

## 2017-10-03 ENCOUNTER — Ambulatory Visit: Payer: 59 | Admitting: Obstetrics and Gynecology

## 2017-10-21 ENCOUNTER — Other Ambulatory Visit: Payer: Self-pay

## 2017-10-21 ENCOUNTER — Ambulatory Visit (INDEPENDENT_AMBULATORY_CARE_PROVIDER_SITE_OTHER): Payer: 59 | Admitting: Obstetrics and Gynecology

## 2017-10-21 ENCOUNTER — Encounter: Payer: Self-pay | Admitting: Obstetrics and Gynecology

## 2017-10-21 VITALS — BP 124/72 | HR 80 | Resp 16 | Ht 60.5 in | Wt 163.0 lb

## 2017-10-21 DIAGNOSIS — Z01419 Encounter for gynecological examination (general) (routine) without abnormal findings: Secondary | ICD-10-CM | POA: Diagnosis not present

## 2017-10-21 NOTE — Patient Instructions (Signed)

## 2017-10-21 NOTE — Progress Notes (Signed)
62 y.o. G1P3003 Married Caucasian female here for annual exam.    On HRT for 4 months.  Feeling more energy.  Sleeping better.  Not really having a lot of hot flashes prior to this but has generally been feeling warm.   Some urinary urgency.   Difficulty with weight gain.  States she does not do well with diets.  Not working out as much.   Now on thyroid replacement for borderline thyroid function.   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   PCP:  Dr. Kathee Delton, Dr. Lavonna Monarch.   Patient's last menstrual period was 04/29/2001 (lmp unknown).           Sexually active: Yes.    The current method of family planning is status post hysterectomy.    Exercising: Yes.    walking, resistance Smoker:  no  Health Maintenance: Pap:  2012 neg History of abnormal Pap:  Yes, but repeat was normal 2006 MMG:  06-24-16 category b density birads 1:neg.  Needs to schedule this.  Colonoscopy:  2018 normal BMD:   2012  Result  normal TDaP:  2012 Gardasil: no HIV: neg 2018 Hep C: neg 2018 Screening Labs: PCP.   reports that she has never smoked. She has never used smokeless tobacco. She reports that she drinks alcohol. She reports that she does not use drugs.  Past Medical History:  Diagnosis Date  . Abnormal Pap smear of cervix 2006   Repeat Pap normal  . Allergy   . Animal bite 03/18/2011   Dog Bite  . Arthritis   . Asthma   . Classical migraine with intractable migraine 11/30/2015  . Connective tissue disease, undifferentiated ([place]) 2015   Dr. Amil Amen  . GERD (gastroesophageal reflux disease)   . Hyperlipidemia   . Hypertension   . Migraine   . Wears glasses     Past Surgical History:  Procedure Laterality Date  . ABDOMINAL HYSTERECTOMY  2003   hyst  . BACK SURGERY  2009   lumb lam-fusion  . Basil cell    . BREAST REDUCTION SURGERY  05/26/2012   Procedure: MAMMARY REDUCTION  (BREAST);  Surgeon: Erline Hau, MD;  Location: [place];  Service: Plastics;  Laterality: Bilateral;  . COLONOSCOPY  2003, 2018   diverticulosis    Current Outpatient Medications  Medication Sig Dispense Refill  . Cholecalciferol (VITAMIN D PO) Take by mouth.    . estradiol (ESTRACE) 0.5 MG tablet   2  . hydroxychloroquine (PLAQUENIL) 200 MG tablet Take by mouth daily.    Marland Kitchen MELATONIN PO Take by mouth at bedtime.    . NP THYROID 90 MG tablet   2  . progesterone (PROMETRIUM) 200 MG capsule   1  . valsartan-hydrochlorothiazide (DIOVAN HCT) 160-12.5 MG per tablet Take 1 tablet by mouth daily. 30 tablet 11   No current facility-administered medications for this visit.     Family History  Problem Relation Age of Onset  . Heart disease Father   . Lung cancer Mother        never smoker  . ALS Maternal Grandfather   . Melanoma Paternal Grandmother   . Brain cancer Paternal Grandmother   . Stroke Paternal Grandfather   . Heart failure Maternal Grandmother   . Ulcerative colitis Brother   . Migraines Neg Hx   . Colon cancer Neg Hx     Review of  Systems  Constitutional: Negative.   HENT: Negative.   Eyes: Negative.   Respiratory: Negative.   Cardiovascular: Negative.   Gastrointestinal: Negative.   Endocrine: Negative.   Genitourinary: Negative.   Musculoskeletal: Negative.   Skin: Negative.   Allergic/Immunologic: Negative.   Neurological: Negative.   Psychiatric/Behavioral: Negative.     Exam:   BP 124/72   Pulse 80   Resp 16   Ht 5' 0.5" (1.537 m)   Wt 163 lb (73.9 kg)   LMP 04/29/2001 (LMP Unknown)   BMI 31.31 kg/m     General appearance: alert, cooperative and appears stated age Head: Normocephalic, without obvious abnormality, atraumatic Neck: no adenopathy, supple, symmetrical, trachea midline and thyroid normal to inspection and palpation Lungs: clear to auscultation bilaterally Breasts: consistent with reduction, no masses or tenderness, No nipple retraction or  dimpling, No nipple discharge or bleeding, No axillary or supraclavicular adenopathy Heart: regular rate and rhythm Abdomen: soft, non-tender; no masses, no organomegaly Extremities: extremities normal, atraumatic, no cyanosis or edema Skin: Skin color, texture, turgor normal. No rashes or lesions Lymph nodes: Cervical, supraclavicular, and axillary nodes normal. No abnormal inguinal nodes palpated Neurologic: Grossly normal  Pelvic: External genitalia:  no lesions              Urethra:  normal appearing urethra with no masses, tenderness or lesions              Bartholins and Skenes: normal                 Vagina: normal appearing vagina with normal color and discharge, no lesions.. Second degree cystocele, first degree vault prolapse, first degree rectocele.              Cervix: absent.              Pap taken: No. Bimanual Exam:  Uterus:   Absent.              Adnexa: no mass, fullness, tenderness              Rectal exam: Yes.  .  Confirms.              Anus:  normal sphincter tone, no lesions  Chaperone was present for exam.  Assessment:   Well woman visit with normal exam. Status post TAH.  Ovaries remain.  HRT, treatment initiated years after menopause.  Cystocele and mild vaginal vault prolapse.  Stable and relatively asymptomatic. Vaginal atrophy.  Bilateral breast reduction.  Connective tissue disorder.  On Plaquenil   Plan: Mammogram screening. Recommended self breast awareness. Pap and HR HPV as above. Guidelines for Calcium, Vitamin D, regular exercise program including cardiovascular and weight bearing exercise. We discussed HRT and WHI study.  We reviewed benefits and risks which include stroke, DVT, PE.   She will review her HRT with her prescriber and determine if she will continue.  Written information also given.  Follow up annually and prn.   After visit summary provided.

## 2017-10-22 DIAGNOSIS — E669 Obesity, unspecified: Secondary | ICD-10-CM | POA: Diagnosis not present

## 2017-10-22 DIAGNOSIS — R5383 Other fatigue: Secondary | ICD-10-CM | POA: Diagnosis not present

## 2017-10-22 DIAGNOSIS — I10 Essential (primary) hypertension: Secondary | ICD-10-CM | POA: Diagnosis not present

## 2017-10-22 DIAGNOSIS — E2839 Other primary ovarian failure: Secondary | ICD-10-CM | POA: Diagnosis not present

## 2017-10-29 ENCOUNTER — Ambulatory Visit: Payer: 59 | Admitting: Obstetrics and Gynecology

## 2017-11-04 MED FILL — SHIPPING COST: 1 days supply | Qty: 1 | Fill #3

## 2017-11-04 MED FILL — ESTRADIOL 0.5 MG TABLET: 0.5 | 60 days supply | Qty: 60 | Fill #1

## 2017-11-06 ENCOUNTER — Ambulatory Visit: Payer: 59 | Admitting: Obstetrics and Gynecology

## 2017-11-06 ENCOUNTER — Encounter: Payer: Self-pay | Admitting: Obstetrics and Gynecology

## 2017-11-06 ENCOUNTER — Other Ambulatory Visit: Payer: Self-pay

## 2017-11-06 VITALS — BP 120/70 | HR 92 | Resp 16 | Ht 60.5 in | Wt 160.0 lb

## 2017-11-06 DIAGNOSIS — N816 Rectocele: Secondary | ICD-10-CM

## 2017-11-06 DIAGNOSIS — N8110 Cystocele, unspecified: Secondary | ICD-10-CM | POA: Diagnosis not present

## 2017-11-06 NOTE — Patient Instructions (Signed)
You may take the pessary out once or twice a week at bedtime and leave it out overnight.   Clean the pessary with soap and water only.   If you develop green foul smelling discharge, leave the pessary out for 2 - 3 days.   Use KY jelly or any other personal lubricant that is water based to help with placement and removal.   Wear a sanitary pad until you are sure you understand if you have urinary incontinence with the pessary in place.   Wear biking type shorts with crotch support for exercise until you know that the pessary will not come out easily with maneuvers causing increases in abdominal pressure.  The pessary needs to be removed for sexual activity.   Look before you flush the toilet to be sure the pessary has not come out with straining on the toilet!  Please call for any pain or vaginal bleeding.

## 2017-11-06 NOTE — Progress Notes (Signed)
GYNECOLOGY  VISIT   HPI: 62 y.o.   Married  Caucasian  female   [phone] with Patient's last menstrual period was 04/29/2001 (lmp unknown).   here for pessary fitting.  Has known second degree cystocele, first degree vault prolapse, first degree rectocele.  Mostly has pelvic pressure symptoms.    No regular incontinence with cough, laugh, or sneeze.     GYNECOLOGIC HISTORY: Patient's last menstrual period was 04/29/2001 (lmp unknown). Contraception:  Hysterectomy Menopausal hormone therapy:  Estrace Last mammogram:  06/24/16 BIRADS 1 negative/density c Last pap smear:   2012 Negative        OB History    Gravida  3   Para  3   Term  3   Preterm      AB      Living  3     SAB      TAB      Ectopic      Multiple      Live Births                 Patient Active Problem List   Diagnosis Date Noted  . Classical migraine with intractable migraine 11/30/2015  . Upper airway cough syndrome 04/07/2013  . Essential hypertension, benign 04/07/2013  . Extrinsic asthma, unspecified 03/05/2013  . Animal bite 03/18/2011    Past Medical History:  Diagnosis Date  . Abnormal Pap smear of cervix 2006   Repeat Pap normal  . Allergy   . Animal bite 03/18/2011   Dog Bite  . Arthritis   . Asthma   . Classical migraine with intractable migraine 11/30/2015  . Connective tissue disease, undifferentiated ([place]) 2015   Dr. Amil Amen  . GERD (gastroesophageal reflux disease)   . Hyperlipidemia   . Hypertension   . Migraine   . Wears glasses     Past Surgical History:  Procedure Laterality Date  . ABDOMINAL HYSTERECTOMY  2003   hyst  . BACK SURGERY  2009   lumb lam-fusion  . Basil cell    . BREAST REDUCTION SURGERY  05/26/2012   Procedure: MAMMARY REDUCTION  (BREAST);  Surgeon: Erline Hau, MD;  Location: [place];  Service: Plastics;  Laterality: Bilateral;  . COLONOSCOPY  2003, 2018   diverticulosis    Current Outpatient Medications   Medication Sig Dispense Refill  . Cholecalciferol (VITAMIN D PO) Take by mouth.    . estradiol (ESTRACE) 0.5 MG tablet   2  . hydroxychloroquine (PLAQUENIL) 200 MG tablet Take by mouth daily.    Marland Kitchen MELATONIN PO Take by mouth at bedtime.    . NP THYROID 90 MG tablet   2  . progesterone (PROMETRIUM) 200 MG capsule   1  . valsartan-hydrochlorothiazide (DIOVAN HCT) 160-12.5 MG per tablet Take 1 tablet by mouth daily. 30 tablet 11   No current facility-administered medications for this visit.      ALLERGIES: Morphine and related and Sulfa antibiotics  Family History  Problem Relation Age of Onset  . Heart disease Father   . Lung cancer Mother        never smoker  . ALS Maternal Grandfather   . Melanoma Paternal Grandmother   . Brain cancer Paternal Grandmother   . Stroke Paternal Grandfather   . Heart failure Maternal Grandmother   . Ulcerative colitis Brother   . Migraines Neg Hx   . Colon cancer Neg Hx     Social History   Socioeconomic History  . Marital status:  Married    Spouse name: Richardson Landry  . Number of children: 3  . Years of education: Master's  . Highest education level: Not on file  Occupational History  . Occupation: Chaplain    Comment: Engineer, technical sales  Social Needs  . Financial resource strain: Not on file  . Food insecurity:    Worry: Not on file    Inability: Not on file  . Transportation needs:    Medical: Not on file    Non-medical: Not on file  Tobacco Use  . Smoking status: Never Smoker  . Smokeless tobacco: Never Used  Substance and Sexual Activity  . Alcohol use: Yes    Alcohol/week: 0.0 - 1.2 oz  . Drug use: No  . Sexual activity: Yes    Partners: Male    Birth control/protection: Surgical    Comment: hysterectomy  Lifestyle  . Physical activity:    Days per week: Not on file    Minutes per session: Not on file  . Stress: Not on file  Relationships  . Social connections:    Talks on phone: Not on file    Gets together: Not on file     Attends religious service: Not on file    Active member of club or organization: Not on file    Attends meetings of clubs or organizations: Not on file    Relationship status: Not on file  . Intimate partner violence:    Fear of current or ex partner: Not on file    Emotionally abused: Not on file    Physically abused: Not on file    Forced sexual activity: Not on file  Other Topics Concern  . Not on file  Social History Narrative   Lives at home w/ her husband   Chaplain at [employer]   Right-handed   Caffeine: 1-3 cups of coffee per day    Review of Systems  Constitutional: Negative.   HENT: Negative.   Eyes: Negative.   Respiratory: Negative.   Cardiovascular: Negative.   Gastrointestinal: Negative.   Endocrine: Negative.   Genitourinary: Negative.   Musculoskeletal: Negative.   Skin: Negative.   Allergic/Immunologic: Negative.   Neurological: Negative.   Hematological: Negative.   Psychiatric/Behavioral: Negative.     PHYSICAL EXAMINATION:    BP 120/70 (BP Location: Right Arm, Patient Position: Sitting, Cuff Size: Normal)   Pulse 92   Resp 16   Ht 5' 0.5" (1.537 m)   Wt 160 lb (72.6 kg)   LMP 04/29/2001 (LMP Unknown)   BMI 30.73 kg/m     General appearance: alert, cooperative and appears stated age    Pelvic: External genitalia:  no lesions              Urethra:  normal appearing urethra with no masses, tenderness or lesions                      Bimanual Exam:  Uterus: absent.               Adnexa: no mass, fullness, tenderness       Pessary fitting.  Ring with support pessaries tried.  #4 comfortable and able to maintain with maneuvers.  #5 ring with support too big and #3 ring with support too small.   Chaperone was present for exam.  ASSESSMENT  Incomplete vaginal prolapse.  Status post hysterectomy.   PLAN  Milex #4 ring with support to patient -  lot number   [id number],expiration -  02/27/2027. Patient will wear the pessary until she  returns for her recheck in about 7 - 10 days.  She declines to learn how to place and remove today.  Pessary instructions and guidelines given.    An After Visit Summary was printed and given to the patient.  __15____ minutes face to face time of which over 50% was spent in counseling.

## 2017-11-12 NOTE — Progress Notes (Signed)
GYNECOLOGY  VISIT   HPI: 62 y.o.   Married  Caucasian  female   [phone] with Patient's last menstrual period was 04/29/2001 (lmp unknown).   here for   Pessary check  She states that by the end of her day the pessary uncomfortable.  Feels a big relief when she removes it at the end of the day.   No vaginal itching but it is bothering her.  No vaginal bleeding.   Feels her urgency is improved overall.   Has known second degree cystocele, first degree vault prolapse, first degree rectocele.  Has #4 ring with support pessary.    1- 2 coffees in the am.  Rare tea.  No sodas.   No glaucoma or cardiac arrhythmia.   GYNECOLOGIC HISTORY: Patient's last menstrual period was 04/29/2001 (lmp unknown). Contraception:  Hysterectomy  Menopausal hormone therapy:  Estradiol/ progesterone  Last mammogram:  06-25-16 density b/BIRADS 1 negative  Last pap smear:   2012 negative         OB History    Gravida  3   Para  3   Term  3   Preterm      AB      Living  3     SAB      TAB      Ectopic      Multiple      Live Births                 Patient Active Problem List   Diagnosis Date Noted  . Classical migraine with intractable migraine 11/30/2015  . Upper airway cough syndrome 04/07/2013  . Essential hypertension, benign 04/07/2013  . Extrinsic asthma, unspecified 03/05/2013  . Animal bite 03/18/2011    Past Medical History:  Diagnosis Date  . Abnormal Pap smear of cervix 2006   Repeat Pap normal  . Allergy   . Animal bite 03/18/2011   Dog Bite  . Arthritis   . Asthma   . Classical migraine with intractable migraine 11/30/2015  . Connective tissue disease, undifferentiated ([place]) 2015   Dr. Amil Amen  . GERD (gastroesophageal reflux disease)   . Hyperlipidemia   . Hypertension   . Migraine   . Wears glasses     Past Surgical History:  Procedure Laterality Date  . ABDOMINAL HYSTERECTOMY  2003   hyst  . BACK SURGERY  2009   lumb lam-fusion  . Basil cell     . BREAST REDUCTION SURGERY  05/26/2012   Procedure: MAMMARY REDUCTION  (BREAST);  Surgeon: Erline Hau, MD;  Location: [facility];  Service: Plastics;  Laterality: Bilateral;  . COLONOSCOPY  2003, 2018   diverticulosis    Current Outpatient Medications  Medication Sig Dispense Refill  . Cholecalciferol (VITAMIN D PO) Take by mouth.    . estradiol (ESTRACE) 0.5 MG tablet   2  . hydroxychloroquine (PLAQUENIL) 200 MG tablet Take by mouth daily.    Marland Kitchen MELATONIN PO Take by mouth at bedtime.    . NP THYROID 90 MG tablet   2  . progesterone (PROMETRIUM) 200 MG capsule   1  . valsartan-hydrochlorothiazide (DIOVAN HCT) 160-12.5 MG per tablet Take 1 tablet by mouth daily. 30 tablet 11   No current facility-administered medications for this visit.      ALLERGIES: Morphine and related and Sulfa antibiotics  Family History  Problem Relation Age of Onset  . Heart disease Father   . Lung cancer Mother  never smoker  . ALS Maternal Grandfather   . Melanoma Paternal Grandmother   . Brain cancer Paternal Grandmother   . Stroke Paternal Grandfather   . Heart failure Maternal Grandmother   . Ulcerative colitis Brother   . Migraines Neg Hx   . Colon cancer Neg Hx     Social History   Socioeconomic History  . Marital status: Married    Spouse name: Richardson Landry  . Number of children: 3  . Years of education: Master's  . Highest education level: Not on file  Occupational History  . Occupation: Chaplain    Comment: Engineer, technical sales  Social Needs  . Financial resource strain: Not on file  . Food insecurity:    Worry: Not on file    Inability: Not on file  . Transportation needs:    Medical: Not on file    Non-medical: Not on file  Tobacco Use  . Smoking status: Never Smoker  . Smokeless tobacco: Never Used  Substance and Sexual Activity  . Alcohol use: Yes    Alcohol/week: 0.0 - 1.2 oz  . Drug use: No  . Sexual activity: Yes    Partners: Male    Birth  control/protection: Surgical    Comment: hysterectomy  Lifestyle  . Physical activity:    Days per week: Not on file    Minutes per session: Not on file  . Stress: Not on file  Relationships  . Social connections:    Talks on phone: Not on file    Gets together: Not on file    Attends religious service: Not on file    Active member of club or organization: Not on file    Attends meetings of clubs or organizations: Not on file    Relationship status: Not on file  . Intimate partner violence:    Fear of current or ex partner: Not on file    Emotionally abused: Not on file    Physically abused: Not on file    Forced sexual activity: Not on file  Other Topics Concern  . Not on file  Social History Narrative   Lives at home w/ her husband   Chaplain at [employer]   Right-handed   Caffeine: 1-3 cups of coffee per day    Review of Systems  Constitutional: Negative.   HENT: Negative.   Eyes: Negative.   Respiratory: Negative.   Cardiovascular: Negative.   Gastrointestinal: Negative.   Endocrine: Negative.   Genitourinary: Positive for urgency.  Musculoskeletal: Negative.   Skin: Negative.   Allergic/Immunologic: Negative.   Neurological: Negative.   Hematological: Negative.   Psychiatric/Behavioral: Negative.     PHYSICAL EXAMINATION:    BP 130/68   Pulse 78   Resp 16   Ht 5' (1.524 m)   Wt 158 lb (71.7 kg)   LMP 04/29/2001 (LMP Unknown)   BMI 30.86 kg/m     General appearance: alert, cooperative and appears stated age Head: Normocephalic, without obvious abnormality, atraumatic Neck: no adenopathy, supple, symmetrical, trachea midline and thyroid normal to inspection and palpation Lungs: clear to auscultation bilaterally Breasts: normal appearance, no masses or tenderness, No nipple retraction or dimpling, No nipple discharge or bleeding, No axillary or supraclavicular adenopathy Heart: regular rate and rhythm Abdomen: soft, non-tender, no masses,  no  organomegaly Extremities: extremities normal, atraumatic, no cyanosis or edema Skin: Skin color, texture, turgor normal. No rashes or lesions Lymph nodes: Cervical, supraclavicular, and axillary nodes normal. No abnormal inguinal nodes palpated Neurologic: Grossly  normal  Pelvic: External genitalia:  no lesions              Urethra:  normal appearing urethra with no masses, tenderness or lesions              Bartholins and Skenes: normal                 Vagina: normal appearing vagina with normal color and discharge, no lesions.  Second degree cystocele, first degree vault prolapse, and first degree rectocele.               Cervix:  Absent.                 Bimanual Exam:  Uterus:   Absent.              Adnexa: no mass, fullness, tenderness           Pessary removed, cleansed, and given to the patient in a biobag.     Chaperone was present for exam.  ASSESSMENT  Has known second degree cystocele, first degree vault prolapse, first degree rectocele. Urinary urgency.   PLAN  Patient will stop using her pessary.  Referral to Ileana Roup for pelvic floor PT.  Declines medication for overactive bladder until tried PT first.  Declines surgical intervention at this time.  Reduce bladder irritants.   An After Visit Summary was printed and given to the patient.  __25___ minutes face to face time of which over 50% was spent in counseling.

## 2017-11-13 ENCOUNTER — Other Ambulatory Visit: Payer: Self-pay

## 2017-11-13 ENCOUNTER — Ambulatory Visit: Payer: 59 | Admitting: Obstetrics and Gynecology

## 2017-11-13 ENCOUNTER — Encounter: Payer: Self-pay | Admitting: Obstetrics and Gynecology

## 2017-11-13 VITALS — BP 130/68 | HR 78 | Resp 16 | Ht 60.0 in | Wt 158.0 lb

## 2017-11-13 DIAGNOSIS — N8110 Cystocele, unspecified: Secondary | ICD-10-CM | POA: Diagnosis not present

## 2017-11-13 DIAGNOSIS — N816 Rectocele: Secondary | ICD-10-CM | POA: Diagnosis not present

## 2017-11-13 DIAGNOSIS — R3915 Urgency of urination: Secondary | ICD-10-CM | POA: Diagnosis not present

## 2017-11-24 DIAGNOSIS — M3501 Sicca syndrome with keratoconjunctivitis: Secondary | ICD-10-CM | POA: Diagnosis not present

## 2017-11-24 DIAGNOSIS — Z79899 Other long term (current) drug therapy: Secondary | ICD-10-CM | POA: Diagnosis not present

## 2017-11-24 DIAGNOSIS — M359 Systemic involvement of connective tissue, unspecified: Secondary | ICD-10-CM | POA: Diagnosis not present

## 2017-11-24 DIAGNOSIS — M1990 Unspecified osteoarthritis, unspecified site: Secondary | ICD-10-CM | POA: Diagnosis not present

## 2017-11-24 DIAGNOSIS — R768 Other specified abnormal immunological findings in serum: Secondary | ICD-10-CM | POA: Diagnosis not present

## 2017-11-27 DIAGNOSIS — N816 Rectocele: Secondary | ICD-10-CM | POA: Diagnosis not present

## 2017-11-27 DIAGNOSIS — R3915 Urgency of urination: Secondary | ICD-10-CM | POA: Diagnosis not present

## 2017-11-27 DIAGNOSIS — M6281 Muscle weakness (generalized): Secondary | ICD-10-CM | POA: Diagnosis not present

## 2017-11-27 DIAGNOSIS — N8111 Cystocele, midline: Secondary | ICD-10-CM | POA: Diagnosis not present

## 2017-11-27 DIAGNOSIS — N3941 Urge incontinence: Secondary | ICD-10-CM | POA: Diagnosis not present

## 2017-11-27 DIAGNOSIS — M6289 Other specified disorders of muscle: Secondary | ICD-10-CM | POA: Diagnosis not present

## 2017-12-02 DIAGNOSIS — Z6830 Body mass index (BMI) 30.0-30.9, adult: Secondary | ICD-10-CM | POA: Diagnosis not present

## 2017-12-02 DIAGNOSIS — I10 Essential (primary) hypertension: Secondary | ICD-10-CM | POA: Diagnosis not present

## 2017-12-02 MED FILL — HYDROXYCHLOROQUINE 200 MG T: 200 | 30 days supply | Qty: 60 | Fill #2

## 2017-12-02 MED FILL — NP THYROID 60 MG TABLET: 60 | 30 days supply | Qty: 60 | Fill #0

## 2017-12-02 MED FILL — SHIPPING COST: 1 days supply | Qty: 1 | Fill #4

## 2017-12-10 DIAGNOSIS — M6281 Muscle weakness (generalized): Secondary | ICD-10-CM | POA: Diagnosis not present

## 2017-12-10 DIAGNOSIS — N3941 Urge incontinence: Secondary | ICD-10-CM | POA: Diagnosis not present

## 2017-12-10 DIAGNOSIS — N816 Rectocele: Secondary | ICD-10-CM | POA: Diagnosis not present

## 2017-12-10 DIAGNOSIS — M6289 Other specified disorders of muscle: Secondary | ICD-10-CM | POA: Diagnosis not present

## 2017-12-10 DIAGNOSIS — R3915 Urgency of urination: Secondary | ICD-10-CM | POA: Diagnosis not present

## 2017-12-10 DIAGNOSIS — N8111 Cystocele, midline: Secondary | ICD-10-CM | POA: Diagnosis not present

## 2018-01-01 MED FILL — NP THYROID 60 MG TABLET: 60 | 30 days supply | Qty: 60 | Fill #1

## 2018-01-01 MED FILL — PROGESTERONE MICRONIZED 200: 200 | 90 days supply | Qty: 90 | Fill #0

## 2018-01-01 MED FILL — ESTRADIOL 0.5 MG TABLET: 0.5 | 30 days supply | Qty: 30 | Fill #0

## 2018-01-01 MED FILL — SHIPPING COST: 1 days supply | Qty: 1 | Fill #5

## 2018-01-23 DIAGNOSIS — Z79899 Other long term (current) drug therapy: Secondary | ICD-10-CM | POA: Diagnosis not present

## 2018-01-23 DIAGNOSIS — H5203 Hypermetropia, bilateral: Secondary | ICD-10-CM | POA: Diagnosis not present

## 2018-01-27 DIAGNOSIS — N816 Rectocele: Secondary | ICD-10-CM | POA: Diagnosis not present

## 2018-01-27 DIAGNOSIS — N8111 Cystocele, midline: Secondary | ICD-10-CM | POA: Diagnosis not present

## 2018-01-27 DIAGNOSIS — R3915 Urgency of urination: Secondary | ICD-10-CM | POA: Diagnosis not present

## 2018-01-27 DIAGNOSIS — M6289 Other specified disorders of muscle: Secondary | ICD-10-CM | POA: Diagnosis not present

## 2018-01-27 DIAGNOSIS — M6281 Muscle weakness (generalized): Secondary | ICD-10-CM | POA: Diagnosis not present

## 2018-01-27 DIAGNOSIS — N3941 Urge incontinence: Secondary | ICD-10-CM | POA: Diagnosis not present

## 2018-01-30 MED FILL — NP THYROID 120 MG TABLET: 120 | 90 days supply | Qty: 90 | Fill #0

## 2018-01-30 MED FILL — ESTRADIOL 0.5 MG TABLET: 0.5 | 90 days supply | Qty: 90 | Fill #0

## 2018-01-30 MED FILL — SHIPPING COST: 1 days supply | Qty: 1 | Fill #6

## 2018-01-30 MED FILL — HYDROXYCHLOROQUINE SULFATE: 200 | 90 days supply | Qty: 180 | Fill #0

## 2018-02-12 MED FILL — VALSARTAN-HCTZ 160-12.5 MG: 160-12.5 | 90 days supply | Qty: 90 | Fill #2

## 2018-02-12 MED FILL — SHIPPING COST: 1 days supply | Qty: 1 | Fill #7

## 2018-02-19 MED FILL — SHIPPING COST: 1 days supply | Qty: 1 | Fill #8

## 2018-02-19 MED FILL — NP THYROID 90 MG TABLET: 90 | 90 days supply | Qty: 90 | Fill #0

## 2018-02-23 MED FILL — AZITHROMYCIN 250 MG TABLET: 250 | 5 days supply | Qty: 6 | Fill #0

## 2018-04-06 MED FILL — PROGESTERONE 200 MG CAPSULE: 200 | 90 days supply | Qty: 90 | Fill #0

## 2018-04-06 MED FILL — SHIPPING COST: 1 days supply | Qty: 1 | Fill #9

## 2018-04-14 DIAGNOSIS — M25562 Pain in left knee: Secondary | ICD-10-CM | POA: Diagnosis not present

## 2018-05-20 MED FILL — VALSARTAN-HCTZ 160-12.5 MG: 160-12.5 | 90 days supply | Qty: 90 | Fill #3

## 2018-05-20 MED FILL — NP THYROID 90 MG TABLET: 90 | 90 days supply | Qty: 90 | Fill #1

## 2018-05-20 MED FILL — ESTRADIOL 0.5 MG TABS: 0.5 | 90 days supply | Qty: 90 | Fill #1

## 2018-05-20 MED FILL — SHIPPING COST: 1 days supply | Qty: 1 | Fill #10

## 2018-06-23 ENCOUNTER — Other Ambulatory Visit (HOSPITAL_COMMUNITY)
Admission: RE | Admit: 2018-06-23 | Discharge: 2018-06-23 | Disposition: A | Discharge status: Home / Self Care | Payer: No Typology Code available for payment source | Source: Ambulatory Visit | Attending: Internal Medicine | Admitting: Internal Medicine

## 2018-06-23 DIAGNOSIS — A6920 Lyme disease, unspecified: Secondary | ICD-10-CM | POA: Diagnosis not present

## 2018-06-24 LAB — B. BURGDORFI ANTIBODIES: B burgdorferi Ab IgG+IgM: <0.91 {ISR} (ref 0.00–0.90)

## 2018-06-25 MED FILL — MECLIZINE 25 MG TABLET: 25 | 30 days supply | Qty: 30 | Fill #0

## 2018-07-03 MED FILL — RIZATRIPTAN BENZOATE 10 MG: 10 | 30 days supply | Qty: 8 | Fill #0

## 2018-07-07 MED FILL — HYDROXYCHLOROQUINE SULFATE: 200 | 30 days supply | Qty: 60 | Fill #0

## 2018-07-07 MED FILL — SHIPPING COST: 1 days supply | Qty: 1 | Fill #0

## 2018-07-07 MED FILL — PROGESTERONE 200 MG CAPSULE: 200 | 90 days supply | Qty: 90 | Fill #0

## 2018-08-06 ENCOUNTER — Telehealth: Payer: Self-pay | Admitting: *Deleted

## 2018-08-06 NOTE — Telephone Encounter (Signed)
Pt verbalized consent for telehealth appt with Dr Domenic Polite tomorrow. Reviewed pt medications/allergies/pharmacy hx. Pt doesn't have BP monitor but will have HR/weight available.

## 2018-08-07 ENCOUNTER — Telehealth (INDEPENDENT_AMBULATORY_CARE_PROVIDER_SITE_OTHER): Payer: No Typology Code available for payment source | Admitting: Cardiology

## 2018-08-07 ENCOUNTER — Encounter: Payer: Self-pay | Admitting: *Deleted

## 2018-08-07 ENCOUNTER — Encounter: Payer: Self-pay | Admitting: Cardiology

## 2018-08-07 DIAGNOSIS — R002 Palpitations: Secondary | ICD-10-CM

## 2018-08-07 DIAGNOSIS — M359 Systemic involvement of connective tissue, unspecified: Secondary | ICD-10-CM

## 2018-08-07 DIAGNOSIS — I10 Essential (primary) hypertension: Secondary | ICD-10-CM | POA: Diagnosis not present

## 2018-08-07 DIAGNOSIS — Z7189 Other specified counseling: Secondary | ICD-10-CM

## 2018-08-07 NOTE — Progress Notes (Signed)
Virtual Visit via Video Note   This visit type was conducted due to national recommendations for restrictions regarding the COVID-19 Pandemic (e.g. social distancing) in an effort to limit this patient's exposure and mitigate transmission in our community.  Due to her co-morbid illnesses, this patient is at least at moderate risk for complications without adequate follow up.  This format is felt to be most appropriate for this patient at this time.  All issues noted in this document were discussed and addressed.  A limited physical exam was performed with this format.  Please refer to the patient's chart for her consent to telehealth for [facility].   Evaluation Performed:  New patient consultation  Date:  08/07/2018   ID:  Victoria Cooper, Victoria Cooper 1956/01/12, MRN [id number]  Patient Location: Home  Provider Location: Office  PCP:  Deland Pretty, MD  Consulting Cardiologist:  Satira Sark, MD  Chief Complaint:  Palpitations  History of Present Illness:    Victoria Cooper is a 63 y.o. female who presents via audio/video conferencing for a telehealth visit today.  She is referred for cardiology consultation by Dr. Shelia Media for the evaluation of palpitations.  She tells me that several weeks ago she was experiencing an intestinal illness with diarrhea resulting in relative dehydration, was also experiencing migraines at that time.  It was at that point that she was feeling a sense of heart skipping, intermittently over the 3 weeks that she had all of these symptoms together, but worse in the last week and on a daily basis at that time.  She did not have any associated syncope or chest pain with the symptoms.  Since then however, she feels much better and is not bothered by palpitations at this point.  She has had vague palpitations in the past, but they have generally not been very bothersome.  Reportedly, she had an ECG and lab work with Dr. Shelia Media, was told that the ECG was normal.  I  am requesting the results for review.  I reviewed her medications which are outlined below, also medical history.  She is on Plaquenil with a history of undifferentiated connective tissue disease.  Also essential hypertension for which she takes Diovan HCT.  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  She is currently taking some time off of work, plans to visit with a new grandchild in [place].  The patient does not have symptoms concerning for COVID-19 infection (fever, chills, cough, or new shortness of breath).    Past Medical History:  Diagnosis Date  . Abnormal Pap smear of cervix 2006   Repeat Pap normal  . Allergy   . Animal bite 03/18/2011   Dog Bite  . Arthritis   . Asthma   . Classical migraine with intractable migraine 11/30/2015  . Connective tissue disease, undifferentiated ([place]) 2015   Dr. Amil Amen  . GERD (gastroesophageal reflux disease)   . Hyperlipidemia   . Hypertension   . Migraine   . Wears glasses    Past Surgical History:  Procedure Laterality Date  . ABDOMINAL HYSTERECTOMY  2003   hyst  . BACK SURGERY  2009   lumb lam-fusion  . Basil cell    . BREAST REDUCTION SURGERY  05/26/2012   Procedure: MAMMARY REDUCTION  (BREAST);  Surgeon: Erline Hau, MD;  Location: [facility];  Service: Plastics;  Laterality: Bilateral;  . COLONOSCOPY  2003, 2018   diverticulosis     Current Meds  Medication Sig  .  Cholecalciferol (VITAMIN D PO) Take by mouth.  . estradiol (ESTRACE) 0.5 MG tablet   . hydroxychloroquine (PLAQUENIL) 200 MG tablet Take by mouth daily.  Marland Kitchen MELATONIN PO Take by mouth at bedtime.  . NP THYROID 90 MG tablet   . progesterone (PROMETRIUM) 200 MG capsule   . valsartan-hydrochlorothiazide (DIOVAN HCT) 160-12.5 MG per tablet Take 1 tablet by mouth daily.     Allergies:   Morphine and related and Sulfa antibiotics   Social History   Tobacco Use  . Smoking status: Never Smoker  . Smokeless tobacco: Never Used   Substance Use Topics  . Alcohol use: Yes    Alcohol/week: 0.0 - 2.0 standard drinks  . Drug use: No     Family Hx: The patient's family history includes ALS in her maternal grandfather; Brain cancer in her paternal grandmother; Heart disease in her father; Heart failure in her maternal grandmother; Lung cancer in her mother; Melanoma in her paternal grandmother; Stroke in her paternal grandfather; Ulcerative colitis in her brother. There is no history of Migraines or Colon cancer.  ROS:   Please see the history of present illness.    Intermittent arthritic symptoms. All other systems reviewed and are negative.   Prior CV studies:   The following studies were reviewed today:  Prior cardiac testing.  Labs/Other Tests and Data Reviewed:    EKG: I personally reviewed the tracing from 05/22/2012 which showed normal sinus rhythm with borderline increased voltage.  Recent Labs:  Lab work being requested from Dr. Shelia Media.  Wt Readings from Last 3 Encounters:  08/07/18 160 lb (72.6 kg)  11/13/17 158 lb (71.7 kg)  11/06/17 160 lb (72.6 kg)     Objective:    Vital Signs:  Ht 5\' 2"  (1.575 m)   Wt 160 lb (72.6 kg)   LMP 04/29/2001 (LMP Unknown)   BMI 29.26 kg/m    Heart rate today 78 bpm per patient report. Victoria Cooper appears comfortable, speaking with me from outdoors on her porch. Skin color is normal. She speaks in full sentences, spontaneously, no shortness of breath at rest. Affect is appropriate.   ASSESSMENT & PLAN:    1.  History of palpitations as discussed above.  Symptoms have since improved and it sounds like were exacerbated by an ongoing illness.  I am requesting her ECG from Dr. Shelia Media for review, also lab work.  If her ECG is unremarkable, would adopt a strategy of observation at this time rather than pursuing a cardiac monitor in the absence of symptoms.  If she has another flare of palpitations, we would consider further cardiac monitoring at that point.  Her  heart rate is normal today.  2.  Essential hypertension, on Hyzaar HCT.  3.  Undifferentiated connective tissue disease, followed by rheumatology on Plaquenil.  4.  General screening for COVID-19 is negative.  She is practicing social distancing.  Temporarily spending time away from work as well.  COVID-19 Education: The signs and symptoms of COVID-19 were discussed with the patient and how to seek care for testing (follow up with PCP or arrange E-visit).  The importance of social distancing was discussed today.  Time:   Today, I have spent 15 minutes with the patient with telehealth technology discussing the above problems.     Medication Adjustments/Labs and Tests Ordered: Current medicines are reviewed at length with the patient today.  Concerns regarding medicines are outlined above.  Tests Ordered: No orders of the defined types were placed in this  encounter.  Medication Changes: No orders of the defined types were placed in this encounter.   Disposition:  Follow up 6 months  Signed, Rozann Lesches, MD  08/07/2018 1:53 PM    [facility]

## 2018-08-07 NOTE — Patient Instructions (Signed)
Medication Instructions: Your physician recommends that you continue on your current medications as directed. Please refer to the Current Medication list given to you today.   Labwork: None today  Procedures/Testing: None today  Follow-Up: 6 months with dr.McDowell in the [place] office  Any Additional Special Instructions Will Be Listed Below (If Applicable).     If you need a refill on your cardiac medications before your next appointment, please call your pharmacy.       Thank you for choosing [facility] !

## 2018-08-12 MED FILL — NP THYROID 90 MG TABLET: 90 | 90 days supply | Qty: 90 | Fill #0

## 2018-08-12 MED FILL — ESTRADIOL 0.5 MG TABS: 0.5 | 90 days supply | Qty: 90 | Fill #0

## 2018-08-13 ENCOUNTER — Telehealth: Payer: Self-pay | Admitting: *Deleted

## 2018-08-13 NOTE — Telephone Encounter (Signed)
-----   Message from Satira Sark, MD sent at 08/12/2018  9:46 AM EDT ----- Results reviewed.  Patient seen recently in consultation for palpitations.  I reviewed the tracing which shows sinus rhythm, a few ectopic beats.  As we discussed, would hold off on monitoring since she is feeling better at this point.  Can reconsider event recorder if she starts to have worsening palpitations.  Office follow-up already arranged.

## 2018-08-13 NOTE — Telephone Encounter (Signed)
Patient informed and verbalized understanding

## 2018-08-14 MED FILL — VALSARTAN-HCTZ 160-12.5 MG: 160-12.5 | 30 days supply | Qty: 30 | Fill #0

## 2018-08-14 MED FILL — HYDROXYCHLOROQUINE 200 MG: 200 | 30 days supply | Qty: 60 | Fill #0

## 2018-09-30 ENCOUNTER — Telehealth: Payer: Self-pay | Admitting: Obstetrics and Gynecology

## 2018-09-30 NOTE — Telephone Encounter (Signed)
Left message on voicemail to call and reschedule cancelled appointment. °

## 2018-10-02 MED FILL — HYDROXYCHLOROQUINE SULFATE: 200 | 30 days supply | Qty: 60 | Fill #0

## 2018-10-02 MED FILL — VALSARTAN-HCTZ 160-12.5 MG: 160-12.5 | 30 days supply | Qty: 30 | Fill #1

## 2018-10-03 MED FILL — PROGESTERONE 200 MG CAPSULE: 200 | 90 days supply | Qty: 90 | Fill #0

## 2018-11-03 MED FILL — ESTRADIOL 0.5 MG TABS: 0.5 | 90 days supply | Qty: 90 | Fill #1

## 2018-11-03 MED FILL — VALSARTAN-HCTZ 160-12.5 MG: 160-12.5 | 30 days supply | Qty: 30 | Fill #2

## 2018-11-06 ENCOUNTER — Ambulatory Visit: Payer: Self-pay | Admitting: Obstetrics and Gynecology

## 2018-12-05 MED FILL — VALSARTAN-HCTZ 160-12.5 MG: 160-12.5 | 30 days supply | Qty: 30 | Fill #3

## 2018-12-07 MED FILL — HYDROXYCHLOROQUINE 200 MG T: 200 | 30 days supply | Qty: 60 | Fill #0

## 2019-01-07 ENCOUNTER — Ambulatory Visit (INDEPENDENT_AMBULATORY_CARE_PROVIDER_SITE_OTHER): Payer: No Typology Code available for payment source | Admitting: Internal Medicine

## 2019-01-08 ENCOUNTER — Other Ambulatory Visit (INDEPENDENT_AMBULATORY_CARE_PROVIDER_SITE_OTHER): Payer: Self-pay | Admitting: Internal Medicine

## 2019-01-08 MED FILL — PROGESTERONE 200 MG CAPSULE: 200 | 90 days supply | Qty: 90 | Fill #0

## 2019-01-08 MED FILL — VALSARTAN-HCTZ 160-12.5 MG: 160-12.5 | 30 days supply | Qty: 30 | Fill #4

## 2019-01-08 MED FILL — NP THYROID 90 MG TABLET: 90 | 90 days supply | Qty: 90 | Fill #1

## 2019-01-11 ENCOUNTER — Other Ambulatory Visit (INDEPENDENT_AMBULATORY_CARE_PROVIDER_SITE_OTHER): Payer: Self-pay

## 2019-01-11 MED ORDER — PROGESTERONE MICRONIZED 200 MG PO CAPS: 200.0000 mg | ORAL_CAPSULE | Freq: Every evening | ORAL | 0 refills | Status: DC

## 2019-01-13 ENCOUNTER — Ambulatory Visit (INDEPENDENT_AMBULATORY_CARE_PROVIDER_SITE_OTHER): Payer: No Typology Code available for payment source | Admitting: Internal Medicine

## 2019-01-13 ENCOUNTER — Encounter (INDEPENDENT_AMBULATORY_CARE_PROVIDER_SITE_OTHER): Payer: Self-pay | Admitting: Internal Medicine

## 2019-01-13 ENCOUNTER — Other Ambulatory Visit: Payer: Self-pay

## 2019-01-13 VITALS — BP 122/70 | HR 72 | Ht 61.0 in | Wt 162.8 lb

## 2019-01-13 DIAGNOSIS — I10 Essential (primary) hypertension: Secondary | ICD-10-CM

## 2019-01-13 DIAGNOSIS — E559 Vitamin D deficiency, unspecified: Secondary | ICD-10-CM

## 2019-01-13 DIAGNOSIS — E2839 Other primary ovarian failure: Secondary | ICD-10-CM | POA: Diagnosis not present

## 2019-01-13 HISTORY — DX: Vitamin D deficiency, unspecified: E55.9

## 2019-01-13 HISTORY — DX: Other primary ovarian failure: E28.39

## 2019-01-13 MED ORDER — ESTRADIOL 0.5 MG PO TABS: 0.5000 mg | ORAL_TABLET | Freq: Every day | ORAL | 1 refills | Status: DC

## 2019-01-13 NOTE — Progress Notes (Signed)
Wellness Office Visit  Subjective:  Patient ID: Victoria Cooper, female    DOB: 1956/03/08  Age: 63 y.o. MRN: [id number]  CC: This lady comes in for follow-up of her wellness with particular attention to hypertension, vitamin D deficiency, hypogonadism with menopausal symptoms. HPI Overall she is doing reasonably well but she has been troubled with migraines for the last 3 weeks or so.  There are some stressors but not too many.  She certainly gets triggering of her migraines with allergies and I wonder if this is the issue. She continues with bioidentical hormone therapy as indicated below. She is not exercising on a regular basis and her nutrition is not consistent at the present time.  Past Medical History:  Diagnosis Date   Abnormal Pap smear of cervix 2006   Repeat Pap normal   Allergy    Animal bite 03/18/2011   Dog Bite   Arthritis    Asthma    Classical migraine with intractable migraine 11/30/2015   Connective tissue disease, undifferentiated ([place]) 2015   Dr. Amil Amen   Female hypogonadism syndrome 01/13/2019   GERD (gastroesophageal reflux disease)    Hyperlipidemia    Hypertension    Migraine    Vitamin D deficiency disease 01/13/2019   Wears glasses       Family History  Problem Relation Age of Onset   Heart disease Father    Lung cancer Mother        never smoker   ALS Maternal Grandfather    Melanoma Paternal Grandmother    Brain cancer Paternal Grandmother    Stroke Paternal Grandfather    Heart failure Maternal Grandmother    Ulcerative colitis Brother    Migraines Neg Hx    Colon cancer Neg Hx     Social History   Social History Narrative   Lives at home w/ her husband   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   estradiol (ESTRACE) 0.5 MG  tablet Take 1 tablet (0.5 mg total) by mouth daily.   hydroxychloroquine (PLAQUENIL) 200 MG tablet Take by mouth daily.   MELATONIN PO Take by mouth at bedtime.   NP THYROID 90 MG tablet Take 90 mg by mouth daily.    progesterone (PROMETRIUM) 200 MG capsule Take 1 capsule (200 mg total) by mouth every evening.   Testosterone 20 % CREA Apply 2.5 mg topically daily. Compounded   valsartan-hydrochlorothiazide (DIOVAN HCT) 160-12.5 MG per tablet Take 1 tablet by mouth daily.   [DISCONTINUED] estradiol (ESTRACE) 0.5 MG tablet TAKE 1 TABLET BY MOUTH ONCE A DAY     Nutrition  She want to go on to weight watchers but has not done so consistently.  She is not fasting like she used to. Sleep  Her sleep is an issue and taking melatonin over-the-counter is not helped her.  Exercise  She is not exercising on a regular basis although she does try to walk when she can. Bio Identical Hormones  Testosterone therapy is being used off label for symptoms of testosterone deficiency and benefits that it produces based on several studies.  These benefits include decreasing body fat, increasing in lean muscle mass and increasing in bone density.  There is improvement of memory, cognition.  There is improvement in exercise tolerance  and endurance.  Testosterone therapy has also been shown to be protective against coronary artery disease, cerebrovascular disease, diabetes, hypertension and degenerative joint disease. I have discussed with the patient the FDA warnings regarding testosterone therapy, benefits and side effects and modes of administration as well as monitoring blood levels and side effects  on a regular basis The patient is agreeable that testosterone therapy should be an integral part of his/her wellness,quality of life and prevention of chronic disease.  Estradiol is being used in this patient for multiple benefits based on several studies including protection against heart disease,  cerebrovascular disease, osteoporosis, colon cancer, Alzheimer's disease, macular degeneration and cataracts. The patient has been counseled regarding benefits and side effects and modes of administration. The patient is agreeable that this therapy is an integral to part of her wellness, quality of life and prevention of chronic disease.  Micronized progesterone is being used in this patient for multiple benefits based on studies including protection against uterine cancer, breast cancer, osteoporosis and heart disease. The patient has been counseled regarding side effects, benefits and modes of administration. The patient is agreeable that this therapy is an integral part of her wellness, quality of life and prevention of chronic disease.  Objective:   Today's Vitals: BP 122/70    Pulse 72    Ht 5\' 1"  (1.549 m)    Wt 162 lb 12.8 oz (73.8 kg)    LMP 04/29/2001 (LMP Unknown)    BMI 30.76 kg/m  Vitals with BMI 01/13/2019 08/07/2018 11/13/2017  Height 5\' 1"  5\' 2"  5\' 0"   Weight 162 lbs 13 oz 160 lbs 158 lbs  BMI 30.78 [id number]  Systolic [id number] - [id number]  Diastolic 70 - 68  Pulse 72 - 78     Physical Exam  She looks systemically well.  Blood pressure is well controlled today.  Her weight is stable.  No new physical findings.     Assessment   1. Essential hypertension, benign   2. Female hypogonadism syndrome   3. Vitamin D deficiency disease      Plan: 1. She will continue with all medications for chronic conditions above. 2. I will see her in 6 months time for follow-up.  I have suggested she try over-the-counter allergy medicines to see if this will reduce her migrainous headaches as this possibly may be a trigger.  Otherwise, she may want to go and revisit the neurologist as she did approximately 3 years ago.  Tests ordered No orders of the defined types were placed in this encounter.    Doree Albee, MD

## 2019-01-13 NOTE — Patient Instructions (Signed)

## 2019-01-20 MED FILL — ESTRADIOL 0.5 MG TABS: 0.5 | 90 days supply | Qty: 90 | Fill #0

## 2019-02-03 MED FILL — VALSARTAN-HCTZ 160-12.5 MG: 160-12.5 | 30 days supply | Qty: 30 | Fill #5

## 2019-02-15 ENCOUNTER — Encounter: Payer: Self-pay | Admitting: Cardiology

## 2019-02-15 ENCOUNTER — Other Ambulatory Visit: Payer: Self-pay

## 2019-02-15 ENCOUNTER — Ambulatory Visit (INDEPENDENT_AMBULATORY_CARE_PROVIDER_SITE_OTHER): Payer: No Typology Code available for payment source | Admitting: Cardiology

## 2019-02-15 VITALS — BP 126/78 | HR 80 | Temp 97.5°F | Ht 62.0 in | Wt 162.0 lb

## 2019-02-15 DIAGNOSIS — Z23 Encounter for immunization: Secondary | ICD-10-CM

## 2019-02-15 DIAGNOSIS — I10 Essential (primary) hypertension: Secondary | ICD-10-CM

## 2019-02-15 DIAGNOSIS — R011 Cardiac murmur, unspecified: Secondary | ICD-10-CM

## 2019-02-15 DIAGNOSIS — R002 Palpitations: Secondary | ICD-10-CM | POA: Diagnosis not present

## 2019-02-15 NOTE — Patient Instructions (Signed)
Medication Instructions:  Your physician recommends that you continue on your current medications as directed. Please refer to the Current Medication list given to you today.  *If you need a refill on your cardiac medications before your next appointment, please call your pharmacy*  Lab Work: none If you have labs (blood work) drawn today and your tests are completely normal, you will receive your results only by: Marland Kitchen MyChart Message (if you have MyChart) OR . A paper copy in the mail If you have any lab test that is abnormal or we need to change your treatment, we will call you to review the results.  Testing/Procedures: Your physician has requested that you have an echocardiogram. Echocardiography is a painless test that uses sound waves to create images of your heart. It provides your doctor with information about the size and shape of your heart and how well your heart's chambers and valves are working. This procedure takes approximately one hour. There are no restrictions for this procedure.    Follow-Up: At [facility], you and your health needs are our priority.  As part of our continuing mission to provide you with exceptional heart care, we have created designated Provider Care Teams.  These Care Teams include your primary Cardiologist (physician) and Advanced Practice Providers (APPs -  Physician Assistants and Nurse Practitioners) who all work together to provide you with the care you need, when you need it.  Your next appointment:   12 months  The format for your next appointment:   In Person  Provider:   Rozann Lesches, MD  Other Instructions None

## 2019-02-15 NOTE — Progress Notes (Signed)
Cardiology Office Note  Date: 02/15/2019   ID: Victoria Cooper, Victoria Cooper 1955/07/03, MRN [id number]  PCP:  Deland Pretty, MD  Cardiologist:  Rozann Lesches, MD Electrophysiologist:  None   Chief Complaint  Patient presents with  . Cardiac follow-up    History of Present Illness: Victoria Cooper is a 63 y.o. female assessed via telehealth encounter back in April.  She presents for a routine visit.  She just got back from a one week vacation [place] members [place] the beach.  She states that she feels like the palpitations she was experiencing before or worse at times when she had not slept well or had not been adequately hydrating.  She also stopped taking a supplement that had chromium and it feeling like it may have been related to her palpitations.  She does not report any sudden dizziness or syncope, no exertional chest pain or unusual degree of shortness of breath with typical activities.  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  I reviewed her medications and lab work as outlined below.  Past Medical History:  Diagnosis Date  . Abnormal Pap smear of cervix 2006   Repeat Pap normal  . Allergy   . Animal bite 03/18/2011   Dog Bite  . Arthritis   . Asthma   . Classical migraine with intractable migraine 11/30/2015  . Connective tissue disease, undifferentiated ([place]) 2015   Dr. Amil Amen  . Female hypogonadism syndrome 01/13/2019  . GERD (gastroesophageal reflux disease)   . Hyperlipidemia   . Hypertension   . Migraine   . Vitamin D deficiency disease 01/13/2019  . Wears glasses     Past Surgical History:  Procedure Laterality Date  . ABDOMINAL HYSTERECTOMY  2003   hyst  . BACK SURGERY  2009   lumb lam-fusion  . Basil cell    . BREAST REDUCTION SURGERY  05/26/2012   Procedure: MAMMARY REDUCTION  (BREAST);  Surgeon: Erline Hau, MD;  Location: [facility];  Service: Plastics;  Laterality: Bilateral;  . COLONOSCOPY  2003, 2018    diverticulosis    Current Outpatient Medications  Medication Sig Dispense Refill  . Cholecalciferol (VITAMIN D PO) Take by mouth.    . Cholecalciferol (VITAMIN D-3) 125 MCG (5000 UT) TABS Take 10,000 Units by mouth daily.    Marland Kitchen estradiol (ESTRACE) 0.5 MG tablet Take 1 tablet (0.5 mg total) by mouth daily. 90 tablet 1  . hydroxychloroquine (PLAQUENIL) 200 MG tablet Take by mouth daily.    Marland Kitchen MELATONIN PO Take by mouth at bedtime.    . NP THYROID 90 MG tablet Take 90 mg by mouth daily.   2  . progesterone (PROMETRIUM) 200 MG capsule Take 1 capsule (200 mg total) by mouth every evening. 90 capsule 0  . Testosterone 20 % CREA Apply 2.5 mg topically daily. Compounded    . valsartan-hydrochlorothiazide (DIOVAN HCT) 160-12.5 MG per tablet Take 1 tablet by mouth daily. 30 tablet 11   No current facility-administered medications for this visit.    Allergies:  Morphine and related and Sulfa antibiotics   Social History: The patient  reports that she has never smoked. She has never used smokeless tobacco. She reports current alcohol use. She reports that she does not use drugs.   ROS:  Please see the history of present illness. Otherwise, complete review of systems is positive for none.  All other systems are reviewed and negative.   Physical Exam: VS:  BP  126/78 (BP Location: Right Arm)   Pulse 80   Temp (!) 97.5 F (36.4 C) (Temporal)   Ht 5\' 2"  (1.575 m)   Wt 162 lb (73.5 kg)   LMP 04/29/2001 (LMP Unknown)   SpO2 98%   BMI 29.63 kg/m , BMI Body mass index is 29.63 kg/m.  Wt Readings from Last 3 Encounters:  02/15/19 162 lb (73.5 kg)  01/13/19 162 lb 12.8 oz (73.8 kg)  08/07/18 160 lb (72.6 kg)    General: Patient appears comfortable at rest. HEENT: Conjunctiva and lids normal, wearing a mask. Neck: Supple, no elevated JVP or carotid bruits, no thyromegaly. Lungs: Clear to auscultation, nonlabored breathing at rest. Cardiac: Regular rate and rhythm, no S3, 2/6 basal systolic  murmur, no pericardial rub. Abdomen: Soft, nontender, bowel sounds present. Extremities: No pitting edema, distal pulses 2+. Skin: Warm and dry. Musculoskeletal: No kyphosis. Neuropsychiatric: Alert and oriented x3, affect grossly appropriate.  ECG:  An ECG dated 07/03/2018 was personally reviewed today and demonstrated:  Sinus rhythm with PAC.  Recent Labwork:  March 2020: Cholesterol 221, HDL 62, LDL 139, triglycerides 98, AST 21, BUN 12, creatinine 0.66, potassium 4.0, hemoglobin 14.2, platelets 209  Assessment and Plan:  1.  History of palpitations, these are infrequent at this point without progressive pattern.  I reviewed her ECG from March.  Our plan is observation for now.  If symptoms worsen we will pursue a Zio patch monitor.  2.  Heart murmur on examination, most likely benign however she has not had an echocardiogram previously.  This will be arranged.  3.  Essential hypertension, on Diovan HCT.  Blood pressure is adequately controlled today.  Medication Adjustments/Labs and Tests Ordered: Current medicines are reviewed at length with the patient today.  Concerns regarding medicines are outlined above.   Tests Ordered: Orders Placed This Encounter  Procedures  . ECHOCARDIOGRAM COMPLETE    Medication Changes: No orders of the defined types were placed in this encounter.   Disposition:  Follow up 1 year in the [place] office.  Signed, Satira Sark, MD, [facility] 02/15/2019 9:01 AM    [facility] [address] [place], [place] [address] Phone: [phone]; Fax: [phone]

## 2019-02-26 ENCOUNTER — Other Ambulatory Visit: Payer: Self-pay

## 2019-02-26 ENCOUNTER — Ambulatory Visit (HOSPITAL_COMMUNITY)
Admission: RE | Admit: 2019-02-26 | Discharge: 2019-02-26 | Disposition: A | Discharge status: Home / Self Care | Payer: No Typology Code available for payment source | Source: Ambulatory Visit | Attending: Cardiology | Admitting: Cardiology

## 2019-02-26 DIAGNOSIS — R011 Cardiac murmur, unspecified: Secondary | ICD-10-CM | POA: Insufficient documentation

## 2019-02-26 NOTE — Progress Notes (Signed)
*  PRELIMINARY RESULTS* Echocardiogram 2D Echocardiogram has been performed.  Victoria Cooper 02/26/2019, 1:47 PM

## 2019-03-17 MED FILL — VALSARTAN-HCTZ 160-12.5 MG: 160-12.5 | 30 days supply | Qty: 30 | Fill #6

## 2019-03-17 MED FILL — HYDROXYCHLOROQUINE 200 MG T: 200 | 30 days supply | Qty: 60 | Fill #1

## 2019-04-16 ENCOUNTER — Other Ambulatory Visit (INDEPENDENT_AMBULATORY_CARE_PROVIDER_SITE_OTHER): Payer: Self-pay | Admitting: Internal Medicine

## 2019-04-16 MED FILL — VALSARTAN-HCTZ 160-12.5 MG: 160-12.5 | 30 days supply | Qty: 30 | Fill #7

## 2019-04-16 MED FILL — PROGESTERONE 200 MG CAPSULE: 200 | 90 days supply | Qty: 90 | Fill #0

## 2019-04-16 MED FILL — ESTRADIOL 0.5 MG TABS: 0.5 | 90 days supply | Qty: 90 | Fill #1

## 2019-04-17 MED FILL — NP THYROID 90 MG TABLET: 90 | 90 days supply | Qty: 90 | Fill #0

## 2019-04-19 MED FILL — HYDROXYCHLOROQUINE 200 MG T: 200 | 30 days supply | Qty: 60 | Fill #0

## 2019-05-21 MED FILL — VALSARTAN-HCTZ 160-12.5 MG: 160-12.5 | 30 days supply | Qty: 30 | Fill #8

## 2019-06-23 MED FILL — VALSARTAN-HCTZ 160-12.5 MG: 160-12.5 | 30 days supply | Qty: 30 | Fill #9

## 2019-07-19 ENCOUNTER — Other Ambulatory Visit (INDEPENDENT_AMBULATORY_CARE_PROVIDER_SITE_OTHER): Payer: Self-pay | Admitting: Internal Medicine

## 2019-07-19 MED FILL — VALSARTAN-HCTZ 160-12.5 MG: 160-12.5 | 30 days supply | Qty: 30 | Fill #10

## 2019-07-19 MED FILL — PROGESTERONE MICRONIZED 200: 200 | 90 days supply | Qty: 90 | Fill #0

## 2019-07-19 MED FILL — ESTRADIOL 0.5 MG TABS: 0.5 | 90 days supply | Qty: 90 | Fill #0

## 2019-07-19 MED FILL — HYDROXYCHLOROQUINE 200 MG T: 200 | 30 days supply | Qty: 60 | Fill #0

## 2019-07-22 ENCOUNTER — Ambulatory Visit (INDEPENDENT_AMBULATORY_CARE_PROVIDER_SITE_OTHER): Payer: No Typology Code available for payment source | Admitting: Internal Medicine

## 2019-08-19 ENCOUNTER — Ambulatory Visit (INDEPENDENT_AMBULATORY_CARE_PROVIDER_SITE_OTHER): Payer: No Typology Code available for payment source | Admitting: Internal Medicine

## 2019-08-26 ENCOUNTER — Other Ambulatory Visit (HOSPITAL_COMMUNITY): Payer: Self-pay | Admitting: Internal Medicine

## 2019-08-26 MED FILL — HYDROXYCHLOROQUINE 200 MG T: 200 | 30 days supply | Qty: 60 | Fill #1

## 2019-08-26 MED FILL — VALSARTAN-HCTZ 160-12.5 MG: 160-12.5 | 90 days supply | Qty: 90 | Fill #0

## 2019-08-26 MED FILL — NP THYROID 90 MG TABLET: 90 | 90 days supply | Qty: 90 | Fill #1

## 2019-11-08 ENCOUNTER — Other Ambulatory Visit (INDEPENDENT_AMBULATORY_CARE_PROVIDER_SITE_OTHER): Payer: Self-pay | Admitting: Internal Medicine

## 2019-11-08 MED FILL — PROGESTERONE 200 MG CAPS: 200 | 90 days supply | Qty: 90 | Fill #0

## 2019-11-08 MED FILL — ESTRADIOL 0.5 MG TABS: 0.5 | 90 days supply | Qty: 90 | Fill #1

## 2019-11-08 MED FILL — HYDROXYCHLOROQUINE 200 MG T: 200 | 30 days supply | Qty: 60 | Fill #0

## 2019-11-18 ENCOUNTER — Other Ambulatory Visit: Payer: Self-pay

## 2019-11-18 ENCOUNTER — Encounter (INDEPENDENT_AMBULATORY_CARE_PROVIDER_SITE_OTHER): Payer: Self-pay | Admitting: Internal Medicine

## 2019-11-18 ENCOUNTER — Ambulatory Visit (INDEPENDENT_AMBULATORY_CARE_PROVIDER_SITE_OTHER): Payer: No Typology Code available for payment source | Admitting: Internal Medicine

## 2019-11-18 VITALS — BP 120/80 | HR 88 | Temp 96.8°F | Ht 62.0 in | Wt 159.8 lb

## 2019-11-18 DIAGNOSIS — E559 Vitamin D deficiency, unspecified: Secondary | ICD-10-CM

## 2019-11-18 DIAGNOSIS — E2839 Other primary ovarian failure: Secondary | ICD-10-CM

## 2019-11-18 DIAGNOSIS — I10 Essential (primary) hypertension: Secondary | ICD-10-CM | POA: Diagnosis not present

## 2019-11-18 MED ORDER — NONFORMULARY OR COMPOUNDED ITEM: 5.0000 mg | 3 refills | Status: DC

## 2019-11-18 NOTE — Progress Notes (Signed)
Metrics: Intervention Frequency ACO  Documented Smoking Status Yearly  Screened one or more times in 24 months  Cessation Counseling or  Active cessation medication Past 24 months  Past 24 months   Guideline developer: UpToDate (See UpToDate for funding source) Date Released: 2014       Wellness Office Visit  Subjective:  Patient ID: Victoria Cooper, female    DOB: [birth year]  Age: 64 y.o. MRN: [id number]  CC: This delightful lady comes in for follow-up of menopausal symptoms, hypertension and vitamin D deficiency. HPI  She tells me the testosterone cream continues to give her acne and she would like to switch back to the testosterone injections that she was doing previously. She continues on progesterone and estradiol and she is tolerating these doses. She also is hypertensive and takes Diovan HCT.  Past Medical History:  Diagnosis Date  . Abnormal Pap smear of cervix 2006   Repeat Pap normal  . Allergy   . Animal bite 03/18/2011   Dog Bite  . Arthritis   . Asthma   . Classical migraine with intractable migraine 11/30/2015  . Connective tissue disease, undifferentiated ([place]) 2015   Dr. Amil Amen  . Female hypogonadism syndrome 01/13/2019  . GERD (gastroesophageal reflux disease)   . Hyperlipidemia   . Hypertension   . Migraine   . Vitamin D deficiency disease 01/13/2019  . Wears glasses    Past Surgical History:  Procedure Laterality Date  . ABDOMINAL HYSTERECTOMY  2003   hyst  . BACK SURGERY  2009   lumb lam-fusion  . Basil cell    . BREAST REDUCTION SURGERY  05/26/2012   Procedure: MAMMARY REDUCTION  (BREAST);  Surgeon: Erline Hau, MD;  Location: [facility];  Service: Plastics;  Laterality: Bilateral;  . COLONOSCOPY  2003, 2018   diverticulosis     Family History  Problem Relation Age of Onset  . Heart disease Father   . Lung cancer Mother        never smoker  . ALS Maternal Grandfather   . Melanoma Paternal Grandmother   . Brain  cancer Paternal Grandmother   . Stroke Paternal Grandfather   . Heart failure Maternal Grandmother   . Ulcerative colitis Brother   . Migraines Neg Hx   . 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 Smoking status: Never Smoker  . Smokeless tobacco: Never Used  Substance Use Topics  . Alcohol use: Yes    Alcohol/week: 0.0 - 2.0 standard drinks    Current Meds  Medication Sig  . Cholecalciferol (VITAMIN D-3) 125 MCG (5000 UT) TABS Take 10,000 Units by mouth daily.  Marland Kitchen estradiol (ESTRACE) 0.5 MG tablet Take 1 tablet (0.5 mg total) by mouth daily.  . hydroxychloroquine (PLAQUENIL) 200 MG tablet Take by mouth daily.  Marland Kitchen MELATONIN PO Take by mouth at bedtime.  . NP THYROID 90 MG tablet TAKE 1 TABLET BY MOUTH ONCE A DAY  . progesterone (PROMETRIUM) 200 MG capsule TAKE 1 CAPSULE (200 MG TOTAL) BY MOUTH EVERY EVENING.  . valsartan-hydrochlorothiazide (DIOVAN HCT) 160-12.5 MG per tablet Take 1 tablet by mouth daily.  . [DISCONTINUED] Testosterone 20 % CREA Apply 2.5 mg topically daily. Compounded      No flowsheet data  found.   Objective:   Today's Vitals: BP 120/80 (BP Location: Right Arm, Patient Position: Sitting, Cuff Size: Normal)   Pulse 88   Temp (!) 96.8 F (36 C) (Temporal)   Ht 5\' 2"  (1.575 m)   Wt 159 lb 12.8 oz (72.5 kg)   LMP 04/29/2001 (LMP Unknown)   SpO2 97%   BMI 29.23 kg/m  Vitals with BMI 11/18/2019 02/15/2019 01/13/2019  Height 5\' 2"  5\' 2"  5\' 1"   Weight 159 lbs 13 oz 162 lbs 162 lbs 13 oz  BMI 29.22 47.34 03.70  Systolic 964 383 818  Diastolic 80 78 70  Pulse 88 80 72     Physical Exam  She looks systemically well.  Blood pressure excellent.  No new physical findings.     Assessment   1. Essential hypertension, benign   2. Female hypogonadism syndrome   3. Vitamin  D deficiency disease       Tests ordered No orders of the defined types were placed in this encounter.    Plan: 1. We will discontinue testosterone cream and start her back on testosterone injection.  [facility] 5 mg intravenously once a week. 2. She will continue with estradiol and progesterone as before. 3. I will see her in about 2 months time and we will update her blood work then.   Meds ordered this encounter  Medications  . NONFORMULARY OR COMPOUNDED ITEM    Sig: Inject 5 mg into the muscle every 7 (seven) days. Testosterone cypionate in olive  oil (25 mg/mL).  Dispense 5 mL vial.    Dispense:  1 each    Refill:  3    Megean Fabio Luther Parody, MD

## 2019-12-08 ENCOUNTER — Other Ambulatory Visit (INDEPENDENT_AMBULATORY_CARE_PROVIDER_SITE_OTHER): Payer: Self-pay | Admitting: Internal Medicine

## 2019-12-08 MED FILL — NP THYROID 90 MG TABLET: 90 | 90 days supply | Qty: 90 | Fill #0

## 2019-12-08 MED FILL — HYDROXYCHLOROQUINE 200 MG T: 200 | 30 days supply | Qty: 60 | Fill #1

## 2019-12-08 MED FILL — VALSARTAN-HCTZ 160-12.5 MG: 160-12.5 | 90 days supply | Qty: 90 | Fill #1

## 2020-01-20 ENCOUNTER — Ambulatory Visit (INDEPENDENT_AMBULATORY_CARE_PROVIDER_SITE_OTHER): Payer: No Typology Code available for payment source | Admitting: Internal Medicine

## 2020-03-15 ENCOUNTER — Other Ambulatory Visit (INDEPENDENT_AMBULATORY_CARE_PROVIDER_SITE_OTHER): Payer: Self-pay | Admitting: Internal Medicine

## 2020-03-15 ENCOUNTER — Telehealth (INDEPENDENT_AMBULATORY_CARE_PROVIDER_SITE_OTHER): Payer: Self-pay

## 2020-03-15 MED ORDER — ESTRADIOL 0.5 MG PO TABS: 0.5000 mg | ORAL_TABLET | Freq: Every day | ORAL | 1 refills | Status: DC

## 2020-03-15 MED FILL — NP THYROID 90 MG TABLET: 90 | 90 days supply | Qty: 90 | Fill #1

## 2020-03-15 MED FILL — VALSARTAN-HCTZ 160-12.5 MG: 160-12.5 | 90 days supply | Qty: 90 | Fill #2

## 2020-03-15 MED FILL — PROGESTERONE 200 MG CAPS: 200 | 90 days supply | Qty: 90 | Fill #0

## 2020-03-15 MED FILL — ESTRADIOL 0.5 MG TABS: 0.5 | 90 days supply | Qty: 90 | Fill #0

## 2020-03-15 MED FILL — HYDROXYCHLOROQUINE 200 MG T: 200 | 30 days supply | Qty: 60 | Fill #0

## 2020-03-15 NOTE — Telephone Encounter (Signed)
Received a fax from [facility] for a refill for the following medication:  estradiol (ESTRACE) 0.5 MG tablet Last filled 01/13/2019, # 90 with 1 refill  Last OV 11/18/2019

## 2020-03-15 NOTE — Telephone Encounter (Signed)
An additional fax came through for a request for the following medication:  progesterone (PROMETRIUM) 200 MG capsule  This has been filled today with previous request.

## 2020-03-16 ENCOUNTER — Ambulatory Visit: Payer: No Typology Code available for payment source | Attending: Internal Medicine

## 2020-03-16 DIAGNOSIS — Z23 Encounter for immunization: Secondary | ICD-10-CM

## 2020-03-16 NOTE — Progress Notes (Signed)
   Covid-19 Vaccination Clinic  Name:  Victoria Cooper    MRN: [id number] DOB: [birth year]  03/16/2020  [name] was observed post Covid-19 immunization for 15 minutes without incident. She was provided with Vaccine Information Sheet and instruction to access the V-Safe system.   [name] was instructed to call 911 with any severe reactions post vaccine: Marland Kitchen Difficulty breathing  . Swelling of face and throat  . A fast heartbeat  . A bad rash all over body  . Dizziness and weakness   Immunizations Administered    No immunizations on file.

## 2020-03-29 ENCOUNTER — Ambulatory Visit (INDEPENDENT_AMBULATORY_CARE_PROVIDER_SITE_OTHER): Payer: No Typology Code available for payment source | Admitting: Internal Medicine

## 2020-05-09 ENCOUNTER — Ambulatory Visit (INDEPENDENT_AMBULATORY_CARE_PROVIDER_SITE_OTHER): Payer: 59 | Admitting: Internal Medicine

## 2020-05-09 ENCOUNTER — Other Ambulatory Visit: Payer: Self-pay

## 2020-05-09 ENCOUNTER — Encounter (INDEPENDENT_AMBULATORY_CARE_PROVIDER_SITE_OTHER): Payer: Self-pay | Admitting: Internal Medicine

## 2020-05-09 VITALS — BP 128/78 | HR 72 | Ht 62.0 in | Wt 163.8 lb

## 2020-05-09 DIAGNOSIS — I10 Essential (primary) hypertension: Secondary | ICD-10-CM

## 2020-05-09 DIAGNOSIS — E2839 Other primary ovarian failure: Secondary | ICD-10-CM

## 2020-05-09 DIAGNOSIS — E559 Vitamin D deficiency, unspecified: Secondary | ICD-10-CM

## 2020-05-09 DIAGNOSIS — E785 Hyperlipidemia, unspecified: Secondary | ICD-10-CM

## 2020-05-09 NOTE — Progress Notes (Signed)
Metrics: Intervention Frequency ACO  Documented Smoking Status Yearly  Screened one or more times in 24 months  Cessation Counseling or  Active cessation medication Past 24 months  Past 24 months   Guideline developer: UpToDate (See UpToDate for funding source) Date Released: 2014       Wellness Office Visit  Subjective:  Patient ID: Victoria Cooper, female    DOB: 08-22-1955  Age: 65 y.o. MRN: [id number]  CC: This lady comes in for follow-up of hypertension, vitamin D deficiency, dyslipidemia and identical hormone therapy. HPI She plans on retiring in the next couple of weeks and is looking forward to becoming healthier this year, losing weight. She has been compliant with taking estradiol, progesterone, thyroid and somewhat consistent with testosterone therapy with weekly injections.  Her last injection was 2 days ago.  Past Medical History:  Diagnosis Date  . Abnormal Pap smear of cervix 2006   Repeat Pap normal  . Allergy   . Animal bite 03/18/2011   Dog Bite  . Arthritis   . Asthma   . Classical migraine with intractable migraine 11/30/2015  . Connective tissue disease, undifferentiated ([place]) 2015   Dr. Amil Amen  . Female hypogonadism syndrome 01/13/2019  . GERD (gastroesophageal reflux disease)   . Hyperlipidemia   . Hypertension   . Migraine   . Vitamin D deficiency disease 01/13/2019  . Wears glasses    Past Surgical History:  Procedure Laterality Date  . ABDOMINAL HYSTERECTOMY  2003   hyst  . BACK SURGERY  2009   lumb lam-fusion  . Basil cell    . BREAST REDUCTION SURGERY  05/26/2012   Procedure: MAMMARY REDUCTION  (BREAST);  Surgeon: Erline Hau, MD;  Location: [facility];  Service: Plastics;  Laterality: Bilateral;  . COLONOSCOPY  2003, 2018   diverticulosis     Family History  Problem Relation Age of Onset  . Heart disease Father   . Lung cancer Mother        never smoker  . ALS Maternal Grandfather   . Melanoma Paternal  Grandmother   . Brain cancer Paternal Grandmother   . Stroke Paternal Grandfather   . Heart failure Maternal Grandmother   . Ulcerative colitis Brother   . Migraines Neg Hx   . 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 Smoking status: Never Smoker  . Smokeless tobacco: Never Used  Substance Use Topics  . Alcohol use: Yes    Alcohol/week: 0.0 - 2.0 standard drinks    Current Meds  Medication Sig  . Cholecalciferol (VITAMIN D-3) 125 MCG (5000 UT) TABS Take 10,000 Units by mouth daily.  Marland Kitchen estradiol (ESTRACE) 0.5 MG tablet Take 1 tablet (0.5 mg total) by mouth daily.  . hydroxychloroquine (PLAQUENIL) 200 MG tablet Take by mouth daily.  Marland Kitchen MELATONIN PO Take by mouth at bedtime.  . NONFORMULARY OR COMPOUNDED ITEM Inject 5 mg into the muscle every 7 (seven) days. Testosterone cypionate in olive  oil (25 mg/mL).  Dispense 5 mL vial.  . NP THYROID 90 MG tablet TAKE 1 TABLET BY MOUTH ONCE A DAY  . progesterone (PROMETRIUM) 200 MG capsule TAKE 1 CAPSULE BY MOUTH EVERY EVENING  . valsartan-hydrochlorothiazide (DIOVAN HCT) 160-12.5 MG per tablet Take  1 tablet by mouth daily.  . [DISCONTINUED] estradiol (ESTRACE) 0.5 MG tablet TAKE 1 TABLET BY MOUTH ONCE A DAY      No flowsheet data found.   Objective:   Today's Vitals: BP 128/78   Pulse 72   Ht 5\' 2"  (1.575 m)   Wt 163 lb 12.8 oz (74.3 kg)   LMP 04/29/2001 (LMP Unknown)   BMI 29.96 kg/m  Vitals with BMI 05/09/2020 11/18/2019 02/15/2019  Height 5\' 2"  5\' 2"  5\' 2"   Weight 163 lbs 13 oz 159 lbs 13 oz 162 lbs  BMI 29.95 23.55 73.22  Systolic 025 427 062  Diastolic 78 80 78  Pulse 72 88 80     Physical Exam   She look systemically well.  She has gained about 4 pounds since the last time she was seen here.  Blood pressure is in a good  range.    Assessment   1. Female hypogonadism syndrome   2. Essential hypertension, benign   3. Vitamin D deficiency disease   4. Dyslipidemia       Tests ordered Orders Placed This Encounter  Procedures  . COMPLETE METABOLIC PANEL WITH GFR  . Estradiol  . Progesterone  . T3, free  . TSH  . Testos,Total,Free and SHBG (Female)  . VITAMIN D 25 Hydroxy (Vit-D Deficiency, Fractures)  . Lipid panel     Plan: 1. She will continue with all by identical hormones and we will check levels today. 2. She will continue with desiccated NP thyroid and I will check levels today. 3. Further recommendations will depend on blood results and I will see her in about 2 months for follow-up.   No orders of the defined types were placed in this encounter.   Doree Albee, MD

## 2020-05-10 ENCOUNTER — Other Ambulatory Visit (INDEPENDENT_AMBULATORY_CARE_PROVIDER_SITE_OTHER): Payer: Self-pay | Admitting: Internal Medicine

## 2020-05-10 MED ORDER — NP THYROID 120 MG PO TABS: 120.0000 mg | ORAL_TABLET | Freq: Every day | ORAL | 3 refills | Status: DC

## 2020-05-13 LAB — COMPLETE METABOLIC PANEL WITH GFR
AG Ratio: 1.7 (calc) (ref 1.0–2.5)
ALT: 23 U/L (ref 6–29)
AST: 18 U/L (ref 10–35)
Albumin: 4.2 g/dL (ref 3.6–5.1)
Alkaline phosphatase (APISO): 40 U/L (ref 37–153)
BUN: 18 mg/dL (ref 7–25)
CO2: 25 mmol/L (ref 20–32)
Calcium: 9.3 mg/dL (ref 8.6–10.4)
Chloride: 104 mmol/L (ref 98–110)
Creat: 0.69 mg/dL (ref 0.50–0.99)
GFR, Est African American: 107 mL/min/{1.73_m2} (ref >=60)
GFR, Est Non African American: 93 mL/min/{1.73_m2} (ref >=60)
Globulin: 2.5 g/dL (calc) (ref 1.9–3.7)
Glucose, Bld: 95 mg/dL (ref 65–139)
Potassium: 3.7 mmol/L (ref 3.5–5.3)
Sodium: 138 mmol/L (ref 135–146)
Total Bilirubin: 0.2 mg/dL (ref 0.2–1.2)
Total Protein: 6.7 g/dL (ref 6.1–8.1)

## 2020-05-13 LAB — TESTOS,TOTAL,FREE AND SHBG (FEMALE)
Free Testosterone: 6.9 pg/mL — ABNORMAL HIGH (ref 0.1–6.4)
Sex Hormone Binding: 61 nmol/L (ref 14–73)
Testosterone, Total, LC-MS-MS: 62 ng/dL — ABNORMAL HIGH (ref 2–45)

## 2020-05-13 LAB — PROGESTERONE: Progesterone: 13.2 ng/mL

## 2020-05-13 LAB — LIPID PANEL
Cholesterol: 204 mg/dL — ABNORMAL HIGH (ref <200)
HDL: 56 mg/dL (ref >=50)
LDL Cholesterol (Calc): 126 mg/dL (calc) — ABNORMAL HIGH
Non-HDL Cholesterol (Calc): 148 mg/dL (calc) — ABNORMAL HIGH (ref <130)
Total CHOL/HDL Ratio: 3.6 (calc) (ref <5.0)
Triglycerides: 112 mg/dL (ref <150)

## 2020-05-13 LAB — VITAMIN D 25 HYDROXY (VIT D DEFICIENCY, FRACTURES): Vit D, 25-Hydroxy: 86 ng/mL (ref 30–100)

## 2020-05-13 LAB — ESTRADIOL: Estradiol: 35 pg/mL

## 2020-05-13 LAB — TSH: TSH: 1.72 mIU/L (ref 0.40–4.50)

## 2020-05-13 LAB — T3, FREE: T3, Free: 3.5 pg/mL (ref 2.3–4.2)

## 2020-05-29 MED FILL — HYDROXYCHLOROQUINE SULFATE: 200 | 30 days supply | Qty: 60 | Fill #0

## 2020-06-08 DIAGNOSIS — Z79899 Other long term (current) drug therapy: Secondary | ICD-10-CM | POA: Diagnosis not present

## 2020-06-08 DIAGNOSIS — H524 Presbyopia: Secondary | ICD-10-CM | POA: Diagnosis not present

## 2020-06-12 ENCOUNTER — Other Ambulatory Visit (INDEPENDENT_AMBULATORY_CARE_PROVIDER_SITE_OTHER): Payer: Self-pay | Admitting: Internal Medicine

## 2020-06-12 MED FILL — NP THYROID 120 MG TABLET: 120 | 30 days supply | Qty: 30 | Fill #0

## 2020-06-12 MED FILL — VALSARTAN-HCTZ 160-12.5 MG: 160-12.5 | 90 days supply | Qty: 90 | Fill #3

## 2020-06-12 MED FILL — PROGESTERONE 200 MG CAPS: 200 | 90 days supply | Qty: 90 | Fill #0

## 2020-06-12 MED FILL — ESTRADIOL 0.5 MG TABS: 0.5 | 90 days supply | Qty: 90 | Fill #1

## 2020-06-29 ENCOUNTER — Other Ambulatory Visit: Payer: Self-pay | Admitting: Obstetrics and Gynecology

## 2020-06-29 DIAGNOSIS — Z1231 Encounter for screening mammogram for malignant neoplasm of breast: Secondary | ICD-10-CM

## 2020-07-10 ENCOUNTER — Ambulatory Visit (INDEPENDENT_AMBULATORY_CARE_PROVIDER_SITE_OTHER): Payer: 59 | Admitting: Internal Medicine

## 2020-07-14 ENCOUNTER — Other Ambulatory Visit: Payer: Self-pay

## 2020-07-14 ENCOUNTER — Encounter: Payer: Self-pay | Admitting: Obstetrics and Gynecology

## 2020-07-14 ENCOUNTER — Ambulatory Visit (INDEPENDENT_AMBULATORY_CARE_PROVIDER_SITE_OTHER): Payer: 59 | Admitting: Obstetrics and Gynecology

## 2020-07-14 VITALS — BP 118/72 | HR 100 | Ht 61.0 in | Wt 162.0 lb

## 2020-07-14 DIAGNOSIS — Z01419 Encounter for gynecological examination (general) (routine) without abnormal findings: Secondary | ICD-10-CM | POA: Diagnosis not present

## 2020-07-14 DIAGNOSIS — N8110 Cystocele, unspecified: Secondary | ICD-10-CM

## 2020-07-14 NOTE — Progress Notes (Signed)
65 y.o. G45P3003 Married Caucasian female here for annual exam.    Dr. Kathee Delton prescribing her HRT.  Fitted for a pessary in 2019. She did not use it due to pain.   Has done pelvic floor therapy. It helped when she did it.  Has a challenge with urgency.  Takes HCTZ.  Not using a pad.  No problems with bowel function.   Taking calcium/Mg/zinc at hs.  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   PCP:  Deland Pretty, MD   Patient's last menstrual period was 04/29/2001 (lmp unknown).           Sexually active: Yes.    The current method of family planning is status post hysterectomy.    Exercising: Yes.    4-5 times per week Smoker:  no  Health Maintenance: Pap:  2012 Neg History of abnormal Pap:  Yes, repeat pap normal MMG:  06/24/16 BIRADS 1 negative/density b -- scheduled 08/21/20 Colonoscopy:  2018 Normal BMD:   2012  Result  normal TDaP:  2012 Gardasil:   n/a HIV and Hep C: 07/18/16 Neg Screening Labs: PCP   reports that she has never smoked. She has never used smokeless tobacco. She reports current alcohol use. She reports that she does not use drugs.  Past Medical History:  Diagnosis Date  . Abnormal Pap smear of cervix 2006   Repeat Pap normal  . Allergy   . Animal bite 03/18/2011   Dog Bite  . Arthritis   . Asthma   . Classical migraine with intractable migraine 11/30/2015  . Connective tissue disease, undifferentiated ([place]) 2015   Dr. Amil Amen  . Female hypogonadism syndrome 01/13/2019  . GERD (gastroesophageal reflux disease)   . Hyperlipidemia   . Hypertension   . Migraine   . Vitamin D deficiency disease 01/13/2019  . Wears glasses     Past Surgical History:  Procedure Laterality Date  . ABDOMINAL HYSTERECTOMY  2003   hyst  . BACK SURGERY  2009   lumb lam-fusion  . Basil cell    . BREAST REDUCTION SURGERY  05/26/2012   Procedure: MAMMARY REDUCTION  (BREAST);  Surgeon: Erline Hau, MD;  Location: [facility];  Service:  Plastics;  Laterality: Bilateral;  . COLONOSCOPY  2003, 2018   diverticulosis    Current Outpatient Medications  Medication Sig Dispense Refill  . Cholecalciferol (VITAMIN D-3) 125 MCG (5000 UT) TABS Take 10,000 Units by mouth daily.    Marland Kitchen estradiol (ESTRACE) 0.5 MG tablet Take 1 tablet (0.5 mg total) by mouth daily. 90 tablet 1  . hydroxychloroquine (PLAQUENIL) 200 MG tablet Take by mouth daily.    Marland Kitchen MELATONIN PO Take by mouth at bedtime.    . progesterone (PROMETRIUM) 200 MG capsule TAKE 1 CAPSULE BY MOUTH EVERY EVENING 90 capsule 0  . valsartan-hydrochlorothiazide (DIOVAN HCT) 160-12.5 MG per tablet Take 1 tablet by mouth daily. 30 tablet 11  . NONFORMULARY OR COMPOUNDED ITEM Inject 5 mg into the muscle every 7 (seven) days. Testosterone cypionate in olive  oil (25 mg/mL).  Dispense 5 mL vial. (Patient not taking: Reported on 07/14/2020) 1 each 3  . NP THYROID 120 MG tablet Take 1 tablet (120 mg total) by mouth daily before breakfast. (Patient not taking: Reported on 07/14/2020) 30 tablet 3  . rizatriptan (MAXALT) 10 MG tablet 1 tablet, may take extra tab 2 hours later if not extinguished (Patient not taking: Reported on 07/14/2020)     No current facility-administered medications  for this visit.    Family History  Problem Relation Age of Onset  . Heart disease Father   . Lung cancer Mother        never smoker  . ALS Maternal Grandfather   . Melanoma Paternal Grandmother   . Brain cancer Paternal Grandmother   . Stroke Paternal Grandfather   . Heart failure Maternal Grandmother   . Ulcerative colitis Brother   . Migraines Neg Hx   . Colon cancer Neg Hx     Review of Systems  Constitutional: Negative.   HENT: Negative.   Eyes: Negative.   Respiratory: Negative.   Cardiovascular: Negative.   Gastrointestinal: Negative.   Endocrine: Negative.   Genitourinary: Negative.   Musculoskeletal: Negative.   Skin: Negative.   Allergic/Immunologic: Negative.   Neurological: Negative.    Hematological: Negative.   Psychiatric/Behavioral: Negative.     Exam:   BP 118/72 (BP Location: Right Arm, Patient Position: Sitting, Cuff Size: Normal)   Pulse 100   Ht 5\' 1"  (1.549 m)   Wt 162 lb (73.5 kg)   LMP 04/29/2001 (LMP Unknown)   SpO2 98%   BMI 30.61 kg/m     General appearance: alert, cooperative and appears stated age Head: normocephalic, without obvious abnormality, atraumatic Neck: no adenopathy, supple, symmetrical, trachea midline and thyroid normal to inspection and palpation Lungs: clear to auscultation bilaterally Breasts: consistent with reduction, no masses or tenderness, No nipple retraction or dimpling, No nipple discharge or bleeding, No axillary adenopathy Heart: regular rate and rhythm Abdomen: soft, non-tender; no masses, no organomegaly Extremities: extremities normal, atraumatic, no cyanosis or edema Skin: skin color, texture, turgor normal. No rashes or lesions Lymph nodes: cervical, supraclavicular, and axillary nodes normal. Neurologic: grossly normal  Pelvic: External genitalia:  no lesions              No abnormal inguinal nodes palpated.              Urethra:  normal appearing urethra with no masses, tenderness or lesions              Bartholins and Skenes: normal                 Vagina: normal appearing vagina with normal color and discharge, no lesions                      Second degree cystocele, first degree vault prolapse, first degree rectocele              Cervix: absent              Pap taken: No. Bimanual Exam:  Uterus:  absent              Adnexa: no mass, fullness, tenderness              Rectal exam: Yes.  .  Confirms.              Anus:  normal sphincter tone, no lesions  Chaperone was present for exam.  Assessment:   Well woman visit with normal exam. Status post TAH. Ovaries remain.  HRT, treatment initiated years after menopause.  Second degree cystocele, first degree vault prolapse, first degree rectocele.   Stable. Vaginal atrophy.  Bilateral breast reduction.  Connective tissue disorder. On Plaquenil   Plan: Mammogram screening discussed. Self breast awareness reviewed. Pap and HR HPV as above. Guidelines for Calcium, Vitamin D, regular exercise program including cardiovascular and weight bearing  exercise. Discused WHI and use of HRT which can increase risk of PE, DVT, MI, stroke and breast cancer.  She will discuss her hormone treatment goals with Dr. Kathee Delton.  Prolapse treatment options reviewed:  Observation, pessary with vaginal estrogen use, and surgical care.  She will let me know if she wishes to pursue a treatment.  Follow up annually and prn.   After visit summary provided.

## 2020-07-14 NOTE — Patient Instructions (Signed)

## 2020-07-19 MED FILL — NP THYROID 120 MG TABLET: 120 | 30 days supply | Qty: 30 | Fill #1

## 2020-07-20 ENCOUNTER — Other Ambulatory Visit (HOSPITAL_BASED_OUTPATIENT_CLINIC_OR_DEPARTMENT_OTHER): Payer: Self-pay

## 2020-07-20 MED FILL — HYDROXYCHLOROQUINE SULFATE: 200 | 30 days supply | Qty: 60 | Fill #1

## 2020-08-21 ENCOUNTER — Other Ambulatory Visit: Payer: Self-pay | Admitting: Obstetrics and Gynecology

## 2020-08-21 ENCOUNTER — Ambulatory Visit: Payer: 59

## 2020-08-21 ENCOUNTER — Other Ambulatory Visit (HOSPITAL_COMMUNITY): Payer: Self-pay

## 2020-08-21 DIAGNOSIS — Z1231 Encounter for screening mammogram for malignant neoplasm of breast: Secondary | ICD-10-CM

## 2020-08-21 MED ORDER — PREDNISONE 5 MG PO TABS
ORAL_TABLET | ORAL | 0 refills | Status: AC
  Filled 2020-08-21: qty 21, 6d supply, fill #0

## 2020-08-22 ENCOUNTER — Encounter (INDEPENDENT_AMBULATORY_CARE_PROVIDER_SITE_OTHER): Payer: Self-pay | Admitting: Internal Medicine

## 2020-08-22 ENCOUNTER — Other Ambulatory Visit: Payer: Self-pay

## 2020-08-22 ENCOUNTER — Ambulatory Visit (INDEPENDENT_AMBULATORY_CARE_PROVIDER_SITE_OTHER): Payer: 59 | Admitting: Internal Medicine

## 2020-08-22 VITALS — BP 120/68 | HR 68 | Resp 18 | Ht 61.0 in | Wt 164.0 lb

## 2020-08-22 DIAGNOSIS — E2839 Other primary ovarian failure: Secondary | ICD-10-CM

## 2020-08-22 DIAGNOSIS — E785 Hyperlipidemia, unspecified: Secondary | ICD-10-CM | POA: Diagnosis not present

## 2020-08-22 MED ORDER — NONFORMULARY OR COMPOUNDED ITEM: 5.0000 mg | 3 refills | Status: DC

## 2020-08-22 NOTE — Progress Notes (Signed)
Metrics: Intervention Frequency ACO  Documented Smoking Status Yearly  Screened one or more times in 24 months  Cessation Counseling or  Active cessation medication Past 24 months  Past 24 months   Guideline developer: UpToDate (See UpToDate for funding source) Date Released: 2014       Wellness Office Visit  Subjective:  Patient ID: Victoria Cooper, female    DOB: [birth year]  Age: 65 y.o. MRN: [id number]  CC: This lady comes in for follow-up of identical hormone therapy. HPI  She also has hypertension and is on medication for this followed by her primary care physician. She tolerates low-dose estradiol, progesterone and testosterone which she has not been taking for some time now but she would like to get back onto taking it. She also continues on NP thyroid, off label, for symptoms of thyroid hypofunction. Past Medical History:  Diagnosis Date  . Abnormal Pap smear of cervix 2006   Repeat Pap normal  . Allergy   . Animal bite 03/18/2011   Dog Bite  . Arthritis   . Asthma   . Classical migraine with intractable migraine 11/30/2015  . Connective tissue disease, undifferentiated ([place]) 2015   Dr. Amil Amen  . Female hypogonadism syndrome 01/13/2019  . GERD (gastroesophageal reflux disease)   . Hyperlipidemia   . Hypertension   . Migraine   . Vitamin D deficiency disease 01/13/2019  . Wears glasses    Past Surgical History:  Procedure Laterality Date  . ABDOMINAL HYSTERECTOMY  2003   hyst  . BACK SURGERY  2009   lumb lam-fusion  . Basil cell    . BREAST REDUCTION SURGERY  05/26/2012   Procedure: MAMMARY REDUCTION  (BREAST);  Surgeon: Erline Hau, MD;  Location: [facility];  Service: Plastics;  Laterality: Bilateral;  . COLONOSCOPY  2003, 2018   diverticulosis     Family History  Problem Relation Age of Onset  . Heart disease Father   . Lung cancer Mother        never smoker  . ALS Maternal Grandfather   . Melanoma Paternal Grandmother   .  Brain cancer Paternal Grandmother   . Stroke Paternal Grandfather   . Heart failure Maternal Grandmother   . Ulcerative colitis Brother   . Migraines Neg Hx   . Colon cancer Neg Hx     Social History   Social History Narrative   Lives at home w/ her husband   Retired Clinical biochemist.   Right-handed   Caffeine: 1-3 cups of coffee per day   Social History   Tobacco Use  . Smoking status: Never Smoker  . Smokeless tobacco: Never Used  Substance Use Topics  . Alcohol use: Yes    Alcohol/week: 0.0 - 2.0 standard drinks    Current Meds  Medication Sig  . Cholecalciferol (VITAMIN D-3) 125 MCG (5000 UT) TABS Take 10,000 Units by mouth daily.  Marland Kitchen estradiol (ESTRACE) 0.5 MG tablet TAKE 1 TABLET BY MOUTH ONCE A DAY  . hydroxychloroquine (PLAQUENIL) 200 MG tablet Take by mouth daily.  . hydroxychloroquine (PLAQUENIL) 200 MG tablet TAKE 1 TABLET BY MOUTH TWO TIMES DAILY WITH FOOD OR MILK  . MELATONIN PO Take by mouth at bedtime.  . NP THYROID 120 MG tablet TAKE 1 TABLET BY MOUTH DAILY BEFORE BREAKFAST.  Marland Kitchen predniSONE (DELTASONE) 5 MG tablet Take 6 tablets by mouth on day 1, 5 tablets on day 2, 4 tablets on day 3, 3 tablets on day 4, 2 tablets on day 5  and 1 tablet on day 6 then stop as directed  . progesterone (PROMETRIUM) 200 MG capsule TAKE 1 CAPSULE BY MOUTH EVERY EVENING  . rizatriptan (MAXALT) 10 MG tablet   . valsartan-hydrochlorothiazide (DIOVAN HCT) 160-12.5 MG per tablet Take 1 tablet by mouth daily.  . valsartan-hydrochlorothiazide (DIOVAN-HCT) 160-12.5 MG tablet TAKE 1 TABLET BY MOUTH ONCE DAILY  . [DISCONTINUED] NONFORMULARY OR COMPOUNDED ITEM Inject 5 mg into the muscle every 7 (seven) days. Testosterone cypionate in olive  oil (25 mg/mL).  Dispense 5 mL vial. (Patient taking differently: Inject 5 mg into the muscle every 7 (seven) days. Testosterone cypionate in olive  oil (25 mg/mL).  Dispense 5 mL vial.)       Objective:   Today's Vitals: BP 120/68 (BP Location: Left Arm,  Patient Position: Sitting, Cuff Size: Normal)   Pulse 68   Resp 18   Ht 5\' 1"  (1.549 m)   Wt 164 lb (74.4 kg)   LMP 04/29/2001 (LMP Unknown)   SpO2 99%   BMI 30.99 kg/m  Vitals with BMI 08/22/2020 07/14/2020 05/09/2020  Height 5\' 1"  5\' 1"  5\' 2"   Weight 164 lbs 162 lbs 163 lbs 13 oz  BMI 31 73.22 02.54  Systolic 270 623 762  Diastolic 68 72 78  Pulse 68 100 72     Physical Exam  She looks systemically well, remains obese.  Blood pressure is in a good range.     Assessment   1. Female hypogonadism syndrome   2. Dyslipidemia       Tests ordered No orders of the defined types were placed in this encounter.    Plan: 1. She will continue with estradiol, progesterone and I have called in a prescription of testosterone injectable 5 mg intramuscularly once a week to [facility] in [place]. 2. Follow-up in 6 months.  Continue to work on nutrition and exercise as before.   Meds ordered this encounter  Medications  . NONFORMULARY OR COMPOUNDED ITEM    Sig: Inject 5 mg into the muscle every 7 (seven) days. Testosterone cypionate in olive  oil (25 mg/mL).  Dispense 5 mL vial.    Dispense:  1 each    Refill:  3    Stephonie Wilcoxen Luther Parody, MD

## 2020-08-31 ENCOUNTER — Other Ambulatory Visit (HOSPITAL_COMMUNITY): Payer: Self-pay

## 2020-08-31 ENCOUNTER — Other Ambulatory Visit (HOSPITAL_BASED_OUTPATIENT_CLINIC_OR_DEPARTMENT_OTHER): Payer: Self-pay

## 2020-08-31 ENCOUNTER — Other Ambulatory Visit (INDEPENDENT_AMBULATORY_CARE_PROVIDER_SITE_OTHER): Payer: Self-pay | Admitting: Internal Medicine

## 2020-08-31 MED ORDER — PROGESTERONE 200 MG PO CAPS
200.0000 mg | ORAL_CAPSULE | Freq: Every evening | ORAL | 1 refills | Status: DC
  Filled 2020-08-31: qty 90, 90d supply, fill #0
  Filled 2020-12-18: qty 90, 90d supply, fill #1

## 2020-08-31 MED FILL — Thyroid Tab 120 MG (2 Grain): ORAL | 30 days supply | Qty: 30 | Fill #0 | Status: AC

## 2020-09-05 ENCOUNTER — Other Ambulatory Visit (HOSPITAL_COMMUNITY): Payer: Self-pay

## 2020-09-11 DIAGNOSIS — Z0000 Encounter for general adult medical examination without abnormal findings: Secondary | ICD-10-CM | POA: Diagnosis not present

## 2020-09-11 DIAGNOSIS — J387 Other diseases of larynx: Secondary | ICD-10-CM | POA: Diagnosis not present

## 2020-09-11 DIAGNOSIS — G43909 Migraine, unspecified, not intractable, without status migrainosus: Secondary | ICD-10-CM | POA: Diagnosis not present

## 2020-09-11 DIAGNOSIS — Z23 Encounter for immunization: Secondary | ICD-10-CM | POA: Diagnosis not present

## 2020-09-11 DIAGNOSIS — M359 Systemic involvement of connective tissue, unspecified: Secondary | ICD-10-CM | POA: Diagnosis not present

## 2020-09-11 DIAGNOSIS — I340 Nonrheumatic mitral (valve) insufficiency: Secondary | ICD-10-CM | POA: Diagnosis not present

## 2020-09-11 DIAGNOSIS — I10 Essential (primary) hypertension: Secondary | ICD-10-CM | POA: Diagnosis not present

## 2020-09-12 ENCOUNTER — Other Ambulatory Visit: Payer: Self-pay | Admitting: Internal Medicine

## 2020-09-12 DIAGNOSIS — I10 Essential (primary) hypertension: Secondary | ICD-10-CM

## 2020-09-18 ENCOUNTER — Other Ambulatory Visit (INDEPENDENT_AMBULATORY_CARE_PROVIDER_SITE_OTHER): Payer: Self-pay | Admitting: Internal Medicine

## 2020-09-18 ENCOUNTER — Other Ambulatory Visit (HOSPITAL_COMMUNITY): Payer: Self-pay | Admitting: Internal Medicine

## 2020-09-18 ENCOUNTER — Other Ambulatory Visit (INDEPENDENT_AMBULATORY_CARE_PROVIDER_SITE_OTHER): Payer: Self-pay

## 2020-09-18 ENCOUNTER — Other Ambulatory Visit (HOSPITAL_COMMUNITY): Payer: Self-pay

## 2020-09-18 MED ORDER — ESTRADIOL 0.5 MG PO TABS
ORAL_TABLET | Freq: Every day | ORAL | 1 refills | Status: DC
  Filled 2020-09-18: qty 90, 90d supply, fill #0
  Filled 2020-12-18: qty 90, 90d supply, fill #1

## 2020-09-18 MED ORDER — HYDROXYCHLOROQUINE SULFATE 200 MG PO TABS
200.0000 mg | ORAL_TABLET | Freq: Two times a day (BID) | ORAL | 1 refills | Status: DC
  Filled 2020-09-18: qty 60, 30d supply, fill #0
  Filled 2020-11-06: qty 60, 30d supply, fill #1

## 2020-09-19 ENCOUNTER — Other Ambulatory Visit (HOSPITAL_COMMUNITY): Payer: Self-pay

## 2020-09-19 MED ORDER — VALSARTAN-HYDROCHLOROTHIAZIDE 160-12.5 MG PO TABS
1.0000 | ORAL_TABLET | Freq: Every day | ORAL | 3 refills | Status: DC
  Filled 2020-09-19: qty 90, 90d supply, fill #0
  Filled 2020-12-18: qty 90, 90d supply, fill #1
  Filled 2021-03-20 – 2021-03-28 (×2): qty 90, 90d supply, fill #2
  Filled 2021-07-14: qty 90, 90d supply, fill #3

## 2020-09-20 ENCOUNTER — Other Ambulatory Visit (HOSPITAL_COMMUNITY): Payer: Self-pay

## 2020-10-02 DIAGNOSIS — D225 Melanocytic nevi of trunk: Secondary | ICD-10-CM | POA: Diagnosis not present

## 2020-10-02 DIAGNOSIS — D485 Neoplasm of uncertain behavior of skin: Secondary | ICD-10-CM | POA: Diagnosis not present

## 2020-10-02 DIAGNOSIS — Z1283 Encounter for screening for malignant neoplasm of skin: Secondary | ICD-10-CM | POA: Diagnosis not present

## 2020-10-06 DIAGNOSIS — Z1231 Encounter for screening mammogram for malignant neoplasm of breast: Secondary | ICD-10-CM

## 2020-10-09 ENCOUNTER — Ambulatory Visit
Admission: RE | Admit: 2020-10-09 | Discharge: 2020-10-09 | Disposition: A | Discharge status: Home / Self Care | Payer: No Typology Code available for payment source | Source: Ambulatory Visit | Attending: Internal Medicine | Admitting: Internal Medicine

## 2020-10-09 DIAGNOSIS — M359 Systemic involvement of connective tissue, unspecified: Secondary | ICD-10-CM | POA: Diagnosis not present

## 2020-10-09 DIAGNOSIS — I10 Essential (primary) hypertension: Secondary | ICD-10-CM | POA: Diagnosis not present

## 2020-10-09 DIAGNOSIS — E789 Disorder of lipoprotein metabolism, unspecified: Secondary | ICD-10-CM | POA: Diagnosis not present

## 2020-10-12 ENCOUNTER — Other Ambulatory Visit (HOSPITAL_COMMUNITY): Payer: Self-pay

## 2020-10-12 MED FILL — Thyroid Tab 120 MG (2 Grain): ORAL | 30 days supply | Qty: 30 | Fill #1 | Status: AC

## 2020-10-16 DIAGNOSIS — M79641 Pain in right hand: Secondary | ICD-10-CM | POA: Diagnosis not present

## 2020-10-16 DIAGNOSIS — Z79899 Other long term (current) drug therapy: Secondary | ICD-10-CM | POA: Diagnosis not present

## 2020-10-16 DIAGNOSIS — E785 Hyperlipidemia, unspecified: Secondary | ICD-10-CM | POA: Diagnosis not present

## 2020-10-16 DIAGNOSIS — M1811 Unilateral primary osteoarthritis of first carpometacarpal joint, right hand: Secondary | ICD-10-CM | POA: Diagnosis not present

## 2020-10-16 DIAGNOSIS — M1990 Unspecified osteoarthritis, unspecified site: Secondary | ICD-10-CM | POA: Diagnosis not present

## 2020-10-16 DIAGNOSIS — R768 Other specified abnormal immunological findings in serum: Secondary | ICD-10-CM | POA: Diagnosis not present

## 2020-10-16 DIAGNOSIS — M3501 Sicca syndrome with keratoconjunctivitis: Secondary | ICD-10-CM | POA: Diagnosis not present

## 2020-10-16 DIAGNOSIS — M359 Systemic involvement of connective tissue, unspecified: Secondary | ICD-10-CM | POA: Diagnosis not present

## 2020-10-16 DIAGNOSIS — M79642 Pain in left hand: Secondary | ICD-10-CM | POA: Diagnosis not present

## 2020-10-16 DIAGNOSIS — M1812 Unilateral primary osteoarthritis of first carpometacarpal joint, left hand: Secondary | ICD-10-CM | POA: Diagnosis not present

## 2020-10-16 DIAGNOSIS — I10 Essential (primary) hypertension: Secondary | ICD-10-CM | POA: Diagnosis not present

## 2020-10-18 ENCOUNTER — Ambulatory Visit
Admission: RE | Admit: 2020-10-18 | Discharge: 2020-10-18 | Disposition: A | Discharge status: Home / Self Care | Payer: 59 | Source: Ambulatory Visit | Attending: Obstetrics and Gynecology | Admitting: Obstetrics and Gynecology

## 2020-10-18 ENCOUNTER — Other Ambulatory Visit: Payer: Self-pay

## 2020-10-18 DIAGNOSIS — Z1231 Encounter for screening mammogram for malignant neoplasm of breast: Secondary | ICD-10-CM

## 2020-10-23 ENCOUNTER — Other Ambulatory Visit (HOSPITAL_COMMUNITY): Payer: Self-pay

## 2020-10-23 DIAGNOSIS — I2510 Atherosclerotic heart disease of native coronary artery without angina pectoris: Secondary | ICD-10-CM | POA: Diagnosis not present

## 2020-10-23 DIAGNOSIS — F5104 Psychophysiologic insomnia: Secondary | ICD-10-CM | POA: Diagnosis not present

## 2020-10-23 DIAGNOSIS — I700 Atherosclerosis of aorta: Secondary | ICD-10-CM | POA: Diagnosis not present

## 2020-10-23 MED ORDER — TRAZODONE HCL 50 MG PO TABS
50.0000 mg | ORAL_TABLET | Freq: Every evening | ORAL | 0 refills | Status: DC | PRN
  Filled 2020-10-23: qty 135, 90d supply, fill #0

## 2020-10-23 MED ORDER — ROSUVASTATIN CALCIUM 20 MG PO TABS
ORAL_TABLET | ORAL | 3 refills | Status: DC
  Filled 2020-10-23: qty 90, 90d supply, fill #0
  Filled 2021-01-23: qty 90, 90d supply, fill #1
  Filled 2021-01-24: qty 90, 90d supply, fill #0
  Filled 2021-04-24: qty 90, 90d supply, fill #1
  Filled 2021-08-18: qty 90, 90d supply, fill #2

## 2020-11-06 ENCOUNTER — Other Ambulatory Visit (INDEPENDENT_AMBULATORY_CARE_PROVIDER_SITE_OTHER): Payer: Self-pay | Admitting: Internal Medicine

## 2020-11-06 ENCOUNTER — Other Ambulatory Visit (HOSPITAL_COMMUNITY): Payer: Self-pay

## 2020-11-06 MED ORDER — NP THYROID 120 MG PO TABS
120.0000 mg | ORAL_TABLET | Freq: Every day | ORAL | 3 refills | Status: DC
  Filled 2020-11-06: qty 30, 30d supply, fill #0
  Filled 2020-12-18: qty 30, 30d supply, fill #1
  Filled 2021-01-23: qty 30, 30d supply, fill #2
  Filled 2021-03-05: qty 30, 30d supply, fill #3

## 2020-11-28 DIAGNOSIS — E785 Hyperlipidemia, unspecified: Secondary | ICD-10-CM | POA: Diagnosis not present

## 2020-12-04 ENCOUNTER — Other Ambulatory Visit (HOSPITAL_COMMUNITY): Payer: Self-pay

## 2020-12-04 DIAGNOSIS — F5104 Psychophysiologic insomnia: Secondary | ICD-10-CM | POA: Diagnosis not present

## 2020-12-04 MED ORDER — TRAZODONE HCL 150 MG PO TABS
ORAL_TABLET | ORAL | 3 refills | Status: DC
  Filled 2020-12-04: qty 90, 90d supply, fill #0
  Filled 2021-03-05: qty 90, 90d supply, fill #1
  Filled 2021-06-08: qty 90, 90d supply, fill #2
  Filled 2021-09-07: qty 90, 90d supply, fill #3

## 2020-12-18 ENCOUNTER — Other Ambulatory Visit (HOSPITAL_COMMUNITY): Payer: Self-pay

## 2020-12-19 ENCOUNTER — Other Ambulatory Visit (HOSPITAL_COMMUNITY): Payer: Self-pay

## 2020-12-19 MED ORDER — HYDROXYCHLOROQUINE SULFATE 200 MG PO TABS
ORAL_TABLET | ORAL | 1 refills | Status: DC
  Filled 2020-12-19: qty 60, 30d supply, fill #0
  Filled 2021-03-20 – 2021-03-28 (×2): qty 60, 30d supply, fill #1

## 2021-01-23 ENCOUNTER — Other Ambulatory Visit (HOSPITAL_COMMUNITY): Payer: Self-pay

## 2021-01-24 ENCOUNTER — Other Ambulatory Visit (HOSPITAL_COMMUNITY): Payer: Self-pay

## 2021-01-30 DIAGNOSIS — M25561 Pain in right knee: Secondary | ICD-10-CM | POA: Diagnosis not present

## 2021-02-12 DIAGNOSIS — M5451 Vertebrogenic low back pain: Secondary | ICD-10-CM | POA: Diagnosis not present

## 2021-02-21 ENCOUNTER — Ambulatory Visit (INDEPENDENT_AMBULATORY_CARE_PROVIDER_SITE_OTHER): Payer: 59 | Admitting: Internal Medicine

## 2021-03-05 ENCOUNTER — Other Ambulatory Visit (INDEPENDENT_AMBULATORY_CARE_PROVIDER_SITE_OTHER): Payer: Self-pay

## 2021-03-05 ENCOUNTER — Other Ambulatory Visit (HOSPITAL_COMMUNITY): Payer: Self-pay

## 2021-03-05 DIAGNOSIS — D225 Melanocytic nevi of trunk: Secondary | ICD-10-CM | POA: Diagnosis not present

## 2021-03-05 DIAGNOSIS — Z1283 Encounter for screening for malignant neoplasm of skin: Secondary | ICD-10-CM | POA: Diagnosis not present

## 2021-03-05 MED ORDER — NP THYROID 120 MG PO TABS
120.0000 mg | ORAL_TABLET | Freq: Every day | ORAL | 1 refills | Status: DC
  Filled 2021-03-05: qty 90, 90d supply, fill #0

## 2021-03-12 ENCOUNTER — Ambulatory Visit: Payer: 59 | Admitting: Cardiology

## 2021-03-13 NOTE — Progress Notes (Deleted)
Cardiology Office Note  Date: 03/13/2021   ID: Nyella, Eckels 23-Jul-1955, MRN [id number]  PCP:  Deland Pretty, MD  Cardiologist:  Rozann Lesches, MD Electrophysiologist:  None   No chief complaint on file.   History of Present Illness: Victoria Cooper is a 65 y.o. female last seen in October 2020.  Echocardiogram in October 2020 revealed LVEF 55 to 60% with mild diastolic dysfunction.  Moderate mitral regurgitation was present at that time.  Past Medical History:  Diagnosis Date   Abnormal Pap smear of cervix 2006   Repeat Pap normal   Allergy    Animal bite 03/18/2011   Dog Bite   Arthritis    Asthma    Classical migraine with intractable migraine 11/30/2015   Connective tissue disease, undifferentiated ([place]) 2015   Dr. Amil Amen   Female hypogonadism syndrome 01/13/2019   GERD (gastroesophageal reflux disease)    Hyperlipidemia    Hypertension    Migraine    Vitamin D deficiency disease 01/13/2019   Wears glasses     Past Surgical History:  Procedure Laterality Date   ABDOMINAL HYSTERECTOMY  2003   hyst   BACK SURGERY  2009   lumb lam-fusion   Basil cell     BREAST REDUCTION SURGERY  05/26/2012   Procedure: MAMMARY REDUCTION  (BREAST);  Surgeon: Erline Hau, MD;  Location: [facility];  Service: Plastics;  Laterality: Bilateral;   COLONOSCOPY  2003, 2018   diverticulosis    Current Outpatient Medications  Medication Sig Dispense Refill   Cholecalciferol (VITAMIN D-3) 125 MCG (5000 UT) TABS Take 10,000 Units by mouth daily.     estradiol (ESTRACE) 0.5 MG tablet TAKE 1 TABLET BY MOUTH ONCE A DAY 90 tablet 1   hydroxychloroquine (PLAQUENIL) 200 MG tablet Take by mouth daily.     hydroxychloroquine (PLAQUENIL) 200 MG tablet Take 1 tablet by mouth 2 times daily with food or milk. 60 tablet 1   MELATONIN PO Take by mouth at bedtime.     NONFORMULARY OR COMPOUNDED ITEM Inject 5 mg into the muscle every 7 (seven) days. Testosterone  cypionate in olive  oil (25 mg/mL).  Dispense 5 mL vial. 1 each 3   NP THYROID 120 MG tablet Take 1 tablet (120 mg total) by mouth daily before breakfast. 30 tablet 3   progesterone (PROMETRIUM) 200 MG capsule Take 1 capsule (200 mg total) by mouth every evening. 90 capsule 1   rizatriptan (MAXALT) 10 MG tablet      rosuvastatin (CRESTOR) 20 MG tablet Take 1 tablet by mouth daily 90 tablet 3   thyroid (NP THYROID) 120 MG tablet Take 1 tablet (120 mg total) by mouth daily on an empty stomach. 90 tablet 1   traZODone (DESYREL) 150 MG tablet Take 1 tablet by mouth once daily at bedtime 90 tablet 3   traZODone (DESYREL) 50 MG tablet Take 1-1.5 tablets (50-75 mg total) by mouth at bedtime as needed. 135 tablet 0   valsartan-hydrochlorothiazide (DIOVAN HCT) 160-12.5 MG per tablet Take 1 tablet by mouth daily. 30 tablet 11   valsartan-hydrochlorothiazide (DIOVAN-HCT) 160-12.5 MG tablet TAKE 1 TABLET BY MOUTH ONCE DAILY 90 tablet 3   valsartan-hydrochlorothiazide (DIOVAN-HCT) 160-12.5 MG tablet TAKE 1 TABLET BY MOUTH ONCE DAILY 90 tablet 3   No current facility-administered medications for this visit.   Allergies:  Morphine and related and Sulfa antibiotics   Social History: The patient  reports that she has never smoked. She has never  used smokeless tobacco. She reports current alcohol use. She reports that she does not use drugs.   Family History: The patient's family history includes ALS in her maternal grandfather; Brain cancer in her paternal grandmother; Heart disease in her father; Heart failure in her maternal grandmother; Lung cancer in her mother; Melanoma in her paternal grandmother; Stroke in her paternal grandfather; Ulcerative colitis in her brother.   ROS:  Please see the history of present illness. Otherwise, complete review of systems is positive for {NONE DEFAULTED:18576}.  All other systems are reviewed and negative.   Physical Exam: VS:  LMP 04/29/2001 (LMP Unknown) , BMI There is  no height or weight on file to calculate BMI.  Wt Readings from Last 3 Encounters:  08/22/20 164 lb (74.4 kg)  07/14/20 162 lb (73.5 kg)  05/09/20 163 lb 12.8 oz (74.3 kg)    General: Patient appears comfortable at rest. HEENT: Conjunctiva and lids normal, oropharynx clear with moist mucosa. Neck: Supple, no elevated JVP or carotid bruits, no thyromegaly. Lungs: Clear to auscultation, nonlabored breathing at rest. Cardiac: Regular rate and rhythm, no S3 or significant systolic murmur, no pericardial rub. Abdomen: Soft, nontender, no hepatomegaly, bowel sounds present, no guarding or rebound. Extremities: No pitting edema, distal pulses 2+. Skin: Warm and dry. Musculoskeletal: No kyphosis. Neuropsychiatric: Alert and oriented x3, affect grossly appropriate.  ECG:  An ECG dated 07/03/2018 was personally reviewed today and demonstrated:  Sinus rhythm with PAC.  Recent Labwork: 05/09/2020: ALT 23; AST 18; BUN 18; Creat 0.69; Potassium 3.7; Sodium 138; TSH 1.72     Component Value Date/Time   CHOL 204 (H) 05/09/2020 1747   TRIG 112 05/09/2020 1747   HDL 56 05/09/2020 1747   CHOLHDL 3.6 05/09/2020 1747   LDLCALC 126 (H) 05/09/2020 1747    Other Studies Reviewed Today:  Echocardiogram 02/26/2019:  1. Left ventricular ejection fraction, by visual estimation, is 55 to  60%. The left ventricle has normal function. There is no left ventricular  hypertrophy.   2. Left ventricular diastolic parameters are consistent with Grade I  diastolic dysfunction (impaired relaxation).   3. Global right ventricle has normal systolic function.The right  ventricular size is normal. No increase in right ventricular wall  thickness.   4. Left atrial size was normal.   5. Right atrial size was normal.   6. The mitral valve is grossly normal. Moderate mitral valve  regurgitation.   7. The tricuspid valve is grossly normal. Tricuspid valve regurgitation  is trivial.   8. The aortic valve is tricuspid.  Aortic valve regurgitation is not  visualized. No evidence of aortic valve sclerosis or stenosis.   9. The pulmonic valve was grossly normal. Pulmonic valve regurgitation is  not visualized.  10. Normal pulmonary artery systolic pressure.  11. The inferior vena cava is normal in size with greater than 50%  respiratory variability, suggesting right atrial pressure of 3 mmHg.   Assessment and Plan:   Medication Adjustments/Labs and Tests Ordered: Current medicines are reviewed at length with the patient today.  Concerns regarding medicines are outlined above.   Tests Ordered: No orders of the defined types were placed in this encounter.   Medication Changes: No orders of the defined types were placed in this encounter.   Disposition:  Follow up {follow up:15908}  Signed, Satira Sark, MD, [facility] 03/13/2021 4:10 PM    [facility] at [facility] [address], [place], [place] [address] Phone: [phone]; Fax: [phone]  [phone]  

## 2021-03-15 ENCOUNTER — Ambulatory Visit: Payer: 59 | Admitting: Cardiology

## 2021-03-15 DIAGNOSIS — I340 Nonrheumatic mitral (valve) insufficiency: Secondary | ICD-10-CM

## 2021-03-20 ENCOUNTER — Other Ambulatory Visit (HOSPITAL_COMMUNITY): Payer: Self-pay

## 2021-03-20 ENCOUNTER — Other Ambulatory Visit (INDEPENDENT_AMBULATORY_CARE_PROVIDER_SITE_OTHER): Payer: Self-pay

## 2021-03-21 ENCOUNTER — Other Ambulatory Visit (HOSPITAL_COMMUNITY): Payer: Self-pay

## 2021-03-21 MED ORDER — PROGESTERONE 200 MG PO CAPS
ORAL_CAPSULE | ORAL | 1 refills | Status: DC
  Filled 2021-03-21: qty 90, 90d supply, fill #0
  Filled 2021-06-19: qty 90, 90d supply, fill #1
  Filled 2021-06-19: qty 30, 30d supply, fill #1

## 2021-03-21 MED ORDER — ESTRADIOL 0.5 MG PO TABS
0.5000 mg | ORAL_TABLET | Freq: Every day | ORAL | 1 refills | Status: DC
  Filled 2021-03-21: qty 90, 90d supply, fill #0
  Filled 2021-06-19: qty 90, 90d supply, fill #1
  Filled 2021-06-19: qty 30, 30d supply, fill #1

## 2021-03-28 ENCOUNTER — Other Ambulatory Visit (HOSPITAL_COMMUNITY): Payer: Self-pay

## 2021-03-28 DIAGNOSIS — M533 Sacrococcygeal disorders, not elsewhere classified: Secondary | ICD-10-CM | POA: Diagnosis not present

## 2021-04-24 ENCOUNTER — Other Ambulatory Visit (HOSPITAL_COMMUNITY): Payer: Self-pay

## 2021-04-24 MED ORDER — HYDROXYCHLOROQUINE SULFATE 200 MG PO TABS
ORAL_TABLET | ORAL | 1 refills | Status: DC
  Filled 2021-04-24: qty 60, 30d supply, fill #0
  Filled 2021-07-27: qty 60, 30d supply, fill #1

## 2021-06-04 ENCOUNTER — Other Ambulatory Visit (HOSPITAL_COMMUNITY): Payer: Self-pay

## 2021-06-04 MED ORDER — NIRMATRELVIR&RITONAVIR 300/100 20 X 150 MG & 10 X 100MG PO TBPK
ORAL_TABLET | ORAL | 0 refills | Status: DC
  Filled 2021-06-04: qty 30, 5d supply, fill #0

## 2021-06-08 ENCOUNTER — Other Ambulatory Visit (HOSPITAL_COMMUNITY): Payer: Self-pay

## 2021-06-19 ENCOUNTER — Other Ambulatory Visit (HOSPITAL_COMMUNITY): Payer: Self-pay

## 2021-06-19 MED ORDER — GUAIATUSSIN AC 100-10 MG/5ML PO SYRP
5.0000 mL | ORAL_SOLUTION | Freq: Three times a day (TID) | ORAL | 0 refills | Status: DC
Start: 2021-06-19 — End: 2022-01-30
  Filled 2021-06-19: qty 210, 14d supply, fill #0

## 2021-06-20 ENCOUNTER — Encounter (INDEPENDENT_AMBULATORY_CARE_PROVIDER_SITE_OTHER): Payer: Self-pay | Admitting: *Deleted

## 2021-06-20 ENCOUNTER — Other Ambulatory Visit (HOSPITAL_COMMUNITY): Payer: Self-pay

## 2021-07-02 NOTE — Progress Notes (Signed)
? ? ?Cardiology Office Note ? ?Date: 07/03/2021  ? ?ID: Victoria Cooper, DOB 1956/02/03, MRN [id number] ? ?PCP:  Deland Pretty, MD  ?Cardiologist:  Rozann Lesches, MD ?Electrophysiologist:  None  ? ?Chief Complaint  ?Patient presents with  ? Cardiac follow-up  ? ? ?History of Present Illness: ?Victoria Cooper is a 66 y.o. female last seen in October 2020.  She is here for a follow-up visit, enjoying retirement.  She has been trying to walk at least a mile most days of the week, weather dependent.  She does not report any progressive sense of palpitations, has had no sudden dizziness or syncope. ? ?I personally reviewed her ECG today which shows normal sinus rhythm. ? ?Echocardiogram from October 2020 revealed LVEF 55 to 60% range and moderate mitral regurgitation with normal left atrial chamber size and estimated PASP. ? ?She is following annually with PCP.  Now on Crestor for primary prevention. ? ?Past Medical History:  ?Diagnosis Date  ? Abnormal Pap smear of cervix 2006  ? Repeat Pap normal  ? Allergy   ? Animal bite 03/18/2011  ? Dog Bite  ? Arthritis   ? Asthma   ? Classical migraine with intractable migraine 11/30/2015  ? Connective tissue disease, undifferentiated ([place]) 2015  ? Dr. Amil Amen  ? Female hypogonadism syndrome 01/13/2019  ? GERD (gastroesophageal reflux disease)   ? Hyperlipidemia   ? Hypertension   ? Migraine   ? Vitamin D deficiency disease 01/13/2019  ? Wears glasses   ? ? ?Past Surgical History:  ?Procedure Laterality Date  ? ABDOMINAL HYSTERECTOMY  2003  ? hyst  ? BACK SURGERY  2009  ? lumb lam-fusion  ? Basil cell    ? BREAST REDUCTION SURGERY  05/26/2012  ? Procedure: MAMMARY REDUCTION  (BREAST);  Surgeon: Erline Hau, MD;  Location: [facility];  Service: Plastics;  Laterality: Bilateral;  ? COLONOSCOPY  2003, 2018  ? diverticulosis  ? ? ?Current Outpatient Medications  ?Medication Sig Dispense Refill  ? Cholecalciferol (VITAMIN D-3) 125 MCG (5000 UT) TABS Take 10,000  Units by mouth daily.    ? estradiol (ESTRACE) 0.5 MG tablet TAKE 1 TABLET BY MOUTH ONCE A DAY 90 tablet 1  ? hydroxychloroquine (PLAQUENIL) 200 MG tablet Take 1 tablet by mouth 2 times daily with food or milk. 60 tablet 1  ? progesterone (PROMETRIUM) 200 MG capsule Take 1 capsule (200 mg total) by mouth every evening. 90 capsule 1  ? rizatriptan (MAXALT) 10 MG tablet Take 10 mg by mouth as needed.    ? rosuvastatin (CRESTOR) 20 MG tablet Take 1 tablet by mouth daily 90 tablet 3  ? thyroid (ARMOUR) 90 MG tablet Take 90 mg by mouth daily.    ? traZODone (DESYREL) 150 MG tablet Take 1 tablet by mouth once daily at bedtime 90 tablet 3  ? valsartan-hydrochlorothiazide (DIOVAN-HCT) 160-12.5 MG tablet TAKE 1 TABLET BY MOUTH ONCE DAILY 90 tablet 3  ? guaiFENesin-codeine (GUAIATUSSIN AC) 100-10 MG/5ML syrup Take 5 mLs by mouth 3 (three) times daily. (Patient not taking: Reported on 07/03/2021) 210 mL 0  ? hydroxychloroquine (PLAQUENIL) 200 MG tablet Take by mouth daily. (Patient not taking: Reported on 07/03/2021)    ? MELATONIN PO Take by mouth at bedtime. (Patient not taking: Reported on 07/03/2021)    ? nirmatrelvir & ritonavir (PAXLOVID) 20 x 150 MG & 10 x '100MG'$  TBPK Take 3 tablets by mouth twice daily. (Patient not taking: Reported on 07/03/2021) 30 tablet 0  ?  NONFORMULARY OR COMPOUNDED ITEM Inject 5 mg into the muscle every 7 (seven) days. Testosterone cypionate in olive  oil (25 mg/mL).  Dispense 5 mL vial. (Patient not taking: Reported on 07/03/2021) 1 each 3  ? NP THYROID 120 MG tablet Take 1 tablet (120 mg total) by mouth daily before breakfast. (Patient not taking: Reported on 07/03/2021) 30 tablet 3  ? thyroid (NP THYROID) 120 MG tablet Take 1 tablet (120 mg total) by mouth daily on an empty stomach. (Patient not taking: Reported on 07/03/2021) 90 tablet 1  ? traZODone (DESYREL) 50 MG tablet Take 1-1.5 tablets (50-75 mg total) by mouth at bedtime as needed. (Patient not taking: Reported on 07/03/2021) 135 tablet 0  ?  valsartan-hydrochlorothiazide (DIOVAN HCT) 160-12.5 MG per tablet Take 1 tablet by mouth daily. (Patient not taking: Reported on 07/03/2021) 30 tablet 11  ? valsartan-hydrochlorothiazide (DIOVAN-HCT) 160-12.5 MG tablet TAKE 1 TABLET BY MOUTH ONCE DAILY 90 tablet 3  ? ?No current facility-administered medications for this visit.  ? ?Allergies:  Morphine and related and Sulfa antibiotics  ? ?ROS: No syncope. ? ?Physical Exam: ?VS:  BP 112/78   Pulse 82   Ht '5\' 2"'$  (1.575 m)   Wt 164 lb (74.4 kg)   LMP 04/29/2001 (LMP Unknown)   SpO2 97%   BMI 30.00 kg/m? , BMI Body mass index is 30 kg/m?. ? ?Wt Readings from Last 3 Encounters:  ?07/03/21 164 lb (74.4 kg)  ?08/22/20 164 lb (74.4 kg)  ?07/14/20 162 lb (73.5 kg)  ?  ?General: Patient appears comfortable at rest. ?HEENT: Conjunctiva and lids normal, wearing a mask. ?Neck: Supple, no elevated JVP or carotid bruits, no thyromegaly. ?Lungs: Clear to auscultation, nonlabored breathing at rest. ?Cardiac: Regular rate and rhythm, no S3, 1/6 systolic murmur, no pericardial rub. ? ?ECG:  An ECG dated 07/03/2018 was personally reviewed today and demonstrated:  Sinus rhythm with PAC. ? ?Recent Labwork: ?   ?Component Value Date/Time  ? CHOL 204 (H) 05/09/2020 1747  ? TRIG 112 05/09/2020 1747  ? HDL 56 05/09/2020 1747  ? CHOLHDL 3.6 05/09/2020 1747  ? [place] 126 (H) 05/09/2020 1747  ?January 2022: TSH 1.72, BUN 18, creatinine 0.69, potassium 3.7, AST 18, ALT 23 ? ?Other Studies Reviewed Today: ? ?Echocardiogram 02/26/2019: ? 1. Left ventricular ejection fraction, by visual estimation, is 55 to  ?60%. The left ventricle has normal function. There is no left ventricular  ?hypertrophy.  ? 2. Left ventricular diastolic parameters are consistent with Grade I  ?diastolic dysfunction (impaired relaxation).  ? 3. Global right ventricle has normal systolic function.The right  ?ventricular size is normal. No increase in right ventricular wall  ?thickness.  ? 4. Left atrial size was normal.   ? 5. Right atrial size was normal.  ? 6. The mitral valve is grossly normal. Moderate mitral valve  ?regurgitation.  ? 7. The tricuspid valve is grossly normal. Tricuspid valve regurgitation  ?is trivial.  ? 8. The aortic valve is tricuspid. Aortic valve regurgitation is not  ?visualized. No evidence of aortic valve sclerosis or stenosis.  ? 9. The pulmonic valve was grossly normal. Pulmonic valve regurgitation is  ?not visualized.  ?10. Normal pulmonary artery systolic pressure.  ?11. The inferior vena cava is normal in size with greater than 50%  ?respiratory variability, suggesting right atrial pressure of 3 mmHg.  ? ?Assessment and Plan: ? ?1.  History of palpitations, no progressive symptoms or syncope.  ECG is normal today.  Would continue with observation.  If symptoms escalate would consider a Zio patch. ? ?2.  Essential hypertension, blood pressure is well controlled today.  She is on Diovan HCTZ. ? ?3.  Mitral regurgitation, moderate by echocardiogram in 2020.  No significant change in heart murmur or obvious symptomatology.  We will consider a follow-up echocardiogram around time of her next visit. ? ?Medication Adjustments/Labs and Tests Ordered: ?Current medicines are reviewed at length with the patient today.  Concerns regarding medicines are outlined above.  ? ?Tests Ordered: ?Orders Placed This Encounter  ?Procedures  ? EKG 12-Lead  ? ? ?Medication Changes: ?No orders of the defined types were placed in this encounter. ? ? ?Disposition:  Follow up  1 year. ? ?Signed, ?Satira Sark, MD, [facility] ?07/03/2021 8:53 AM    ?[facility] at [facility] ?[address], [place], [place] [address] ?Phone: [phone]; Fax: [phone]  ?

## 2021-07-03 ENCOUNTER — Encounter: Payer: Self-pay | Admitting: Cardiology

## 2021-07-03 ENCOUNTER — Other Ambulatory Visit: Payer: Self-pay

## 2021-07-03 ENCOUNTER — Ambulatory Visit: Payer: 59 | Admitting: Cardiology

## 2021-07-03 VITALS — BP 112/78 | HR 82 | Ht 62.0 in | Wt 164.0 lb

## 2021-07-03 DIAGNOSIS — R002 Palpitations: Secondary | ICD-10-CM

## 2021-07-03 DIAGNOSIS — I340 Nonrheumatic mitral (valve) insufficiency: Secondary | ICD-10-CM

## 2021-07-03 DIAGNOSIS — I10 Essential (primary) hypertension: Secondary | ICD-10-CM

## 2021-07-03 NOTE — Patient Instructions (Signed)
Medication Instructions:  ?Your physician recommends that you continue on your current medications as directed. Please refer to the Current Medication list given to you today. ? ? ?Labwork: ?None  ? ?Testing/Procedures: ?none ? ?Follow-Up: ?1 year ? ?Any Other Special Instructions Will Be Listed Below (If Applicable). ? ?If you need a refill on your cardiac medications before your next appointment, please call your pharmacy. ? ?

## 2021-07-14 ENCOUNTER — Other Ambulatory Visit (HOSPITAL_COMMUNITY): Payer: Self-pay

## 2021-07-19 ENCOUNTER — Ambulatory Visit (INDEPENDENT_AMBULATORY_CARE_PROVIDER_SITE_OTHER): Payer: 59 | Admitting: Obstetrics and Gynecology

## 2021-07-19 ENCOUNTER — Encounter: Payer: Self-pay | Admitting: Obstetrics and Gynecology

## 2021-07-19 ENCOUNTER — Other Ambulatory Visit: Payer: Self-pay

## 2021-07-19 VITALS — BP 122/78 | Ht 60.5 in | Wt 161.0 lb

## 2021-07-19 DIAGNOSIS — N8110 Cystocele, unspecified: Secondary | ICD-10-CM

## 2021-07-19 DIAGNOSIS — Z01419 Encounter for gynecological examination (general) (routine) without abnormal findings: Secondary | ICD-10-CM

## 2021-07-19 DIAGNOSIS — Z23 Encounter for immunization: Secondary | ICD-10-CM | POA: Diagnosis not present

## 2021-07-19 DIAGNOSIS — N816 Rectocele: Secondary | ICD-10-CM | POA: Diagnosis not present

## 2021-07-19 DIAGNOSIS — Z780 Asymptomatic menopausal state: Secondary | ICD-10-CM

## 2021-07-19 NOTE — Progress Notes (Signed)
66 y.o. G54P3003 Married Caucasian female here for annual exam.   ? ?On HRT initiated after menopause.  ?Takes estradiol 0.5 mg and Prometrium 200 mg through her PCP.  ? ?Some increased urinary urgency and leakage.  ?She thinks her prolapse is worse. ?Pessary was painful.  ?Did pelvic floor PT, and this was helpful.  ?Voiding well.  ?BMs are regular and no splinting.  ?Some fecal urgency in the mornings at home.  No issues if she is traveling.  ? ?With her first child, 10 pounds, 4 ounces, she had a fourth degree laceration.  ?Other deliveries were children over 9 pounds.  ? ?PCP:   Deland Pretty, MD ? ?Patient's last menstrual period was 04/29/2001 (lmp unknown).     ?  ?    ?Sexually active: Yes.    ?The current method of family planning is status post hysterectomy.    ?Exercising: Yes.    Home exercise ?Smoker:  no ? ?Health Maintenance: ?Pap:  2012 Neg ?History of abnormal Pap:  yes, repeat pap normal ?MMG:  10-18-20 Neg/BiRads1 ?Colonoscopy:  2018 normal ?BMD:   2012  Result  :Normal ?TDaP:  2012 ?Gardasil:   no ?HIV: 07-18-16 Neg ?Hep C: 07-18-16 Neg ?Screening Labs:  PCP ? ? reports that she has never smoked. She has never used smokeless tobacco. She reports current alcohol use. She reports that she does not use drugs. ? ?Past Medical History:  ?Diagnosis Date  ? Abnormal Pap smear of cervix 2006  ? Repeat Pap normal  ? Allergy   ? Animal bite 03/18/2011  ? Dog Bite  ? Arthritis   ? Asthma   ? Classical migraine with intractable migraine 11/30/2015  ? Connective tissue disease, undifferentiated ([place]) 2015  ? Dr. Amil Amen  ? Female hypogonadism syndrome 01/13/2019  ? GERD (gastroesophageal reflux disease)   ? Hyperlipidemia   ? Hypertension   ? Migraine   ? Vitamin D deficiency disease 01/13/2019  ? Wears glasses   ? ? ?Past Surgical History:  ?Procedure Laterality Date  ? ABDOMINAL HYSTERECTOMY  2003  ? hyst  ? BACK SURGERY  2009  ? lumb lam-fusion  ? Basil cell    ? BREAST REDUCTION SURGERY  05/26/2012  ? Procedure:  MAMMARY REDUCTION  (BREAST);  Surgeon: Erline Hau, MD;  Location: [facility];  Service: Plastics;  Laterality: Bilateral;  ? COLONOSCOPY  2003, 2018  ? diverticulosis  ? ? ?Current Outpatient Medications  ?Medication Sig Dispense Refill  ? Cholecalciferol (VITAMIN D-3) 125 MCG (5000 UT) TABS Take 10,000 Units by mouth daily.    ? estradiol (ESTRACE) 0.5 MG tablet TAKE 1 TABLET BY MOUTH ONCE A DAY 90 tablet 1  ? hydroxychloroquine (PLAQUENIL) 200 MG tablet Take 1 tablet by mouth 2 times daily with food or milk. 60 tablet 1  ? progesterone (PROMETRIUM) 200 MG capsule Take 1 capsule (200 mg total) by mouth every evening. 90 capsule 1  ? rizatriptan (MAXALT) 10 MG tablet Take 10 mg by mouth as needed.    ? rosuvastatin (CRESTOR) 20 MG tablet Take 1 tablet by mouth daily 90 tablet 3  ? traZODone (DESYREL) 150 MG tablet Take 1 tablet by mouth once daily at bedtime 90 tablet 3  ? guaiFENesin-codeine (GUAIATUSSIN AC) 100-10 MG/5ML syrup Take 5 mLs by mouth 3 (three) times daily. (Patient not taking: Reported on 07/03/2021) 210 mL 0  ? hydroxychloroquine (PLAQUENIL) 200 MG tablet Take by mouth daily. (Patient not taking: Reported on 07/19/2021)    ?  MELATONIN PO Take by mouth at bedtime. (Patient not taking: Reported on 07/03/2021)    ? nirmatrelvir & ritonavir (PAXLOVID) 20 x 150 MG & 10 x '100MG'$  TBPK Take 3 tablets by mouth twice daily. (Patient not taking: Reported on 07/03/2021) 30 tablet 0  ? NONFORMULARY OR COMPOUNDED ITEM Inject 5 mg into the muscle every 7 (seven) days. Testosterone cypionate in olive  oil (25 mg/mL).  Dispense 5 mL vial. (Patient not taking: Reported on 07/03/2021) 1 each 3  ? NP THYROID 120 MG tablet Take 1 tablet (120 mg total) by mouth daily before breakfast. (Patient not taking: Reported on 07/03/2021) 30 tablet 3  ? thyroid (ARMOUR) 90 MG tablet Take 90 mg by mouth daily. (Patient not taking: Reported on 07/19/2021)    ? thyroid (NP THYROID) 120 MG tablet Take 1 tablet (120 mg total)  by mouth daily on an empty stomach. (Patient not taking: Reported on 07/03/2021) 90 tablet 1  ? traZODone (DESYREL) 50 MG tablet Take 1-1.5 tablets (50-75 mg total) by mouth at bedtime as needed. (Patient not taking: Reported on 07/03/2021) 135 tablet 0  ? valsartan-hydrochlorothiazide (DIOVAN HCT) 160-12.5 MG per tablet Take 1 tablet by mouth daily. (Patient not taking: Reported on 07/19/2021) 30 tablet 11  ? valsartan-hydrochlorothiazide (DIOVAN-HCT) 160-12.5 MG tablet TAKE 1 TABLET BY MOUTH ONCE DAILY 90 tablet 3  ? valsartan-hydrochlorothiazide (DIOVAN-HCT) 160-12.5 MG tablet TAKE 1 TABLET BY MOUTH ONCE DAILY (Patient not taking: Reported on 07/19/2021) 90 tablet 3  ? ?No current facility-administered medications for this visit.  ? ? ?Family History  ?Problem Relation Age of Onset  ? Heart disease Father   ? Lung cancer Mother   ?     never smoker  ? ALS Maternal Grandfather   ? Melanoma Paternal Grandmother   ? Brain cancer Paternal Grandmother   ? Stroke Paternal Grandfather   ? Heart failure Maternal Grandmother   ? Ulcerative colitis Brother   ? Migraines Neg Hx   ? Colon cancer Neg Hx   ? ? ?Review of Systems  ?All other systems reviewed and are negative. ? ?Exam:   ?BP 122/78   Ht 5' 0.5" (1.537 m)   Wt 161 lb (73 kg)   LMP 04/29/2001 (LMP Unknown)   BMI 30.93 kg/m?     ?General appearance: alert, cooperative and appears stated age ?Head: normocephalic, without obvious abnormality, atraumatic ?Neck: no adenopathy, supple, symmetrical, trachea midline and thyroid normal to inspection and palpation ?Lungs: clear to auscultation bilaterally ?Breasts:  consistent with reduction, no masses or tenderness, No nipple retraction or dimpling, No nipple discharge or bleeding, No axillary adenopathy ?Heart: regular rate and rhythm ?Abdomen: soft, non-tender; no masses, no organomegaly ?Extremities: extremities normal, atraumatic, no cyanosis or edema ?Skin: skin color, texture, turgor normal. No rashes or lesions ?Lymph  nodes: cervical, supraclavicular, and axillary nodes normal. ?Neurologic: grossly normal ? ?Pelvic: External genitalia:  no lesions ?             No abnormal inguinal nodes palpated. ?             Urethra:  normal appearing urethra with no masses, tenderness or lesions ?             Bartholins and Skenes: normal    ?             Vagina: normal appearing vagina with normal color and discharge, no lesions  Third degree cystocele and second degree rectocele.  Good apical support.  ?  Cervix: no lesions ?             Pap taken: no ?Bimanual Exam:  Uterus:  normal size, contour, position, consistency, mobility, non-tender ?             Adnexa: no mass, fullness, tenderness ?             Rectal exam: yes.  Confirms. ?             Anus:  normal sphincter tone, no lesions ? ?Chaperone was present for exam: yes ? ?Assessment:   ?Well woman visit with gynecologic exam. ?Status post TAH.  Ovaries remain.  ?HRT, treatment initiated after menopause. ?Third degree cystocele, second degree rectocele.  Progression of prolapse noted.  ?Bilateral breast reduction.  ?Connective tissue disorder.  On Plaquenil  ? ?Plan: ?Mammogram screening discussed. ?Self breast awareness reviewed. ?Pap and HR HPV as above. ?Guidelines for Calcium, Vitamin D, regular exercise program including cardiovascular and weight bearing exercise. ?TDap vaccine.  ?We discussed her HRT and risks of stroke, MI, DVT, PE and breast cancer.  ?Her Prometrium is not necessary due to her prior hysterectomy, and discontinuation may reduce risk of breast cancer and MI. ?She will review her HRT with her PCP this spring.   ?We also reviewed her prolapse and potential surgical correction with an anterior and posterior colporrhaphy and possible midurethral sling.  ?She would need urodynamic testing with reduction of prolapse prior to surgery to define her urinary continence needs.  ?I did recommend 3 months of no lifting over 10 pounds post op. ?She will consider  her options.  ?BMD ordered.  ?Follow up annually and prn.  ? ?After visit summary provided.  ? ? ? ?

## 2021-07-19 NOTE — Patient Instructions (Signed)

## 2021-07-20 ENCOUNTER — Other Ambulatory Visit: Payer: Self-pay | Admitting: Obstetrics and Gynecology

## 2021-07-20 DIAGNOSIS — Z1231 Encounter for screening mammogram for malignant neoplasm of breast: Secondary | ICD-10-CM

## 2021-07-27 ENCOUNTER — Other Ambulatory Visit (HOSPITAL_COMMUNITY): Payer: Self-pay

## 2021-08-18 ENCOUNTER — Other Ambulatory Visit (HOSPITAL_COMMUNITY): Payer: Self-pay

## 2021-09-03 DIAGNOSIS — D225 Melanocytic nevi of trunk: Secondary | ICD-10-CM | POA: Diagnosis not present

## 2021-09-03 DIAGNOSIS — Z1283 Encounter for screening for malignant neoplasm of skin: Secondary | ICD-10-CM | POA: Diagnosis not present

## 2021-09-07 ENCOUNTER — Other Ambulatory Visit (HOSPITAL_COMMUNITY): Payer: Self-pay

## 2021-09-07 MED ORDER — HYDROXYCHLOROQUINE SULFATE 200 MG PO TABS
ORAL_TABLET | ORAL | 1 refills | Status: DC
  Filled 2021-09-07: qty 60, 30d supply, fill #0
  Filled 2021-10-26: qty 60, 30d supply, fill #1

## 2021-09-11 DIAGNOSIS — Z0000 Encounter for general adult medical examination without abnormal findings: Secondary | ICD-10-CM | POA: Diagnosis not present

## 2021-09-13 DIAGNOSIS — I700 Atherosclerosis of aorta: Secondary | ICD-10-CM | POA: Diagnosis not present

## 2021-09-13 DIAGNOSIS — E039 Hypothyroidism, unspecified: Secondary | ICD-10-CM | POA: Diagnosis not present

## 2021-09-13 DIAGNOSIS — G5603 Carpal tunnel syndrome, bilateral upper limbs: Secondary | ICD-10-CM | POA: Diagnosis not present

## 2021-09-13 DIAGNOSIS — R768 Other specified abnormal immunological findings in serum: Secondary | ICD-10-CM | POA: Diagnosis not present

## 2021-09-13 DIAGNOSIS — M3501 Sicca syndrome with keratoconjunctivitis: Secondary | ICD-10-CM | POA: Diagnosis not present

## 2021-09-13 DIAGNOSIS — J387 Other diseases of larynx: Secondary | ICD-10-CM | POA: Diagnosis not present

## 2021-09-13 DIAGNOSIS — M359 Systemic involvement of connective tissue, unspecified: Secondary | ICD-10-CM | POA: Diagnosis not present

## 2021-09-13 DIAGNOSIS — I10 Essential (primary) hypertension: Secondary | ICD-10-CM | POA: Diagnosis not present

## 2021-09-13 DIAGNOSIS — I340 Nonrheumatic mitral (valve) insufficiency: Secondary | ICD-10-CM | POA: Diagnosis not present

## 2021-09-27 ENCOUNTER — Other Ambulatory Visit (HOSPITAL_COMMUNITY): Payer: Self-pay

## 2021-09-27 MED ORDER — PROGESTERONE 200 MG PO CAPS
ORAL_CAPSULE | ORAL | 1 refills | Status: DC
  Filled 2021-09-27: qty 90, 90d supply, fill #0
  Filled 2021-12-21: qty 90, 90d supply, fill #1

## 2021-09-27 MED ORDER — ESTRADIOL 0.5 MG PO TABS
0.5000 mg | ORAL_TABLET | Freq: Every day | ORAL | 1 refills | Status: DC
  Filled 2021-09-27: qty 90, 90d supply, fill #0
  Filled 2021-12-21: qty 90, 90d supply, fill #1

## 2021-10-10 ENCOUNTER — Other Ambulatory Visit (HOSPITAL_COMMUNITY): Payer: Self-pay

## 2021-10-10 MED ORDER — AZITHROMYCIN 250 MG PO TABS
ORAL_TABLET | ORAL | 0 refills | Status: AC
  Filled 2021-10-10: qty 6, 5d supply, fill #0

## 2021-10-10 MED ORDER — OMRON 3 SERIES BP MONITOR DEVI
0 refills | Status: DC
  Filled 2021-10-10: qty 1, 30d supply, fill #0

## 2021-10-16 DIAGNOSIS — E785 Hyperlipidemia, unspecified: Secondary | ICD-10-CM | POA: Diagnosis not present

## 2021-10-16 DIAGNOSIS — R768 Other specified abnormal immunological findings in serum: Secondary | ICD-10-CM | POA: Diagnosis not present

## 2021-10-16 DIAGNOSIS — M79646 Pain in unspecified finger(s): Secondary | ICD-10-CM | POA: Diagnosis not present

## 2021-10-16 DIAGNOSIS — I10 Essential (primary) hypertension: Secondary | ICD-10-CM | POA: Diagnosis not present

## 2021-10-16 DIAGNOSIS — M1990 Unspecified osteoarthritis, unspecified site: Secondary | ICD-10-CM | POA: Diagnosis not present

## 2021-10-16 DIAGNOSIS — Z79899 Other long term (current) drug therapy: Secondary | ICD-10-CM | POA: Diagnosis not present

## 2021-10-16 DIAGNOSIS — M3501 Sicca syndrome with keratoconjunctivitis: Secondary | ICD-10-CM | POA: Diagnosis not present

## 2021-10-16 DIAGNOSIS — M359 Systemic involvement of connective tissue, unspecified: Secondary | ICD-10-CM | POA: Diagnosis not present

## 2021-10-26 ENCOUNTER — Other Ambulatory Visit (HOSPITAL_COMMUNITY): Payer: Self-pay

## 2021-10-29 ENCOUNTER — Other Ambulatory Visit (HOSPITAL_COMMUNITY): Payer: Self-pay

## 2021-10-29 MED ORDER — VALSARTAN-HYDROCHLOROTHIAZIDE 160-12.5 MG PO TABS
1.0000 | ORAL_TABLET | Freq: Every day | ORAL | 3 refills | Status: DC
  Filled 2021-10-29: qty 90, 90d supply, fill #0
  Filled 2022-01-22: qty 90, 90d supply, fill #1
  Filled 2022-04-23: qty 90, 90d supply, fill #2
  Filled 2022-09-03: qty 90, 90d supply, fill #3

## 2021-12-10 ENCOUNTER — Other Ambulatory Visit (HOSPITAL_COMMUNITY): Payer: Self-pay

## 2021-12-10 MED ORDER — HYDROXYCHLOROQUINE SULFATE 200 MG PO TABS
ORAL_TABLET | ORAL | 0 refills | Status: DC
  Filled 2021-12-10: qty 60, 30d supply, fill #0

## 2021-12-10 MED ORDER — TRAZODONE HCL 150 MG PO TABS
150.0000 mg | ORAL_TABLET | Freq: Every day | ORAL | 3 refills | Status: DC
Start: 2021-12-10 — End: 2022-12-09
  Filled 2021-12-10: qty 90, 90d supply, fill #0
  Filled 2022-03-05: qty 90, 90d supply, fill #1
  Filled 2022-06-17: qty 90, 90d supply, fill #2
  Filled 2022-09-03: qty 90, 90d supply, fill #3

## 2021-12-21 ENCOUNTER — Other Ambulatory Visit (HOSPITAL_COMMUNITY): Payer: Self-pay

## 2022-01-07 ENCOUNTER — Ambulatory Visit
Admission: RE | Admit: 2022-01-07 | Discharge: 2022-01-07 | Disposition: A | Discharge status: Home / Self Care | Payer: Self-pay | Source: Ambulatory Visit | Attending: Obstetrics and Gynecology | Admitting: Obstetrics and Gynecology

## 2022-01-07 DIAGNOSIS — Z1231 Encounter for screening mammogram for malignant neoplasm of breast: Secondary | ICD-10-CM

## 2022-01-07 DIAGNOSIS — Z780 Asymptomatic menopausal state: Secondary | ICD-10-CM | POA: Diagnosis not present

## 2022-01-07 DIAGNOSIS — M85852 Other specified disorders of bone density and structure, left thigh: Secondary | ICD-10-CM | POA: Diagnosis not present

## 2022-01-22 ENCOUNTER — Other Ambulatory Visit (HOSPITAL_COMMUNITY): Payer: Self-pay

## 2022-01-22 MED ORDER — PROGESTERONE 200 MG PO CAPS
200.0000 mg | ORAL_CAPSULE | Freq: Every evening | ORAL | 1 refills | Status: DC
  Filled 2022-01-22 – 2022-03-05 (×2): qty 90, 90d supply, fill #0
  Filled 2022-06-17: qty 90, 90d supply, fill #1

## 2022-01-22 MED ORDER — ROSUVASTATIN CALCIUM 20 MG PO TABS
20.0000 mg | ORAL_TABLET | Freq: Every day | ORAL | 3 refills | Status: DC
  Filled 2022-01-22: qty 90, 90d supply, fill #0
  Filled 2022-04-23: qty 90, 90d supply, fill #1
  Filled 2022-09-03: qty 90, 90d supply, fill #2
  Filled 2022-12-08: qty 90, 90d supply, fill #3

## 2022-01-22 MED ORDER — ESTRADIOL 0.5 MG PO TABS
0.5000 mg | ORAL_TABLET | Freq: Every day | ORAL | 6 refills | Status: DC
  Filled 2022-01-22 – 2022-03-26 (×2): qty 90, 90d supply, fill #0
  Filled 2022-07-17: qty 90, 90d supply, fill #1
  Filled 2022-09-03 – 2022-12-01 (×2): qty 90, 90d supply, fill #2

## 2022-01-23 ENCOUNTER — Emergency Department (HOSPITAL_COMMUNITY): Payer: 59

## 2022-01-23 ENCOUNTER — Encounter (HOSPITAL_COMMUNITY): Payer: Self-pay | Admitting: *Deleted

## 2022-01-23 ENCOUNTER — Other Ambulatory Visit: Payer: Self-pay

## 2022-01-23 ENCOUNTER — Other Ambulatory Visit (HOSPITAL_COMMUNITY): Payer: Self-pay

## 2022-01-23 ENCOUNTER — Emergency Department (HOSPITAL_COMMUNITY)
Admission: EM | Admit: 2022-01-23 | Discharge: 2022-01-23 | Disposition: A | Discharge status: Home / Self Care | Payer: 59 | Attending: Emergency Medicine | Admitting: Emergency Medicine

## 2022-01-23 DIAGNOSIS — G43809 Other migraine, not intractable, without status migrainosus: Secondary | ICD-10-CM | POA: Insufficient documentation

## 2022-01-23 DIAGNOSIS — I10 Essential (primary) hypertension: Secondary | ICD-10-CM | POA: Diagnosis not present

## 2022-01-23 DIAGNOSIS — Z79899 Other long term (current) drug therapy: Secondary | ICD-10-CM | POA: Diagnosis not present

## 2022-01-23 DIAGNOSIS — R531 Weakness: Secondary | ICD-10-CM | POA: Diagnosis not present

## 2022-01-23 DIAGNOSIS — M6281 Muscle weakness (generalized): Secondary | ICD-10-CM | POA: Insufficient documentation

## 2022-01-23 DIAGNOSIS — R42 Dizziness and giddiness: Secondary | ICD-10-CM | POA: Diagnosis not present

## 2022-01-23 DIAGNOSIS — R519 Headache, unspecified: Secondary | ICD-10-CM | POA: Diagnosis not present

## 2022-01-23 DIAGNOSIS — R29818 Other symptoms and signs involving the nervous system: Secondary | ICD-10-CM | POA: Diagnosis not present

## 2022-01-23 LAB — DIFFERENTIAL
Abs Immature Granulocytes: 0.01 10*3/uL (ref 0.00–0.07)
Basophils Absolute: 0.1 10*3/uL (ref 0.0–0.1)
Basophils Relative: 1 %
Eosinophils Absolute: 0.1 10*3/uL (ref 0.0–0.5)
Eosinophils Relative: 2 %
Immature Granulocytes: 0 %
Lymphocytes Relative: 43 %
Lymphs Abs: 3.1 10*3/uL (ref 0.7–4.0)
Monocytes Absolute: 0.6 10*3/uL (ref 0.1–1.0)
Monocytes Relative: 9 %
Neutro Abs: 3.2 10*3/uL (ref 1.7–7.7)
Neutrophils Relative %: 45 %

## 2022-01-23 LAB — CBC
HCT: 43.1 % (ref 36.0–46.0)
Hemoglobin: 14.5 g/dL (ref 12.0–15.0)
MCH: 31.3 pg (ref 26.0–34.0)
MCHC: 33.6 g/dL (ref 30.0–36.0)
MCV: 92.9 fL (ref 80.0–100.0)
Platelets: 189 10*3/uL (ref 150–400)
RBC: 4.64 MIL/uL (ref 3.87–5.11)
RDW: 12.6 % (ref 11.5–15.5)
WBC: 7.1 10*3/uL (ref 4.0–10.5)
nRBC: 0 % (ref 0.0–0.2)

## 2022-01-23 LAB — COMPREHENSIVE METABOLIC PANEL
ALT: 30 U/L (ref 0–44)
AST: 26 U/L (ref 15–41)
Albumin: 4.2 g/dL (ref 3.5–5.0)
Alkaline Phosphatase: 39 U/L (ref 38–126)
Anion gap: 11 (ref 5–15)
BUN: 14 mg/dL (ref 8–23)
CO2: 24 mmol/L (ref 22–32)
Calcium: 9.4 mg/dL (ref 8.9–10.3)
Chloride: 103 mmol/L (ref 98–111)
Creatinine, Ser: 0.64 mg/dL (ref 0.44–1.00)
GFR, Estimated: >60 mL/min (ref >60)
Glucose, Bld: 97 mg/dL (ref 70–99)
Potassium: 3.5 mmol/L (ref 3.5–5.1)
Sodium: 138 mmol/L (ref 135–145)
Total Bilirubin: 0.7 mg/dL (ref 0.3–1.2)
Total Protein: 7.3 g/dL (ref 6.5–8.1)

## 2022-01-23 LAB — I-STAT CHEM 8, ED
BUN: 13 mg/dL (ref 8–23)
Calcium, Ion: 1.2 mmol/L (ref 1.15–1.40)
Chloride: 101 mmol/L (ref 98–111)
Creatinine, Ser: 0.6 mg/dL (ref 0.44–1.00)
Glucose, Bld: 94 mg/dL (ref 70–99)
HCT: 42 % (ref 36.0–46.0)
Hemoglobin: 14.3 g/dL (ref 12.0–15.0)
Potassium: 3.6 mmol/L (ref 3.5–5.1)
Sodium: 138 mmol/L (ref 135–145)
TCO2: 23 mmol/L (ref 22–32)

## 2022-01-23 LAB — PROTIME-INR
INR: 1 (ref 0.8–1.2)
Prothrombin Time: 13 seconds (ref 11.4–15.2)

## 2022-01-23 LAB — APTT: aPTT: 23 seconds — ABNORMAL LOW (ref 24–36)

## 2022-01-23 MED ORDER — SODIUM CHLORIDE 0.9 % IV BOLUS
500.0000 mL | Freq: Once | INTRAVENOUS | Status: AC
  Administered 2022-01-23: 500 mL via INTRAVENOUS

## 2022-01-23 MED ORDER — SODIUM CHLORIDE 0.9 % IV SOLN
100.0000 mL/h | INTRAVENOUS | Status: DC
  Administered 2022-01-23: 100 mL/h via INTRAVENOUS

## 2022-01-23 NOTE — ED Triage Notes (Signed)
Pt states around 1130 today while shopping had a pulling sensation to right leg and the into right arm, denies at present. Had some mild dizziness and HA.  Hx of same about a month ago but not as intense as today.  Mild nausea

## 2022-01-23 NOTE — ED Notes (Signed)
Pt noted to walk to restroom and back w/o difficulty.

## 2022-01-23 NOTE — Discharge Instructions (Signed)
Follow-up with the neurologist as we discussed for further evaluation.  Return to the emergency room immediately if you have any recurrent symptoms.

## 2022-01-23 NOTE — ED Provider Notes (Signed)
[facility] EMERGENCY DEPARTMENT Provider Note   CSN: [id number] Arrival date & time: 01/23/22  1437     History  Chief Complaint  Patient presents with   Extremity Weakness   Headache    Victoria Cooper is a 66 y.o. female.   Extremity Weakness Associated symptoms include headaches.  Headache    Patient has a history of hypertension migraines, reflux, hyperlipidemia.  Patient states she had onset this afternoon at around 1130 where she felt a pulling sensation in her right leg.  She felt it going to her right arm.  She started to have mild dizziness.  Patient then developed a headache.  She went into a dark room as she does have history of migraines.  She had some mild nausea.  She called her doctor suggested she come to the emergency room to be evaluated.  Patient states her symptoms are improving.  She denies any weakness right now.  No difficulty walking.  No trouble with her speech or  Home Medications Prior to Admission medications   Medication Sig Start Date End Date Taking? Authorizing Provider  Blood Pressure Monitoring (OMRON 3 SERIES BP MONITOR) DEVI Use as directed 10/10/21     Cholecalciferol (VITAMIN D-3) 125 MCG (5000 UT) TABS Take 10,000 Units by mouth daily.    [provider]  estradiol (ESTRACE) 0.5 MG tablet Take 1 tablet (0.5 mg total) by mouth daily. 01/22/22     guaiFENesin-codeine (GUAIATUSSIN AC) 100-10 MG/5ML syrup Take 5 mLs by mouth 3 (three) times daily. Patient not taking: Reported on 07/03/2021 06/19/21     hydroxychloroquine (PLAQUENIL) 200 MG tablet Take by mouth daily. Patient not taking: Reported on 07/19/2021    [provider]  hydroxychloroquine (PLAQUENIL) 200 MG tablet Take 1 tablet by mouth 2 times daily with food or milk. 12/10/21     MELATONIN PO Take by mouth at bedtime. Patient not taking: Reported on 07/03/2021    [provider]  nirmatrelvir & ritonavir (PAXLOVID) 20 x 150 MG & 10 x '100MG'$  TBPK Take 3 tablets by  mouth twice daily. Patient not taking: Reported on 07/03/2021 06/04/21     NONFORMULARY OR COMPOUNDED ITEM Inject 5 mg into the muscle every 7 (seven) days. Testosterone cypionate in olive  oil (25 mg/mL).  Dispense 5 mL vial. Patient not taking: Reported on 07/03/2021 08/22/20   Doree Albee, MD  NP THYROID 120 MG tablet Take 1 tablet (120 mg total) by mouth daily before breakfast. Patient not taking: Reported on 07/03/2021 11/06/20 11/06/21  Doree Albee, MD  progesterone (PROMETRIUM) 200 MG capsule Take 1 capsule (200 mg total) by mouth every evening. 01/22/22     rizatriptan (MAXALT) 10 MG tablet Take 10 mg by mouth as needed. 07/03/18   [provider]  rosuvastatin (CRESTOR) 20 MG tablet Take 1 tablet by mouth daily 01/22/22     thyroid (ARMOUR) 90 MG tablet Take 90 mg by mouth daily. Patient not taking: Reported on 07/19/2021    [provider]  thyroid (NP THYROID) 120 MG tablet Take 1 tablet (120 mg total) by mouth daily on an empty stomach. Patient not taking: Reported on 07/03/2021 03/05/21     traZODone (DESYREL) 150 MG tablet Take 1 tablet by mouth once daily at bedtime 12/10/21     traZODone (DESYREL) 50 MG tablet Take 1-1.5 tablets (50-75 mg total) by mouth at bedtime as needed. Patient not taking: Reported on 07/03/2021 10/23/20     valsartan-hydrochlorothiazide (DIOVAN HCT)  160-12.5 MG per tablet Take 1 tablet by mouth daily. Patient not taking: Reported on 07/19/2021 05/05/13   Tanda Rockers, MD  valsartan-hydrochlorothiazide (DIOVAN-HCT) 160-12.5 MG tablet TAKE 1 TABLET BY MOUTH ONCE DAILY 08/26/19 08/25/20  Deland Pretty, MD  valsartan-hydrochlorothiazide (DIOVAN-HCT) 160-12.5 MG tablet TAKE 1 TABLET BY MOUTH ONCE DAILY 10/29/21         Allergies    Morphine and related and Sulfa antibiotics    Review of Systems   Review of Systems  Musculoskeletal:  Positive for extremity weakness.  Neurological:  Positive for headaches.    Physical Exam Updated Vital Signs BP  (!) 141/85   Pulse 80   Temp 99.1 F (37.3 C) (Oral)   Resp 11   Ht 1.575 m ('5\' 2"'$ )   Wt 72.6 kg   LMP 04/29/2001 (LMP Unknown)   SpO2 97%   BMI 29.26 kg/m  Physical Exam Vitals and nursing note reviewed.  Constitutional:      General: She is not in acute distress.    Appearance: She is well-developed.  HENT:     Head: Normocephalic and atraumatic.     Right Ear: External ear normal.     Left Ear: External ear normal.  Eyes:     General: No visual field deficit or scleral icterus.       Right eye: No discharge.        Left eye: No discharge.     Conjunctiva/sclera: Conjunctivae normal.  Neck:     Trachea: No tracheal deviation.  Cardiovascular:     Rate and Rhythm: Normal rate and regular rhythm.  Pulmonary:     Effort: Pulmonary effort is normal. No respiratory distress.     Breath sounds: Normal breath sounds. No stridor. No wheezing or rales.  Abdominal:     General: Bowel sounds are normal. There is no distension.     Palpations: Abdomen is soft.     Tenderness: There is no abdominal tenderness. There is no guarding or rebound.  Musculoskeletal:        General: No tenderness.     Cervical back: Neck supple.  Skin:    General: Skin is warm and dry.     Findings: No rash.  Neurological:     Mental Status: She is alert and oriented to person, place, and time.     Cranial Nerves: No cranial nerve deficit, dysarthria or facial asymmetry.     Sensory: No sensory deficit.     Motor: No abnormal muscle tone, seizure activity or pronator drift.     Coordination: Coordination normal.     Comments:  able to hold both legs off bed for 5 seconds, sensation intact in all extremities,  no left or right sided neglect, normal finger-nose exam bilaterally, no nystagmus noted   Psychiatric:        Mood and Affect: Mood normal.     ED Results / Procedures / Treatments   Labs (all labs ordered are listed, but only abnormal results are displayed) Labs Reviewed  APTT -  Abnormal; Notable for the following components:      Result Value   aPTT 23 (*)    All other components within normal limits  PROTIME-INR  CBC  DIFFERENTIAL  COMPREHENSIVE METABOLIC PANEL  I-STAT CHEM 8, ED    EKG EKG Interpretation  Date/Time:  Wednesday January 23 2022 15:42:21 EDT Ventricular Rate:  77 PR Interval:  162 QRS Duration: 96 QT Interval:  414 QTC Calculation: 469 R Axis:  20 Text Interpretation: Sinus rhythm Supraventricular bigeminy Low voltage, precordial leads No significant change since last tracing Confirmed by Dorie Rank [phone]) on 01/23/2022 4:00:09 PM  Radiology MR BRAIN WO CONTRAST  Result Date: 01/23/2022 CLINICAL DATA:  Neuro deficit, acute, stroke suspected. Pulling sensation in the right leg and right arm, dizziness, and headache. EXAM: MRI HEAD WITHOUT CONTRAST TECHNIQUE: Multiplanar, multiecho pulse sequences of the brain and surrounding structures were obtained without intravenous contrast. COMPARISON:  Head CT 01/23/2022 and MRI 11/27/2015 FINDINGS: Brain: There is no evidence of an acute infarct, intracranial hemorrhage, mass, midline shift, or extra-axial fluid collection. The ventricles and sulci are within normal limits for age. Small T2 hyperintensities in the cerebral white matter bilaterally are largely new from the prior MRI and are nonspecific but compatible with mild chronic small vessel ischemic disease. Small chronic bilateral cerebellar infarcts are unchanged. Vascular: Major intracranial vascular flow voids are preserved. Skull and upper cervical spine: Unremarkable bone marrow signal. Sinuses/Orbits: Unremarkable orbits. Mild mucosal thickening in the left maxillary sinus. Clear mastoid air cells. Other: None. IMPRESSION: 1. No acute intracranial abnormality. 2. Mild chronic small vessel ischemic disease. 3. Chronic cerebellar infarcts. Electronically Signed   By: Logan Bores M.D.   On: 01/23/2022 18:00   CT HEAD WO CONTRAST  Result  Date: 01/23/2022 CLINICAL DATA:  Acute neuro deficit. Headache and right-sided weakness. EXAM: CT HEAD WITHOUT CONTRAST TECHNIQUE: Contiguous axial images were obtained from the base of the skull through the vertex without intravenous contrast. RADIATION DOSE REDUCTION: This exam was performed according to the departmental dose-optimization program which includes automated exposure control, adjustment of the mA and/or kV according to patient size and/or use of iterative reconstruction technique. COMPARISON:  MRI head 11/27/2015 FINDINGS: Brain: No evidence of acute infarction, hemorrhage, hydrocephalus, extra-axial collection or mass lesion/mass effect. Small hypodensities in the superior cerebellum bilaterally compatible with chronic infarct, unchanged. Vascular: Negative for hyperdense vessel Skull: Negative Sinuses/Orbits: Paranasal sinuses clear.  Negative orbit Other: None IMPRESSION: No acute abnormality. Small chronic infarcts in the superior cerebellum bilaterally. Electronically Signed   By: Franchot Gallo M.D.   On: 01/23/2022 16:22    Procedures Procedures    Medications Ordered in ED Medications  sodium chloride 0.9 % bolus 500 mL (0 mLs Intravenous Stopped 01/23/22 1706)    Followed by  0.9 %  sodium chloride infusion (100 mL/hr Intravenous New Bag/Given 01/23/22 1544)    ED Course/ Medical Decision Making/ A&P Clinical Course as of 01/23/22 1826  Wed Jan 23, 2022  1803 CT HEAD WO CONTRAST Chronic cerebellar infarcts noted, no acute abnormalities noted on the MRI [JK]  1803 I-stat chem 8, ED Normal [JK]  1803 Comprehensive metabolic panel Normal [JK]  1803 CBC Normal [JK]    Clinical Course User Index [JK] Dorie Rank, MD                           Medical Decision Making Differential diagnosis includes but not limited to complex migraine, stroke, TIA, cerebral hemorrhage, tumor  Problems Addressed: Other migraine without status migrainosus, not intractable: acute illness or  injury that poses a threat to life or bodily functions  Amount and/or Complexity of Data Reviewed Labs: ordered. Decision-making details documented in ED Course. Radiology: ordered and independent interpretation performed. Decision-making details documented in ED Course.  Risk Prescription drug management.   Patient presented to the ED with complaints of sensation in her right arm and right leg.  This  was followed by a headache.  Patient's symptoms have subsequently resolved.  Did not appreciate any focal neurologic deficits on my exam.  Was concerned about the possibility of TIA or stroke.  CT and MRI performed.  No acute stroke noted.  However patient does have evidence of old chronic cerebellar infarcts.  Patient remains asymptomatic.  This point I favor complex migraine rather than stroke with the headache symptoms she was experiencing.  We discussed options of admission to the hospital for further stroke TIA work-up although at this point I have a lower suspicion.  Patient is comfortable with discharge and I will refer her for outpatient neurology evaluation.  Warning signs and precautions discussed patient.  Patient understands to return immediately if she has any recurrent symptoms.  Evaluation and diagnostic testing in the emergency department does not suggest an emergent condition requiring admission or immediate intervention beyond what has been performed at this time.  The patient is safe for discharge and has been instructed to return immediately for worsening symptoms, change in symptoms or any other concerns.         Final Clinical Impression(s) / ED Diagnoses Final diagnoses:  Other migraine without status migrainosus, not intractable    Rx / DC Orders ED Discharge Orders          Ordered    Ambulatory referral to Neurology       Comments: An appointment is requested in approximately: 1 week   01/23/22 Arne Cleveland, MD 01/23/22 [id number]

## 2022-01-23 NOTE — ED Notes (Signed)
Patient transported to MRI 

## 2022-01-23 NOTE — ED Notes (Signed)
Pt verbalized understanding of discharge instructions. Opportunity for questions provided.  

## 2022-01-26 ENCOUNTER — Encounter (HOSPITAL_COMMUNITY): Payer: Self-pay | Admitting: Pharmacist

## 2022-01-26 ENCOUNTER — Other Ambulatory Visit (HOSPITAL_COMMUNITY): Payer: Self-pay

## 2022-01-29 NOTE — Progress Notes (Unsigned)
Referring:  Dorie Rank, MD [place],  [place] [address]  PCP: Deland Pretty, MD  Neurology was asked to evaluate Victoria Cooper, a 66 year old female for a chief complaint of headaches.  Our recommendations of care will be communicated by shared medical record.    CC:  headaches  History provided from self, husband  HPI:  Medical co-morbidities: HLD, GERD  The patient presents for evaluation of headaches and weakness.  On 01/23/22 she was in a shop when she suddenly felt disoriented, then felt like her right leg was being pulled away from her. Right arm felt weak as well. This lasted for 3-4 minutes. She then developed a severe headache with photophobia, nausea, and vertigo. No phonophobia, numbness, or visual aura. She presented to the ED where MRI brain showed no acute process but did show bilateral remote cerebellar infarcts (present on MRI in 2017 as well). She was unaware that she had a stroke in the past. Headache lasted 2-3 days and resolved on its own. 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   She has a history of migraine with aura since college. She is prescribed Maxalt but rarely takes it. Usually takes Excedrin which helps take the edge off. Only has one migraine every 6 months. Has lower level headaches ~2 days per week.  She previously followed with neurology in 2017 for migraine with visual and sensory aura. She was prescribed Topamax for migraine prevention, but this caused side effects so she stopped it.  She also reports pain in her lower back which radiates down her right leg. Sometimes feels like it will give out from under her. Has intermittent numbness in this leg as well. She has a history of lumbar stenosis s/p L4-S1 fusion. Feels symptoms have gotten worse since her surgery. She tried SI injections and physical therapy last year, but symptoms have persisted.  Headache History: Onset:  college Triggers: weather changes Aura: visual aura, numbness Quality/Description: throbbing Associated Symptoms:  Photophobia: yes  Phonophobia: no  Nausea: yes Other symptoms: vertigo Worse with activity?: yes Duration of headaches: several hours  Migraine days per month: 1 Headache free days per month: 29  Current Treatment: Abortive Maxalt Excedrin  Preventative none  Prior Therapies                                 Topamax 75 mg QHS valsartan Maxalt 10 mg PRN  LABS: CBC    Component Value Date/Time   WBC 7.1 01/23/2022 1546   RBC 4.64 01/23/2022 1546   HGB 14.3 01/23/2022 1552   HCT 42.0 01/23/2022 1552   PLT 189 01/23/2022 1546   MCV 92.9 01/23/2022 1546   MCH 31.3 01/23/2022 1546   MCHC 33.6 01/23/2022 1546   RDW 12.6 01/23/2022 1546   LYMPHSABS 3.1 01/23/2022 1546   MONOABS 0.6 01/23/2022 1546   EOSABS 0.1 01/23/2022 1546   BASOSABS 0.1 01/23/2022 1546      Latest Ref Rng & Units 01/23/2022    3:52 PM 01/23/2022    3:46 PM 05/09/2020    5:47 PM  CMP  Glucose 70 - 99 mg/dL 94  97  95   BUN 8 - 23 mg/dL '13  14  18   '$ Creatinine 0.44 - 1.00 mg/dL 0.60  0.64  0.69   Sodium 135 - 145 mmol/L 138  138  138   Potassium 3.5 - 5.1 mmol/L 3.6  3.5  3.7   Chloride 98 - 111 mmol/L 101  103  104   CO2 22 - 32 mmol/L  24  25   Calcium 8.9 - 10.3 mg/dL  9.4  9.3   Total Protein 6.5 - 8.1 g/dL  7.3  6.7   Total Bilirubin 0.3 - 1.2 mg/dL  0.7  0.2   Alkaline Phos 38 - 126 U/L  39    AST 15 - 41 U/L  26  18   ALT 0 - 44 U/L  30  23      IMAGING:  MRI brain 01/23/22: 1. No acute intracranial abnormality. 2. Mild chronic small vessel ischemic disease. 3. Chronic cerebellar infarcts.  Imaging independently reviewed on January 30, 2022   Current Outpatient Medications on File Prior to Visit  Medication Sig Dispense Refill   Cholecalciferol (VITAMIN D-3) 125 MCG (5000 UT) TABS Take 10,000 Units by mouth daily.     estradiol (ESTRACE) 0.5 MG tablet Take 1 tablet  (0.5 mg total) by mouth daily. 90 tablet 6   hydroxychloroquine (PLAQUENIL) 200 MG tablet Take 1 tablet by mouth 2 times daily with food or milk. 60 tablet 0   NONFORMULARY OR COMPOUNDED ITEM Inject 5 mg into the muscle every 7 (seven) days. Testosterone cypionate in olive  oil (25 mg/mL).  Dispense 5 mL vial. 1 each 3   progesterone (PROMETRIUM) 200 MG capsule Take 1 capsule (200 mg total) by mouth every evening. 90 capsule 1   rosuvastatin (CRESTOR) 20 MG tablet Take 1 tablet by mouth daily 90 tablet 3   traZODone (DESYREL) 150 MG tablet Take 1 tablet by mouth once daily at bedtime 90 tablet 3   valsartan-hydrochlorothiazide (DIOVAN-HCT) 160-12.5 MG tablet TAKE 1 TABLET BY MOUTH ONCE DAILY 90 tablet 3   thyroid (ARMOUR) 90 MG tablet Take 90 mg by mouth daily. (Patient not taking: Reported on 01/30/2022)     No current facility-administered medications on file prior to visit.     Allergies: Allergies  Allergen Reactions   Morphine And Related Itching   Sulfa Antibiotics Itching and Other (See Comments)    Itching in palms of hand and feet   Topamax [Topiramate]     Unknown to patient    Family History: Migraine or other headaches in the family:  mother Aneurysms in a first degree relative:  no Brain tumors in the family:  no Other neurological illness in the family:   no  Past Medical History: Past Medical History:  Diagnosis Date   Abnormal Pap smear of cervix 2006   Repeat Pap normal   Allergy    Animal bite 03/18/2011   Dog Bite   Arthritis    Asthma    Classical migraine with intractable migraine 11/30/2015   Connective tissue disease, undifferentiated ([place]) 2015   Dr. Amil Amen   Female hypogonadism syndrome 01/13/2019   GERD (gastroesophageal reflux disease)    Hyperlipidemia    Hypertension    Migraine    Vitamin D deficiency disease 01/13/2019   Wears glasses     Past Surgical History Past Surgical History:  Procedure Laterality Date   ABDOMINAL HYSTERECTOMY   2003   hyst   BACK SURGERY  2009   lumb lam-fusion   Basil cell     BREAST REDUCTION SURGERY  05/26/2012   Procedure: MAMMARY REDUCTION  (BREAST);  Surgeon: Erline Hau, MD;  Location: [facility];  Service: Plastics;  Laterality: Bilateral;   COLONOSCOPY  2003, 2018   diverticulosis    Social History: Social History   Tobacco Use   Smoking status: Never   Smokeless tobacco: Never  Vaping Use   Vaping Use: Never used  Substance Use Topics   Alcohol use: Yes    Alcohol/week: 0.0 - 2.0 standard drinks of alcohol   Drug use: No    ROS: Negative for fevers, chills. Positive for headaches, weakness, vertigo. All other systems reviewed and negative unless stated otherwise in HPI.   Physical Exam:   Vital Signs: BP 123/71   Pulse 85   Ht '5\' 2"'$  (1.575 m)   Wt 163 lb 6.4 oz (74.1 kg)   LMP 04/29/2001 (LMP Unknown)   BMI 29.89 kg/m  GENERAL: well appearing,in no acute distress,alert SKIN:  Color, texture, turgor normal. No rashes or lesions HEAD:  Normocephalic/atraumatic. CV:  RRR RESP: Normal respiratory effort MSK: no tenderness to palpation over occiput, neck, or shoulders  NEUROLOGICAL: Mental Status: Alert, oriented to person, place and time,Follows commands Cranial Nerves: PERRL, visual fields intact to confrontation, extraocular movements intact, facial sensation intact, no facial droop or ptosis, hearing grossly intact, no dysarthria Motor: muscle strength 5/5 both upper and lower extremities,no drift, normal tone Reflexes: 2+ throughout Sensation: intact to light touch all 4 extremities Coordination: Finger-to- nose-finger intact bilaterally Gait: normal-based   IMPRESSION: 66 year old female with a history of HLD, GERD who presents for evaluation of an episode of right-sided weakness and vertigo associated with migraine headache. MRI brain showed no acute process but did reveal bilateral remote cerebellar infarcts (present in 2017 MRI as  well). TTE in 2020 was unremarkable other than mitral regurgitation. She is already taking ASA 81 mg and Crestor 20 mg. Will complete stroke workup with vessel imaging of the head and neck. Suspect her episode was a migraine, possibly a hemiplegic migraine given arm weakness. Advised her to avoid triptans given history of stroke. Will start Nurtec for migraine rescue.  She also reports worsening symptoms of lumbar radiculopathy since her L4-S1 fusion, which have not improved with injections or physical therapy. Will order MRI L-spine to assess for worsening spinal/foraminal stenosis.  PLAN: -MRI lumbar spine -CTA head/neck -Continue ASA 81 mg, Crestor 20 mg daily for stroke prevention -Stop Maxalt. Start Nurtec 75 mg PRN for migraine rescue -Discussed supplement options for headache prevention including Mg, B2, and CoQ10   I spent a total of 37 minutes chart reviewing and counseling the patient. Headache education was done. Discussed treatment options including preventive and acute medications, and natural supplements. Discussed medication side effects, adverse reactions and drug interactions. Written educational materials and patient instructions outlining all of the above were given.  Follow-up: 6 months   Genia Harold, MD 01/30/2022   3:35 PM

## 2022-01-30 ENCOUNTER — Ambulatory Visit: Payer: 59 | Admitting: Psychiatry

## 2022-01-30 ENCOUNTER — Other Ambulatory Visit (HOSPITAL_COMMUNITY): Payer: Self-pay

## 2022-01-30 ENCOUNTER — Encounter: Payer: Self-pay | Admitting: Psychiatry

## 2022-01-30 VITALS — BP 123/71 | HR 85 | Ht 62.0 in | Wt 163.4 lb

## 2022-01-30 DIAGNOSIS — I639 Cerebral infarction, unspecified: Secondary | ICD-10-CM | POA: Diagnosis not present

## 2022-01-30 DIAGNOSIS — G43109 Migraine with aura, not intractable, without status migrainosus: Secondary | ICD-10-CM

## 2022-01-30 DIAGNOSIS — M5416 Radiculopathy, lumbar region: Secondary | ICD-10-CM | POA: Diagnosis not present

## 2022-01-30 MED ORDER — NURTEC 75 MG PO TBDP
75.0000 mg | ORAL_TABLET | ORAL | 6 refills | Status: DC | PRN
  Filled 2022-01-30: qty 8, 30d supply, fill #0
  Filled 2022-09-03: qty 8, 30d supply, fill #1

## 2022-01-30 NOTE — Patient Instructions (Addendum)
Start Nurtec as needed for migraines. Please avoid taking triptans such as Maxalt  CTA of the head and neck  MRI of the lumbar spine  Natural supplements that can reduce migraines: Magnesium Oxide or Magnesium Glycinate 500 mg at bed (up to 800 mg daily) Coenzyme Q10 300 mg in AM Vitamin B2- 200 mg twice a day  Add 1 supplement at a time since even natural supplements can have undesirable side effects. You can sometimes buy supplements cheaper (especially Coenzyme Q10) at [email or web address]-[email or web address] or at [employer].  Magnesium: Magnesium (250 mg twice a day or 500 mg at bed) has a relaxant effect on smooth muscles such as blood vessels. Individuals suffering from frequent or daily headache usually have low magnesium levels which can be increase with daily supplementation of 400-800 mg. Three trials found 40-90% average headache reduction  when used as a preventative. Magnesium also demonstrated the benefit in menstrually related migraine.  Magnesium is part of the messenger system in the serotonin cascade and it is a good muscle relaxant.  It is also useful for constipation. Good sources include nuts, whole grains, and tomatoes. Side Effects: loose stool/diarrhea  Riboflavin (vitamin B 2) 200 mg twice a day. This vitamin assists nerve cells in the production of ATP a principal energy storing molecule.  It is necessary for many chemical reactions in the body.  There have been at least 3 clinical trials of riboflavin using 400 mg per day all of which suggested that migraine frequency can be decreased.  All 3 trials showed significant improvement in over half of migraine sufferers.  The supplement is found in bread, cereal, milk, meat, and poultry.  Most Americans get more riboflavin than the recommended daily allowance, however riboflavin deficiency is not necessary for the supplements to help prevent headache. Side effects: energizing, green urine  Coenzyme Q10: This is present in almost all cells in the body and  is critical component for the conversion of energy.  Recent studies have shown that a nutritional supplement of CoQ10 can reduce the frequency of migraine attacks by improving the energy production of cells as with riboflavin.  Doses of 150 mg twice a day have been shown to be effective.

## 2022-01-31 ENCOUNTER — Telehealth: Payer: Self-pay | Admitting: *Deleted

## 2022-01-31 ENCOUNTER — Other Ambulatory Visit (HOSPITAL_COMMUNITY): Payer: Self-pay

## 2022-01-31 ENCOUNTER — Encounter: Payer: Self-pay | Admitting: *Deleted

## 2022-01-31 ENCOUNTER — Telehealth: Payer: Self-pay | Admitting: Psychiatry

## 2022-01-31 NOTE — Telephone Encounter (Signed)
UMR NPR sent to [facility]

## 2022-01-31 NOTE — Telephone Encounter (Signed)
Nurtec The authorization is effective for a maximum of 6 fill(s) from 01/31/2022 to 08/01/2022, as long as you are enrolled as a member of your current health plan.

## 2022-01-31 NOTE — Telephone Encounter (Signed)
Nurtec PA, Key: [id number], can't take triptans due to history of stroke. Your information has been sent to [facility].

## 2022-02-04 ENCOUNTER — Other Ambulatory Visit (HOSPITAL_COMMUNITY): Payer: Self-pay

## 2022-02-13 ENCOUNTER — Ambulatory Visit (HOSPITAL_COMMUNITY)
Admission: RE | Admit: 2022-02-13 | Discharge: 2022-02-13 | Disposition: A | Discharge status: Home / Self Care | Payer: 59 | Source: Ambulatory Visit | Attending: Psychiatry | Admitting: Psychiatry

## 2022-02-13 DIAGNOSIS — R519 Headache, unspecified: Secondary | ICD-10-CM | POA: Diagnosis not present

## 2022-02-13 DIAGNOSIS — I639 Cerebral infarction, unspecified: Secondary | ICD-10-CM | POA: Insufficient documentation

## 2022-02-13 MED ORDER — IOHEXOL 350 MG/ML SOLN
75.0000 mL | Freq: Once | INTRAVENOUS | Status: AC | PRN
  Administered 2022-02-13: 75 mL via INTRAVENOUS

## 2022-02-22 ENCOUNTER — Ambulatory Visit (HOSPITAL_COMMUNITY)
Admission: RE | Admit: 2022-02-22 | Discharge: 2022-02-22 | Disposition: A | Discharge status: Home / Self Care | Payer: 59 | Source: Ambulatory Visit | Attending: Psychiatry | Admitting: Psychiatry

## 2022-02-22 DIAGNOSIS — M5126 Other intervertebral disc displacement, lumbar region: Secondary | ICD-10-CM | POA: Diagnosis not present

## 2022-02-22 DIAGNOSIS — M47816 Spondylosis without myelopathy or radiculopathy, lumbar region: Secondary | ICD-10-CM | POA: Diagnosis not present

## 2022-02-22 DIAGNOSIS — M48061 Spinal stenosis, lumbar region without neurogenic claudication: Secondary | ICD-10-CM | POA: Diagnosis not present

## 2022-02-22 DIAGNOSIS — M5416 Radiculopathy, lumbar region: Secondary | ICD-10-CM | POA: Diagnosis not present

## 2022-02-28 ENCOUNTER — Encounter: Payer: Self-pay | Admitting: Psychiatry

## 2022-02-28 DIAGNOSIS — M545 Low back pain, unspecified: Secondary | ICD-10-CM

## 2022-02-28 DIAGNOSIS — M48061 Spinal stenosis, lumbar region without neurogenic claudication: Secondary | ICD-10-CM

## 2022-03-05 ENCOUNTER — Other Ambulatory Visit (HOSPITAL_COMMUNITY): Payer: Self-pay

## 2022-03-14 ENCOUNTER — Telehealth: Payer: Self-pay | Admitting: Psychiatry

## 2022-03-14 DIAGNOSIS — H524 Presbyopia: Secondary | ICD-10-CM | POA: Diagnosis not present

## 2022-03-14 DIAGNOSIS — Z79899 Other long term (current) drug therapy: Secondary | ICD-10-CM | POA: Diagnosis not present

## 2022-03-14 NOTE — Telephone Encounter (Signed)
Referral for Physical Therapy sent through [facility]

## 2022-03-14 NOTE — Telephone Encounter (Signed)
Referral for neurosurgery fax to [facility]. Phone: [phone], Fax: [phone]

## 2022-03-26 ENCOUNTER — Other Ambulatory Visit (HOSPITAL_COMMUNITY): Payer: Self-pay

## 2022-03-26 MED ORDER — HYDROXYCHLOROQUINE SULFATE 200 MG PO TABS
400.0000 mg | ORAL_TABLET | Freq: Every day | ORAL | 0 refills | Status: DC
  Filled 2022-03-26 – 2022-06-17 (×2): qty 180, 90d supply, fill #0

## 2022-03-26 MED ORDER — HYDROXYCHLOROQUINE SULFATE 200 MG PO TABS
200.0000 mg | ORAL_TABLET | Freq: Two times a day (BID) | ORAL | 1 refills | Status: DC
  Filled 2022-03-26: qty 60, 30d supply, fill #0
  Filled 2022-04-23: qty 60, 30d supply, fill #1

## 2022-03-29 ENCOUNTER — Other Ambulatory Visit (HOSPITAL_COMMUNITY): Payer: Self-pay

## 2022-03-29 DIAGNOSIS — M48062 Spinal stenosis, lumbar region with neurogenic claudication: Secondary | ICD-10-CM | POA: Diagnosis not present

## 2022-04-01 ENCOUNTER — Other Ambulatory Visit (HOSPITAL_COMMUNITY): Payer: Self-pay

## 2022-04-01 MED ORDER — GABAPENTIN 300 MG PO CAPS
300.0000 mg | ORAL_CAPSULE | Freq: Three times a day (TID) | ORAL | 3 refills | Status: DC
  Filled 2022-04-01: qty 60, 20d supply, fill #0
  Filled 2022-04-23: qty 60, 20d supply, fill #1
  Filled 2022-06-17: qty 60, 20d supply, fill #2

## 2022-04-01 MED ORDER — MELOXICAM 15 MG PO TABS
15.0000 mg | ORAL_TABLET | Freq: Every day | ORAL | 0 refills | Status: DC
  Filled 2022-04-01: qty 40, 40d supply, fill #0

## 2022-04-08 DIAGNOSIS — Z0100 Encounter for examination of eyes and vision without abnormal findings: Secondary | ICD-10-CM | POA: Diagnosis not present

## 2022-04-09 ENCOUNTER — Other Ambulatory Visit (HOSPITAL_COMMUNITY): Payer: Self-pay | Admitting: Neurosurgery

## 2022-04-09 DIAGNOSIS — M48062 Spinal stenosis, lumbar region with neurogenic claudication: Secondary | ICD-10-CM

## 2022-04-23 ENCOUNTER — Other Ambulatory Visit: Payer: Self-pay

## 2022-04-23 ENCOUNTER — Other Ambulatory Visit (HOSPITAL_COMMUNITY): Payer: Self-pay

## 2022-04-23 MED ORDER — MELOXICAM 15 MG PO TABS
15.0000 mg | ORAL_TABLET | Freq: Every day | ORAL | 0 refills | Status: DC
  Filled 2022-04-23 – 2022-05-22 (×2): qty 40, 40d supply, fill #0

## 2022-04-30 ENCOUNTER — Ambulatory Visit (HOSPITAL_COMMUNITY)
Admission: RE | Admit: 2022-04-30 | Discharge: 2022-04-30 | Disposition: A | Discharge status: Home / Self Care | Payer: Medicare HMO | Source: Ambulatory Visit | Attending: Neurosurgery | Admitting: Neurosurgery

## 2022-04-30 DIAGNOSIS — M545 Low back pain, unspecified: Secondary | ICD-10-CM | POA: Diagnosis not present

## 2022-04-30 DIAGNOSIS — M48062 Spinal stenosis, lumbar region with neurogenic claudication: Secondary | ICD-10-CM | POA: Insufficient documentation

## 2022-05-02 ENCOUNTER — Ambulatory Visit (HOSPITAL_COMMUNITY): Payer: Medicare HMO | Attending: Internal Medicine | Admitting: Physical Therapy

## 2022-05-02 DIAGNOSIS — M5459 Other low back pain: Secondary | ICD-10-CM | POA: Diagnosis not present

## 2022-05-02 NOTE — Therapy (Signed)
OUTPATIENT PHYSICAL THERAPY THORACOLUMBAR EVALUATION   Patient Name: Victoria Cooper MRN: [id number] DOB:[birth year], 67 y.o., female Today's Date: 05/02/2022  END OF SESSION:  PT End of Session - 05/02/22 0951     Visit Number 1    Number of Visits 12    Date for PT Re-Evaluation 06/13/22    Authorization Type [employer] Medicare    Progress Note Due on Visit 10    PT Start Time 0906    PT Stop Time 0950    PT Time Calculation (min) 44 min    Activity Tolerance Patient tolerated treatment well    Behavior During Therapy [facility] for tasks assessed/performed             Past Medical History:  Diagnosis Date   Abnormal Pap smear of cervix 2006   Repeat Pap normal   Allergy    Animal bite 03/18/2011   Dog Bite   Arthritis    Asthma    Classical migraine with intractable migraine 11/30/2015   Connective tissue disease, undifferentiated ([place]) 2015   Dr. Amil Amen   Female hypogonadism syndrome 01/13/2019   GERD (gastroesophageal reflux disease)    Hyperlipidemia    Hypertension    Migraine    Vitamin D deficiency disease 01/13/2019   Wears glasses    Past Surgical History:  Procedure Laterality Date   ABDOMINAL HYSTERECTOMY  2003   hyst   BACK SURGERY  2009   lumb lam-fusion   Basil cell     BREAST REDUCTION SURGERY  05/26/2012   Procedure: MAMMARY REDUCTION  (BREAST);  Surgeon: Erline Hau, MD;  Location: [facility];  Service: Plastics;  Laterality: Bilateral;   COLONOSCOPY  2003, 2018   diverticulosis   Patient Active Problem List   Diagnosis Date Noted   Female hypogonadism syndrome 01/13/2019   Vitamin D deficiency disease 01/13/2019   Classical migraine with intractable migraine 11/30/2015   Upper airway cough syndrome 04/07/2013   Essential hypertension, benign 04/07/2013   Extrinsic asthma, unspecified 03/05/2013   Animal bite 03/18/2011    PCP: Deland Pretty MD  REFERRING PROVIDER: Vallarie Mare, MD  REFERRING DIAG: PT eval/tx for  [phone] lumbar stenosis w/neurogenic claudication per Duffy Rhody, MD  Rationale for Evaluation and Treatment: Rehabilitation  THERAPY DIAG:  Other low back pain  ONSET DATE: April 2022   SUBJECTIVE:                                                                                                                                                                                           SUBJECTIVE STATEMENT: Patient presents to therapy with complaint of  LBP. This began insidiously about 18 months ago. She reports history of lumbar fusion 16 years ago. She presents with ongoing back pain. She states sometimes her RT leg feels like a rubber band is pulling on it and has decreased control of RT leg. She would like to strengthen core and get in better standing for any potential back surgeries she may need in the future. She is currently taking mobic and gabapentin for this issue .  PERTINENT HISTORY:  Lumbar fusion 2007, HTN, non differentiated connective tissue disorder   PAIN:  Are you having pain? Yes: NPRS scale: 0/10 at rest 7-8/10 Pain location: low back, bilat thighs  Pain description: sharp, dull, aching  Aggravating factors: lumbar extension, lifting, carrying, prolonged standing   Relieving factors: laying, sitting, meds   PRECAUTIONS: None  WEIGHT BEARING RESTRICTIONS: No  FALLS:  Has patient fallen in last 6 months? 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 At L3-4 there is a mild broad-based disc bulge. Severe bilateral facet arthropathy. Severe spinal stenosis. Moderate right and mild left  foraminal stenosis. 2. Posterior lumbar interbody fusion at L4-5 and L5-S1 without foraminal or central canal stenosis. 3. No acute osseous injury of the lumbar spine.  IMPRESSION: 1. L4-S1 PLIF with widely patent spinal canal and neural foramina. Solid arthrodesis. 2. Mild L3-4 degenerative disc disease and facet arthrosis without stenosis.   Aortic Atherosclerosis (ICD10-I70.0).  PATIENT SURVEYS:  FOTO 51% function   COGNITION: Overall cognitive status: Within functional limits for tasks assessed     SENSATION: WFL  PALPATION: Min/ mod TTP about RT upper glute med/ piriformis   LUMBAR ROM:   AROM eval  Flexion WFL  Extension 75% limited  Right lateral flexion WFL  Left lateral flexion WFL  Right rotation   Left rotation    (Blank rows = not tested)  LOWER EXTREMITY ROM:     AROM WFL   LOWER EXTREMITY MMT:    MMT Right eval Left eval  Hip flexion 5 5  Hip extension 4 4-  Hip abduction 4 4  Hip adduction    Hip internal rotation    Hip external rotation    Knee flexion    Knee extension 5 4+  Ankle dorsiflexion 5 5  Ankle plantarflexion    Ankle inversion    Ankle eversion     (Blank rows = not tested)  LUMBAR SPECIAL TESTS:  (-) sacral compression, (-) SLR    TODAY'S TREATMENT:  DATE:  05/02/22 Eval     PATIENT EDUCATION:  Education details: on Eval findings, POC and HEP  Person educated: Patient Education method: Explanation Education comprehension: verbalized understanding  HOME EXERCISE PROGRAM: Access Code: [email or web address] URL: [email or web address] Date: 05/02/2022 Prepared by: Josue Hector  Exercises - Supine Transversus Abdominis Bracing - Hands on Stomach  - 2 x daily - 7 x weekly - 1-2 sets - 10 reps - 5 second hold - Small Range Straight Leg Raise  - 2 x daily - 7 x weekly - 2 sets - 10  reps  ASSESSMENT:  CLINICAL IMPRESSION: Patient is a 67 y.o. female who presents to physical therapy with complaint of LBP. Patient demonstrates muscle weakness, reduced ROM, and fascial restrictions which are likely contributing to symptoms of pain and are negatively impacting patient ability to perform ADLs and functional mobility tasks. Patient will benefit from skilled physical therapy services to address these deficits to reduce pain and improve level of function with ADLs and functional mobility tasks.   OBJECTIVE IMPAIRMENTS: decreased activity tolerance, decreased mobility, difficulty walking, decreased ROM, decreased strength, increased fascial restrictions, impaired flexibility, improper body mechanics, and pain.   ACTIVITY LIMITATIONS: carrying, lifting, bending, sitting, standing, squatting, stairs, transfers, bed mobility, and locomotion level  PARTICIPATION LIMITATIONS: meal prep, cleaning, laundry, driving, shopping, community activity, occupation, and yard work  PERSONAL FACTORS: Time since onset of injury/illness/exacerbation are also affecting patient's functional outcome.   REHAB POTENTIAL: Good  CLINICAL DECISION MAKING: Stable/uncomplicated  EVALUATION COMPLEXITY: Low   GOALS: SHORT TERM GOALS: Target date: 05/23/2022  Patient will be independent with initial HEP and self-management strategies to improve functional outcomes Baseline:  Goal status: INITIAL    LONG TERM GOALS: Target date: 06/13/2022  Patient will be independent with advanced HEP and self-management strategies to improve functional outcomes Baseline:  Goal status: INITIAL  2.  Patient will improve FOTO score to predicted value to indicate improvement in functional outcomes Baseline: 51% function Goal status: INITIAL  3.  Patient will report reduction of back pain to <3/10 with activity for improved quality of life and ability to perform ADLs  Baseline: 7-8/10 Goal status: INITIAL  4.  Patient will have equal to or > 4+/5 MMT throughout BLE to improve ability to perform functional mobility, stair ambulation and ADLs.  Baseline: See MMT  Goal status: INITIAL PLAN:  PT FREQUENCY: 1-2x/week  PT DURATION: 6 weeks  PLANNED INTERVENTIONS: Therapeutic exercises, Therapeutic activity, Neuromuscular re-education, Balance training, Gait training, Patient/Family education, Joint manipulation, Joint mobilization, Stair training, Aquatic Therapy, Dry Needling, Electrical stimulation, Spinal manipulation, Spinal mobilization, Cryotherapy, Moist heat, scar mobilization, Taping, Traction, Ultrasound, Biofeedback, Ionotophoresis '4mg'$ /ml Dexamethasone, and Manual therapy. Marland Kitchen  PLAN FOR NEXT SESSION: Progress glute and core strengthening as tolerated. Weekly HEP updates.   9:52 AM, 05/02/22 Josue Hector PT DPT  Physical Therapist with [facility]  [phone]

## 2022-05-07 ENCOUNTER — Encounter (HOSPITAL_COMMUNITY): Payer: Medicare HMO | Admitting: Physical Therapy

## 2022-05-10 ENCOUNTER — Encounter (HOSPITAL_COMMUNITY): Payer: Medicare HMO | Admitting: Physical Therapy

## 2022-05-10 DIAGNOSIS — M48062 Spinal stenosis, lumbar region with neurogenic claudication: Secondary | ICD-10-CM | POA: Diagnosis not present

## 2022-05-15 ENCOUNTER — Ambulatory Visit (HOSPITAL_COMMUNITY): Payer: Medicare HMO | Admitting: Physical Therapy

## 2022-05-15 DIAGNOSIS — M5459 Other low back pain: Secondary | ICD-10-CM | POA: Diagnosis not present

## 2022-05-15 NOTE — Therapy (Signed)
OUTPATIENT PHYSICAL THERAPY TREATMENT   Patient Name: Victoria Cooper MRN: [id number] DOB:[birth year], 67 y.o., female Today's Date: 05/15/2022  END OF SESSION:  PT End of Session - 05/15/22 1520     Visit Number 2    Number of Visits 12    Date for PT Re-Evaluation 06/13/22    Authorization Type [employer] Medicare    Progress Note Due on Visit 10    PT Start Time 1520    PT Stop Time 1600    PT Time Calculation (min) 40 min    Activity Tolerance Patient tolerated treatment well    Behavior During Therapy [facility] for tasks assessed/performed             Past Medical History:  Diagnosis Date   Abnormal Pap smear of cervix 2006   Repeat Pap normal   Allergy    Animal bite 03/18/2011   Dog Bite   Arthritis    Asthma    Classical migraine with intractable migraine 11/30/2015   Connective tissue disease, undifferentiated ([place]) 2015   Dr. Amil Amen   Female hypogonadism syndrome 01/13/2019   GERD (gastroesophageal reflux disease)    Hyperlipidemia    Hypertension    Migraine    Vitamin D deficiency disease 01/13/2019   Wears glasses    Past Surgical History:  Procedure Laterality Date   ABDOMINAL HYSTERECTOMY  2003   hyst   BACK SURGERY  2009   lumb lam-fusion   Basil cell     BREAST REDUCTION SURGERY  05/26/2012   Procedure: MAMMARY REDUCTION  (BREAST);  Surgeon: Erline Hau, MD;  Location: [place];  Service: Plastics;  Laterality: Bilateral;   COLONOSCOPY  2003, 2018   diverticulosis   Patient Active Problem List   Diagnosis Date Noted   Female hypogonadism syndrome 01/13/2019   Vitamin D deficiency disease 01/13/2019   Classical migraine with intractable migraine 11/30/2015   Upper airway cough syndrome 04/07/2013   Essential hypertension, benign 04/07/2013   Extrinsic asthma, unspecified 03/05/2013   Animal bite 03/18/2011    PCP: Deland Pretty MD  REFERRING PROVIDER: Vallarie Mare, MD  REFERRING DIAG: PT eval/tx for [phone]  lumbar stenosis w/neurogenic claudication per Duffy Rhody, MD  Rationale for Evaluation and Treatment: Rehabilitation  THERAPY DIAG:  Other low back pain  ONSET DATE: April 2022   SUBJECTIVE:                                                                                                                                                                                           SUBJECTIVE STATEMENT: Pt states she is having her normal pain  today, at rest not hurting but with certain movements increases to 7/10.  Evaluation:  Patient presents to therapy with complaint of LBP. This began insidiously about 18 months ago. She reports history of lumbar fusion 16 years ago. She presents with ongoing back pain. She states sometimes her RT leg feels like a rubber band is pulling on it and has decreased control of RT leg. She would like to strengthen core and get in better standing for any potential back surgeries she may need in the future. She is currently taking mobic and gabapentin for this issue .  PERTINENT HISTORY:  Lumbar fusion 2007, HTN, non differentiated connective tissue disorder   PAIN:  Are you having pain? Yes: NPRS scale: 0/10 at rest 7-8/10 Pain location: low back, bilat thighs  Pain description: sharp, dull, aching  Aggravating factors: lumbar extension, lifting, carrying, prolonged standing   Relieving factors: laying, sitting, meds   PRECAUTIONS: None  WEIGHT BEARING RESTRICTIONS: No  FALLS:  Has patient fallen in last 6 months? 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 At L3-4 there is a  mild broad-based disc bulge. Severe bilateral facet arthropathy. Severe spinal stenosis. Moderate right and mild left foraminal stenosis. 2. Posterior lumbar interbody fusion at L4-5 and L5-S1 without foraminal or central canal stenosis. 3. No acute osseous injury of the lumbar spine.  IMPRESSION: 1. L4-S1 PLIF with widely patent spinal canal and neural foramina. Solid arthrodesis. 2. Mild L3-4 degenerative disc disease and facet arthrosis without stenosis.   Aortic Atherosclerosis (ICD10-I70.0).  PATIENT SURVEYS:  FOTO 51% function   COGNITION: Overall cognitive status: Within functional limits for tasks assessed     SENSATION: WFL  PALPATION: Min/ mod TTP about RT upper glute med/ piriformis   LUMBAR ROM:   AROM eval  Flexion WFL  Extension 75% limited  Right lateral flexion WFL  Left lateral flexion WFL  Right rotation   Left rotation    (Blank rows = not tested)  LOWER EXTREMITY ROM:     AROM WFL   LOWER EXTREMITY MMT:    MMT Right eval Left eval  Hip flexion 5 5  Hip extension 4 4-  Hip abduction 4 4  Hip adduction    Hip internal rotation    Hip external rotation    Knee flexion    Knee extension 5 4+  Ankle dorsiflexion 5 5  Ankle plantarflexion    Ankle inversion    Ankle eversion     (Blank rows = not tested)  LUMBAR SPECIAL TESTS:  (-) sacral compression, (-) SLR    TODAY'S TREATMENT:  DATE:  05/15/22 Supine:  ab sets 10X5"  Bridge 10X  SLR 10X each  Hamstring stretch 5X30" each with UE assist Sidelying: hip abduction 2X10 each  Clams 10X5: each Prone:  hip extensions 10X  POE 2X1 minute  05/02/22 Eval     PATIENT EDUCATION:  Education details: on Eval findings, POC and HEP  Person educated: Patient Education method: Explanation Education comprehension: verbalized understanding  HOME EXERCISE  PROGRAM: Access Code: [email or web address] URL: [email or web address] Date: 05/02/2022 Prepared by: Josue Hector Exercises - Supine Transversus Abdominis Bracing - Hands on Stomach  - 2 x daily - 7 x weekly - 1-2 sets - 10 reps - 5 second hold - Small Range Straight Leg Raise  - 2 x daily - 7 x weekly - 2 sets - 10 reps  Access Code: [email or web address] URL: [email or web address] Date: 05/15/2022 Prepared by: Roseanne Reno Exercises -- Hooklying Active Hamstring Stretch  - 2 x daily - 7 x weekly - 1 sets - 5 reps - 30 sec hold - Beginner Bridge  - 2 x daily - 7 x weekly - 2 sets - 10 reps - Sidelying Clamshell in Neutral  - 2 x daily - 7 x weekly - 1 sets - 10 reps - Sidelying Hip Abduction  - 2 x daily - 7 x weekly - 2 sets - 10 reps - Prone Hip Extension  - 2 x daily - 7 x weekly - 2 sets - 10 reps - Prone Press Up On Elbows  - 2 x daily - 7 x weekly - 1 sets - 5 reps - 1 minute hold  ASSESSMENT:  CLINICAL IMPRESSION: Reviewed HEP and POC moving forward. Pt with questions regarding breathing technique with therex.  Progressed with exercises to benefit weaker mm groups tested at evaluation. Pt with good form overall and these were added to established HEP.  No complaints or issues during or at conclusion of session  Patient will continue to benefit from skilled physical therapy services to address deficits, reduce pain and improve level of function with ADLs and functional mobility tasks.   OBJECTIVE IMPAIRMENTS: decreased activity tolerance, decreased mobility, difficulty walking, decreased ROM, decreased strength, increased fascial restrictions, impaired flexibility, improper body mechanics, and pain.   ACTIVITY LIMITATIONS: carrying, lifting, bending, sitting, standing, squatting, stairs, transfers, bed mobility, and locomotion level  PARTICIPATION LIMITATIONS: meal prep, cleaning, laundry, driving, shopping, community activity, occupation, and yard work  PERSONAL FACTORS:  Time since onset of injury/illness/exacerbation are also affecting patient's functional outcome.   REHAB POTENTIAL: Good  CLINICAL DECISION MAKING: Stable/uncomplicated  EVALUATION COMPLEXITY: Low   GOALS: SHORT TERM GOALS: Target date: 05/23/2022  Patient will be independent with initial HEP and self-management strategies to improve functional outcomes Baseline:  Goal status: IN PROGRESS    LONG TERM GOALS: Target date: 06/13/2022  Patient will be independent with advanced HEP and self-management strategies to improve functional outcomes Baseline:  Goal status: IN PROGRESS  2.  Patient will improve FOTO score to predicted value to indicate improvement in functional outcomes Baseline: 51% function Goal status: IN PROGRESS  3.  Patient will report reduction of back pain to <3/10 with activity for improved quality of life and ability to perform ADLs  Baseline: 7-8/10 Goal status: IN PROGRESS  4. Patient will have equal to or > 4+/5 MMT throughout BLE to improve ability to perform functional mobility, stair ambulation and ADLs.  Baseline: See MMT  Goal status: IN PROGRESS PLAN:  PT FREQUENCY: 1-2x/week  PT DURATION: 6 weeks  PLANNED INTERVENTIONS: Therapeutic exercises, Therapeutic activity, Neuromuscular re-education, Balance training, Gait training, Patient/Family education, Joint manipulation, Joint mobilization, Stair training, Aquatic Therapy, Dry Needling, Electrical stimulation, Spinal manipulation, Spinal mobilization, Cryotherapy, Moist heat, scar mobilization, Taping, Traction, Ultrasound, Biofeedback, Ionotophoresis '4mg'$ /ml Dexamethasone, and Manual therapy. Marland Kitchen  PLAN FOR NEXT SESSION: Progress glute and core strengthening as tolerated. Weekly HEP updates.   4:20 PM, 05/15/22 Teena Irani, PTA/CLT [place] Ph: [phone]

## 2022-05-22 ENCOUNTER — Ambulatory Visit (HOSPITAL_COMMUNITY): Payer: Medicare HMO | Admitting: Physical Therapy

## 2022-05-22 ENCOUNTER — Other Ambulatory Visit: Payer: Self-pay

## 2022-05-22 DIAGNOSIS — M5459 Other low back pain: Secondary | ICD-10-CM

## 2022-05-22 NOTE — Therapy (Signed)
OUTPATIENT PHYSICAL THERAPY TREATMENT   Patient Name: Victoria Cooper MRN: [id number] DOB:[birth year], 67 y.o., female Today's Date: 05/22/2022  END OF SESSION:  PT End of Session - 05/22/22 1115     Visit Number 3    Number of Visits 12    Date for PT Re-Evaluation 06/13/22    Authorization Type [employer] Medicare    Progress Note Due on Visit 10    PT Start Time 1115    PT Stop Time 1155    PT Time Calculation (min) 40 min    Activity Tolerance Patient tolerated treatment well              Past Medical History:  Diagnosis Date   Abnormal Pap smear of cervix 2006   Repeat Pap normal   Allergy    Animal bite 03/18/2011   Dog Bite   Arthritis    Asthma    Classical migraine with intractable migraine 11/30/2015   Connective tissue disease, undifferentiated ([place]) 2015   Dr. Amil Amen   Female hypogonadism syndrome 01/13/2019   GERD (gastroesophageal reflux disease)    Hyperlipidemia    Hypertension    Migraine    Vitamin D deficiency disease 01/13/2019   Wears glasses    Past Surgical History:  Procedure Laterality Date   ABDOMINAL HYSTERECTOMY  2003   hyst   BACK SURGERY  2009   lumb lam-fusion   Basil cell     BREAST REDUCTION SURGERY  05/26/2012   Procedure: MAMMARY REDUCTION  (BREAST);  Surgeon: Erline Hau, MD;  Location: [place];  Service: Plastics;  Laterality: Bilateral;   COLONOSCOPY  2003, 2018   diverticulosis   Patient Active Problem List   Diagnosis Date Noted   Female hypogonadism syndrome 01/13/2019   Vitamin D deficiency disease 01/13/2019   Classical migraine with intractable migraine 11/30/2015   Upper airway cough syndrome 04/07/2013   Essential hypertension, benign 04/07/2013   Extrinsic asthma, unspecified 03/05/2013   Animal bite 03/18/2011    PCP: Deland Pretty MD  REFERRING PROVIDER: Vallarie Mare, MD  REFERRING DIAG: PT eval/tx for [phone] lumbar stenosis w/neurogenic claudication per Duffy Rhody,  MD  Rationale for Evaluation and Treatment: Rehabilitation  THERAPY DIAG:  Other low back pain  ONSET DATE: April 2022   SUBJECTIVE:                                                                                                                                                                                           SUBJECTIVE STATEMENT: Patient thinks that the exercises are helping her but reports that she's not very compliant with HEP.  Denies any pain at the moment. However, when pain hit, patient reports that she can feel it in front of her thighs.  Evaluation:  Patient presents to therapy with complaint of LBP. This began insidiously about 18 months ago. She reports history of lumbar fusion 16 years ago. She presents with ongoing back pain. She states sometimes her RT leg feels like a rubber band is pulling on it and has decreased control of RT leg. She would like to strengthen core and get in better standing for any potential back surgeries she may need in the future. She is currently taking mobic and gabapentin for this issue .  PERTINENT HISTORY:  Lumbar fusion 2007, HTN, non differentiated connective tissue disorder   PAIN:  Are you having pain? No and Yes: NPRS scale: 0/10 Pain location: low back, bilat thighs  Pain description: sharp, dull, aching  Aggravating factors: lumbar extension, lifting, carrying, prolonged standing   Relieving factors: laying, sitting, meds   PRECAUTIONS: None  WEIGHT BEARING RESTRICTIONS: No  FALLS:  Has patient fallen in last 6 months? 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 At L3-4 there is a mild broad-based disc bulge. Severe bilateral facet arthropathy. Severe spinal stenosis. Moderate right and mild left foraminal stenosis. 2. Posterior lumbar interbody fusion at L4-5 and L5-S1 without foraminal or central canal stenosis. 3. No acute osseous injury of the lumbar spine.  IMPRESSION: 1. L4-S1 PLIF with widely patent spinal canal and neural foramina. Solid arthrodesis. 2. Mild L3-4 degenerative disc disease and facet arthrosis without stenosis.   Aortic Atherosclerosis (ICD10-I70.0).  PATIENT SURVEYS:  FOTO 51% function   COGNITION: Overall cognitive status: Within functional limits for tasks assessed     SENSATION: WFL  PALPATION: Min/ mod TTP about RT upper glute med/ piriformis   LUMBAR ROM:   AROM eval  Flexion WFL  Extension 75% limited  Right lateral flexion WFL  Left lateral flexion WFL  Right rotation   Left rotation    (Blank rows = not tested)  LOWER EXTREMITY ROM:     AROM WFL   LOWER EXTREMITY MMT:    MMT Right eval Left eval  Hip flexion 5 5  Hip extension 4 4-  Hip abduction 4 4  Hip adduction    Hip internal rotation    Hip external rotation    Knee flexion    Knee extension 5 4+  Ankle dorsiflexion 5 5  Ankle plantarflexion    Ankle inversion    Ankle eversion     (Blank rows = not tested)  LUMBAR SPECIAL TESTS:  (-) sacral compression, (-) SLR    TODAY'S TREATMENT:  DATE:  05/22/22: Supine:   Post pelvic tilts x 6" x 2  x 10  Bridge 2x10X3" Seated:  Hamstring stretch 3X30"  Piriformis stretch 3x30"  Forward flex with swiss ball x 30" x 3   Abdominal isom with green swiss ball x 3" x 10 x 2  Marches in neutral spine x 10 x 2 Standing:  Abdominal isom with green swiss ball x 3" x 10 x 2  Hip abd x 10 x 2  Hip ext x 10 x 2  Pallof press, RTB x 10 x 2 on  each side  05/15/22 Supine:  ab sets 10X5"  Bridge 10X  SLR 10X each  Hamstring stretch 5X30" each with UE assist Sidelying: hip abduction 2X10 each  Clams 10X5: each Prone:  hip extensions 10X  POE 2X1 minute  05/02/22 Eval     PATIENT EDUCATION:  Education details: Updated HEP Person educated: Patient Education method: Explanation Education comprehension: verbalized understanding  HOME EXERCISE PROGRAM: Access Code: [email or web address] URL: [email or web address]  Date: 05/22/2022 - Seated Hamstring Stretch  - 1-2 x daily - 5-7 x weekly - 3 reps - 30 hold - Supine Posterior Pelvic Tilt  - 1-2 x daily - 5-7 x weekly - 2 sets - 10 reps - 6 hold - Seated Piriformis Stretch  - 1-2 x daily - 5-7 x weekly - 3 reps - 30 hold - Clamshell with Resistance  - 1-2 x daily - 5-7 x weekly - 2 sets - 10 reps - Standing Hip Abduction with Counter Support  - 1-2 x daily - 5-7 x weekly - 2 sets - 10 reps - Standing Hip Extension with Counter Support  - 1-2 x daily - 5-7 x weekly - 2 sets - 10 reps  Date: 05/02/2022 Prepared by: Josue Hector Exercises - Supine Transversus Abdominis Bracing - Hands on Stomach  - 2 x daily - 7 x weekly - 1-2 sets - 10 reps - 5 second hold - Small Range Straight Leg Raise  - 2 x daily - 7 x weekly - 2 sets - 10 reps  Access Code: [email or web address] URL: [email or web address] Date: 05/15/2022 Prepared by: Roseanne Reno Exercises -- Hooklying Active Hamstring Stretch  - 2 x daily - 7 x weekly - 1 sets - 5 reps - 30 sec hold - Beginner Bridge  - 2 x daily - 7 x weekly - 2 sets - 10 reps - Sidelying Clamshell in Neutral  - 2 x daily - 7 x weekly - 1 sets - 10 reps - Sidelying Hip Abduction  - 2 x daily - 7 x weekly - 2 sets - 10 reps - Prone Hip Extension  - 2 x daily - 7 x weekly - 2 sets - 10 reps - Prone Press Up On Elbows  - 2 x daily - 7 x weekly - 1 sets - 5 reps - 1 minute hold  ASSESSMENT:  CLINICAL IMPRESSION: Tolerated all activities  without worsening of symptoms. Demonstrated appropriate levels of fatigue. Required slight amount of cueing to ensure correct execution of activity. Patient also demonstrated mild unsteadiness when doing the hip bridges and Pallof press which indicates a weak core. Patient reported that the stretches "feel good".   OBJECTIVE IMPAIRMENTS: decreased activity tolerance, decreased mobility, difficulty walking, decreased ROM, decreased strength, increased fascial restrictions, impaired flexibility, improper body mechanics, and pain.   ACTIVITY LIMITATIONS: carrying, lifting, bending, sitting, standing, squatting, stairs, transfers, bed mobility, and locomotion level  PARTICIPATION LIMITATIONS: meal prep, cleaning, laundry, driving,  shopping, community activity, occupation, and yard work  PERSONAL FACTORS: Time since onset of injury/illness/exacerbation are also affecting patient's functional outcome.   REHAB POTENTIAL: Good  CLINICAL DECISION MAKING: Stable/uncomplicated  EVALUATION COMPLEXITY: Low   GOALS: SHORT TERM GOALS: Target date: 05/23/2022  Patient will be independent with initial HEP and self-management strategies to improve functional outcomes Baseline:  Goal status: IN PROGRESS    LONG TERM GOALS: Target date: 06/13/2022  Patient will be independent with advanced HEP and self-management strategies to improve functional outcomes Baseline:  Goal status: IN PROGRESS  2.  Patient will improve FOTO score to predicted value to indicate improvement in functional outcomes Baseline: 51% function Goal status: IN PROGRESS  3.  Patient will report reduction of back pain to <3/10 with activity for improved quality of life and ability to perform ADLs  Baseline: 7-8/10 Goal status: IN PROGRESS  4. Patient will have equal to or > 4+/5 MMT throughout BLE to improve ability to perform functional mobility, stair ambulation and ADLs.  Baseline: See MMT  Goal status: IN  PROGRESS PLAN:  PT FREQUENCY: 1-2x/week  PT DURATION: 6 weeks  PLANNED INTERVENTIONS: Therapeutic exercises, Therapeutic activity, Neuromuscular re-education, Balance training, Gait training, Patient/Family education, Joint manipulation, Joint mobilization, Stair training, Aquatic Therapy, Dry Needling, Electrical stimulation, Spinal manipulation, Spinal mobilization, Cryotherapy, Moist heat, scar mobilization, Taping, Traction, Ultrasound, Biofeedback, Ionotophoresis '4mg'$ /ml Dexamethasone, and Manual therapy. Marland Kitchen  PLAN FOR NEXT SESSION: Continue POC and may progress as tolerated working on flexibility and core and hip strength   11:21 AM, 05/22/22 Chrissie Noa L. Franco Duley, PT, DPT, OCS Board-Certified Clinical Specialist in [place] # ([place]): [phone]

## 2022-05-23 ENCOUNTER — Other Ambulatory Visit (HOSPITAL_COMMUNITY): Payer: Self-pay

## 2022-05-24 ENCOUNTER — Ambulatory Visit (HOSPITAL_COMMUNITY): Payer: Medicare HMO | Admitting: Physical Therapy

## 2022-05-24 DIAGNOSIS — M5459 Other low back pain: Secondary | ICD-10-CM | POA: Diagnosis not present

## 2022-05-24 NOTE — Therapy (Signed)
OUTPATIENT PHYSICAL THERAPY TREATMENT   Patient Name: LUNABELLA BADGETT MRN: [id number] DOB:[birth year], 67 y.o., female Today's Date: 05/24/2022  END OF SESSION:  PT End of Session - 05/24/22 0908     Visit Number 4    Number of Visits 12    Date for PT Re-Evaluation 06/13/22    Authorization Type [employer] Medicare    Progress Note Due on Visit 10    PT Start Time 0905    PT Stop Time 0045    PT Time Calculation (min) 940 min    Activity Tolerance Patient tolerated treatment well              Past Medical History:  Diagnosis Date   Abnormal Pap smear of cervix 2006   Repeat Pap normal   Allergy    Animal bite 03/18/2011   Dog Bite   Arthritis    Asthma    Classical migraine with intractable migraine 11/30/2015   Connective tissue disease, undifferentiated ([place]) 2015   Dr. Amil Amen   Female hypogonadism syndrome 01/13/2019   GERD (gastroesophageal reflux disease)    Hyperlipidemia    Hypertension    Migraine    Vitamin D deficiency disease 01/13/2019   Wears glasses    Past Surgical History:  Procedure Laterality Date   ABDOMINAL HYSTERECTOMY  2003   hyst   BACK SURGERY  2009   lumb lam-fusion   Basil cell     BREAST REDUCTION SURGERY  05/26/2012   Procedure: MAMMARY REDUCTION  (BREAST);  Surgeon: Erline Hau, MD;  Location: [facility];  Service: Plastics;  Laterality: Bilateral;   COLONOSCOPY  2003, 2018   diverticulosis   Patient Active Problem List   Diagnosis Date Noted   Female hypogonadism syndrome 01/13/2019   Vitamin D deficiency disease 01/13/2019   Classical migraine with intractable migraine 11/30/2015   Upper airway cough syndrome 04/07/2013   Essential hypertension, benign 04/07/2013   Extrinsic asthma, unspecified 03/05/2013   Animal bite 03/18/2011    PCP: Deland Pretty MD  REFERRING PROVIDER: Vallarie Mare, MD  REFERRING DIAG: PT eval/tx for [id number] lumbar stenosis w/neurogenic claudication per Duffy Rhody,  MD  Rationale for Evaluation and Treatment: Rehabilitation  THERAPY DIAG:  Other low back pain  ONSET DATE: April 2022   SUBJECTIVE:                                                                                                                                                                                           SUBJECTIVE STATEMENT: Pt states that her back continues to improve.    PERTINENT HISTORY:  Lumbar fusion 2007,  HTN, non differentiated connective tissue disorder   PAIN:  Are you having pain? No and Yes: NPRS scale: 0/10 Pain location: low back, bilat thighs  Pain description: sharp, dull, aching  Aggravating factors: lumbar extension, lifting, carrying, prolonged standing   Relieving factors: laying, sitting, meds   PRECAUTIONS: None  WEIGHT BEARING RESTRICTIONS: No  FALLS:  Has patient fallen in last 6 months? 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 At L3-4 there is a mild broad-based disc bulge. Severe bilateral facet arthropathy. Severe spinal stenosis. Moderate right and mild left foraminal stenosis. 2. Posterior lumbar interbody fusion at L4-5 and L5-S1 without foraminal or central canal stenosis. 3. No acute osseous injury of the lumbar spine.  IMPRESSION: 1. L4-S1 PLIF with widely patent spinal canal and neural foramina. Solid arthrodesis. 2. Mild L3-4 degenerative disc disease and facet arthrosis without stenosis.   Aortic Atherosclerosis (ICD10-I70.0).  PATIENT SURVEYS:  FOTO 51% function   COGNITION: Overall cognitive status: Within functional limits for tasks  assessed     SENSATION: WFL  PALPATION: Min/ mod TTP about RT upper glute med/ piriformis   LUMBAR ROM:   AROM eval  Flexion WFL  Extension 75% limited  Right lateral flexion WFL  Left lateral flexion WFL  Right rotation   Left rotation    (Blank rows = not tested)  LOWER EXTREMITY ROM:     AROM WFL   LOWER EXTREMITY MMT:    MMT Right eval Left eval  Hip flexion 5 5  Hip extension 4 4-  Hip abduction 4 4  Hip adduction    Hip internal rotation    Hip external rotation    Knee flexion    Knee extension 5 4+  Ankle dorsiflexion 5 5  Ankle plantarflexion    Ankle inversion    Ankle eversion     (Blank rows = not tested)  LUMBAR SPECIAL TESTS:  (-) sacral compression, (-) SLR    TODAY'S TREATMENT:                                                                                                                              DATE:  05/24/22: Standing:  Wall arch x 10 Functional squat x 10 Seated: Piriformis 3 x 30" Supine:  Dead bug x 10 Bridge x 10  Active hamstring stretch 3 x 30"  Quadriped  Mad cat old horse x 5 Prone:  poe  Single leg raise x 10  Prone single arm raise.  05/22/2022  Bridge 10X  SLR 10X each  Hamstring stretch 5X30" each with UE assist  Sidelying: hip abduction 2X10 each  Clams 10X5: each Prone:  hip extensions 10X  POE 2X1 minute  05/02/22 Eval     PATIENT EDUCATION:  Education details: Updated HEP Person educated: Patient Education method: Explanation Education comprehension: verbalized understanding  HOME EXERCISE PROGRAM: Access Code: [email or web address] URL: [email or web address]  Date: 05/22/2022 - Seated Hamstring Stretch  - 1-2 x daily - 5-7 x weekly - 3 reps - 30 hold - Supine Posterior Pelvic Tilt  - 1-2 x daily - 5-7 x weekly - 2 sets - 10 reps - 6 hold - Seated Piriformis Stretch  - 1-2 x daily - 5-7 x weekly - 3 reps - 30 hold - Clamshell with Resistance  - 1-2 x daily - 5-7 x weekly - 2 sets - 10 reps -  Standing Hip Abduction with Counter Support  - 1-2 x daily - 5-7 x weekly - 2 sets - 10 reps - Standing Hip Extension with Counter Support  - 1-2 x daily - 5-7 x weekly - 2 sets - 10 reps  Date: 05/02/2022 Prepared by: Josue Hector Exercises - Supine Transversus Abdominis Bracing - Hands on Stomach  - 2 x daily - 7 x weekly - 1-2 sets - 10 reps - 5 second hold - Small Range Straight Leg Raise  - 2 x daily - 7 x weekly - 2 sets - 10 reps  Access Code: [email or web address] URL: [email or web address] Date: 05/15/2022 Prepared by: Roseanne Reno Exercises -- Hooklying Active Hamstring Stretch  - 2 x daily - 7 x weekly - 1 sets - 5 reps - 30 sec hold - Beginner Bridge  - 2 x daily - 7 x weekly - 2 sets - 10 reps - Sidelying Clamshell in Neutral  - 2 x daily - 7 x weekly - 1 sets - 10 reps - Sidelying Hip Abduction  - 2 x daily - 7 x weekly - 2 sets - 10 reps - Prone Hip Extension  - 2 x daily - 7 x weekly - 2 sets - 10 reps - Prone Press Up On Elbows  - 2 x daily - 7 x weekly - 1 sets - 5 reps - 1 minute hold  ASSESSMENT:  CLINICAL IMPRESSION: Added standing and prone exercises with verbal and manual cuing needed for proper technique.  Attempted quadriped single leg raise, however, this was to difficult for pt to complete.  Pt continues to demonstrate instability in her low back area as well as tightness and will continue to benefit from skilled PT to address these issues and maximize her functional ability.    OBJECTIVE IMPAIRMENTS: decreased activity tolerance, decreased mobility, difficulty walking, decreased ROM, decreased strength, increased fascial restrictions, impaired flexibility, improper body mechanics, and pain.   ACTIVITY LIMITATIONS: carrying, lifting, bending, sitting, standing, squatting, stairs, transfers, bed mobility, and locomotion level  PARTICIPATION LIMITATIONS: meal prep, cleaning, laundry, driving, shopping, community activity, occupation, and yard  work  PERSONAL FACTORS: Time since onset of injury/illness/exacerbation are also affecting patient's functional outcome.   REHAB POTENTIAL: Good  CLINICAL DECISION MAKING: Stable/uncomplicated  EVALUATION COMPLEXITY: Low   GOALS: SHORT TERM GOALS: Target date: 05/23/2022  Patient will be independent with initial HEP and self-management strategies to improve functional outcomes Baseline:  Goal status: IN PROGRESS    LONG TERM GOALS: Target date: 06/13/2022  Patient will be independent with advanced HEP and self-management strategies to improve functional outcomes Baseline:  Goal status: IN PROGRESS  2.  Patient will improve FOTO score to predicted value to indicate  improvement in functional outcomes Baseline: 51% function Goal status: IN PROGRESS  3.  Patient will report reduction of back pain to <3/10 with activity for improved quality of life and ability to perform ADLs  Baseline: 7-8/10 Goal status: IN PROGRESS  4. Patient will have equal to or > 4+/5 MMT throughout BLE to improve ability to perform functional mobility, stair ambulation and ADLs.  Baseline: See MMT  Goal status: IN PROGRESS PLAN:  PT FREQUENCY: 1-2x/week  PT DURATION: 6 weeks  PLANNED INTERVENTIONS: Therapeutic exercises, Therapeutic activity, Neuromuscular re-education, Balance training, Gait training, Patient/Family education, Joint manipulation, Joint mobilization, Stair training, Aquatic Therapy, Dry Needling, Electrical stimulation, Spinal manipulation, Spinal mobilization, Cryotherapy, Moist heat, scar mobilization, Taping, Traction, Ultrasound, Biofeedback, Ionotophoresis '4mg'$ /ml Dexamethasone, and Manual therapy. Marland Kitchen  PLAN FOR NEXT SESSION: Continue POC and may progress as tolerated working on flexibility and core and hip strength  Rayetta Humphrey, PT CLT [phone]  [phone]

## 2022-05-27 ENCOUNTER — Encounter (HOSPITAL_COMMUNITY): Payer: Self-pay | Admitting: Physical Therapy

## 2022-05-27 ENCOUNTER — Ambulatory Visit (HOSPITAL_COMMUNITY): Payer: Medicare HMO | Admitting: Physical Therapy

## 2022-05-27 DIAGNOSIS — M5459 Other low back pain: Secondary | ICD-10-CM

## 2022-05-27 NOTE — Therapy (Signed)
OUTPATIENT PHYSICAL THERAPY TREATMENT   Patient Name: Victoria Cooper MRN: [id number] DOB:May 29, 1955, 67 y.o., female Today's Date: 05/27/2022  END OF SESSION:  PT End of Session - 05/27/22 1437     Visit Number 5    Number of Visits 12    Date for PT Re-Evaluation 06/13/22    Authorization Type [employer] Medicare    Progress Note Due on Visit 10    PT Start Time 2094    PT Stop Time 1519    PT Time Calculation (min) 42 min    Activity Tolerance Patient tolerated treatment well    Behavior During Therapy [facility] for tasks assessed/performed              Past Medical History:  Diagnosis Date   Abnormal Pap smear of cervix 2006   Repeat Pap normal   Allergy    Animal bite 03/18/2011   Dog Bite   Arthritis    Asthma    Classical migraine with intractable migraine 11/30/2015   Connective tissue disease, undifferentiated ([place]) 2015   Dr. Amil Amen   Female hypogonadism syndrome 01/13/2019   GERD (gastroesophageal reflux disease)    Hyperlipidemia    Hypertension    Migraine    Vitamin D deficiency disease 01/13/2019   Wears glasses    Past Surgical History:  Procedure Laterality Date   ABDOMINAL HYSTERECTOMY  2003   hyst   BACK SURGERY  2009   lumb lam-fusion   Basil cell     BREAST REDUCTION SURGERY  05/26/2012   Procedure: MAMMARY REDUCTION  (BREAST);  Surgeon: Erline Hau, MD;  Location: [facility];  Service: Plastics;  Laterality: Bilateral;   COLONOSCOPY  2003, 2018   diverticulosis   Patient Active Problem List   Diagnosis Date Noted   Female hypogonadism syndrome 01/13/2019   Vitamin D deficiency disease 01/13/2019   Classical migraine with intractable migraine 11/30/2015   Upper airway cough syndrome 04/07/2013   Essential hypertension, benign 04/07/2013   Extrinsic asthma, unspecified 03/05/2013   Animal bite 03/18/2011    PCP: Deland Pretty MD  REFERRING PROVIDER: Vallarie Mare, MD  REFERRING DIAG: PT eval/tx for [id number]  lumbar stenosis w/neurogenic claudication per Duffy Rhody, MD  Rationale for Evaluation and Treatment: Rehabilitation  THERAPY DIAG:  Other low back pain  ONSET DATE: April 2022   SUBJECTIVE:                                                                                                                                                                                           SUBJECTIVE STATEMENT: Pt states that her back is okay.  Still issues with walking.   PERTINENT HISTORY:  Lumbar fusion 2007, HTN, non differentiated connective tissue disorder   PAIN:  Are you having pain? No and Yes: NPRS scale: 0/10 Pain location: low back, bilat thighs  Pain description: sharp, dull, aching  Aggravating factors: lumbar extension, lifting, carrying, prolonged standing   Relieving factors: laying, sitting, meds   PRECAUTIONS: None  WEIGHT BEARING RESTRICTIONS: No  FALLS:  Has patient fallen in last 6 months? 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 At L3-4 there is a mild broad-based disc bulge. Severe bilateral facet arthropathy. Severe spinal stenosis. Moderate right and mild left foraminal stenosis. 2. Posterior lumbar interbody fusion at L4-5 and L5-S1 without foraminal or central canal stenosis. 3. No acute osseous injury of the lumbar spine.  IMPRESSION: 1. L4-S1 PLIF with widely patent spinal canal and neural foramina. Solid arthrodesis. 2. Mild L3-4 degenerative disc disease and facet arthrosis without stenosis.   Aortic Atherosclerosis (ICD10-I70.0).  PATIENT SURVEYS:  FOTO 51% function    COGNITION: Overall cognitive status: Within functional limits for tasks assessed     SENSATION: WFL  PALPATION: Min/ mod TTP about RT upper glute med/ piriformis   LUMBAR ROM:   AROM eval  Flexion WFL  Extension 75% limited  Right lateral flexion WFL  Left lateral flexion WFL  Right rotation   Left rotation    (Blank rows = not tested)  LOWER EXTREMITY ROM:     AROM WFL   LOWER EXTREMITY MMT:    MMT Right eval Left eval  Hip flexion 5 5  Hip extension 4 4-  Hip abduction 4 4  Hip adduction    Hip internal rotation    Hip external rotation    Knee flexion    Knee extension 5 4+  Ankle dorsiflexion 5 5  Ankle plantarflexion    Ankle inversion    Ankle eversion     (Blank rows = not tested)  LUMBAR SPECIAL TESTS:  (-) sacral compression, (-) SLR    TODAY'S TREATMENT:                                                                                                                              DATE:  05/27/22 Bridge 1 x 20  DKTC with heels on red ball 2 x 10 with 5 second holds Dead bug isometric 2x 10 5 second holds POE 2x 1 minute Press up 1 x 10  Quadruped hip extension 1 x 10 Standing shoulder extension GTB 2 x 10  Standing shoulder row GTB 3x10 Palof press GTB 2  x 10 bilateral  Lateral stepping 6 x 10 feet RTB at knees  05/24/22: Standing:  Wall arch x 10 Functional squat x 10 Seated: Piriformis 3 x 30" Supine:  Dead bug x 10 Bridge x 10  Active hamstring stretch 3 x 30"  Quadriped  Mad cat old horse x 5 Prone:  poe  Single leg raise x 10  Prone single arm raise.    05/22/2022  Bridge 10X  SLR 10X each  Hamstring stretch 5X30" each with UE assist Sidelying: hip abduction 2X10 each  Clams 10X5: each Prone:  hip extensions 10X  POE 2X1 minute  05/02/22 Eval     PATIENT EDUCATION:  Education details: Updated HEP Person educated: Patient Education method: Explanation Education comprehension: verbalized understanding  HOME  EXERCISE PROGRAM: Access Code: [email or web address] URL: [email or web address]  Date: 05/22/2022 - Seated Hamstring Stretch  - 1-2 x daily - 5-7 x weekly - 3 reps - 30 hold - Supine Posterior Pelvic Tilt  - 1-2 x daily - 5-7 x weekly - 2 sets - 10 reps - 6 hold - Seated Piriformis Stretch  - 1-2 x daily - 5-7 x weekly - 3 reps - 30 hold - Clamshell with Resistance  - 1-2 x daily - 5-7 x weekly - 2 sets - 10 reps - Standing Hip Abduction with Counter Support  - 1-2 x daily - 5-7 x weekly - 2 sets - 10 reps - Standing Hip Extension with Counter Support  - 1-2 x daily - 5-7 x weekly - 2 sets - 10 reps  Date: 05/02/2022 Prepared by: Josue Hector Exercises - Supine Transversus Abdominis Bracing - Hands on Stomach  - 2 x daily - 7 x weekly - 1-2 sets - 10 reps - 5 second hold - Small Range Straight Leg Raise  - 2 x daily - 7 x weekly - 2 sets - 10 reps  Access Code: [email or web address] URL: [email or web address] Date: 05/15/2022 Prepared by: Roseanne Reno Exercises -- Hooklying Active Hamstring Stretch  - 2 x daily - 7 x weekly - 1 sets - 5 reps - 30 sec hold - Beginner Bridge  - 2 x daily - 7 x weekly - 2 sets - 10 reps - Sidelying Clamshell in Neutral  - 2 x daily - 7 x weekly - 1 sets - 10 reps - Sidelying Hip Abduction  - 2 x daily - 7 x weekly - 2 sets - 10 reps - Prone Hip Extension  - 2 x daily - 7 x weekly - 2 sets - 10 reps - Prone Press Up On Elbows  - 2 x daily - 7 x weekly - 1 sets - 5 reps - 1 minute hold  ASSESSMENT:  CLINICAL IMPRESSION: Tolerates increased reps of bridge exercise with cueing for prior glute activation. Continued with core and glute strengthening which is tolerated well. Patient appears to have been tolerated POE exercise well, added press up with improving mobility with reps, no increase in symptoms following. Educated on possible benefit and to discontinue if increasing symptoms. Began resisted postural strengthening. Patient will continue to benefit  from physical therapy in order to improve function and reduce impairment.    OBJECTIVE IMPAIRMENTS: decreased activity tolerance, decreased mobility, difficulty walking, decreased ROM, decreased strength, increased fascial restrictions, impaired flexibility, improper body mechanics, and pain.   ACTIVITY LIMITATIONS: carrying, lifting, bending, sitting, standing, squatting, stairs, transfers, bed mobility, and locomotion level  PARTICIPATION LIMITATIONS: meal prep, cleaning, laundry, driving, shopping, community activity, occupation, and yard work  PERSONAL FACTORS: Time since onset of injury/illness/exacerbation are also affecting patient's functional outcome.   REHAB POTENTIAL: Good  CLINICAL DECISION MAKING: Stable/uncomplicated  EVALUATION COMPLEXITY: Low   GOALS: SHORT TERM GOALS: Target date: 05/23/2022  Patient will be independent with initial HEP and self-management strategies to improve functional outcomes Baseline:  Goal status: IN PROGRESS    LONG TERM GOALS: Target date: 06/13/2022  Patient will be independent with advanced HEP and self-management strategies to improve functional outcomes Baseline:  Goal status: IN PROGRESS  2.  Patient will improve FOTO score to predicted value to indicate improvement in functional outcomes Baseline: 51% function Goal status: IN PROGRESS  3.  Patient will report reduction of back pain to <3/10 with activity for improved quality of life and ability to perform ADLs  Baseline: 7-8/10 Goal status: IN PROGRESS  4. Patient will have equal to or > 4+/5 MMT throughout BLE to improve ability to perform functional mobility, stair ambulation and ADLs.  Baseline: See MMT  Goal status: IN PROGRESS PLAN:  PT FREQUENCY: 1-2x/week  PT DURATION: 6 weeks  PLANNED INTERVENTIONS: Therapeutic exercises, Therapeutic activity, Neuromuscular re-education, Balance training, Gait training, Patient/Family education, Joint manipulation, Joint  mobilization, Stair training, Aquatic Therapy, Dry Needling, Electrical stimulation, Spinal manipulation, Spinal mobilization, Cryotherapy, Moist heat, scar mobilization, Taping, Traction, Ultrasound, Biofeedback, Ionotophoresis '4mg'$ /ml Dexamethasone, and Manual therapy. Marland Kitchen  PLAN FOR NEXT SESSION: Continue POC and may progress as tolerated working on flexibility and core and hip strength  3:19 PM, 05/27/22 Mearl Latin PT, DPT Physical Therapist at [facility]

## 2022-05-29 ENCOUNTER — Encounter (HOSPITAL_COMMUNITY): Payer: Medicare HMO

## 2022-06-03 ENCOUNTER — Ambulatory Visit (HOSPITAL_COMMUNITY): Payer: Medicare HMO | Attending: Internal Medicine | Admitting: Physical Therapy

## 2022-06-03 DIAGNOSIS — M5459 Other low back pain: Secondary | ICD-10-CM | POA: Insufficient documentation

## 2022-06-03 NOTE — Therapy (Signed)
OUTPATIENT PHYSICAL THERAPY TREATMENT   Patient Name: ILDA LASKIN MRN: [id number] DOB:11/01/1955, 67 y.o., female Today's Date: 06/03/2022  END OF SESSION:  PT End of Session - 06/03/22 1309     Visit Number 6    Number of Visits 12    Date for PT Re-Evaluation 06/13/22    Authorization Type [employer] Medicare    Progress Note Due on Visit 10    PT Start Time 1304    PT Stop Time 7948    PT Time Calculation (min) 41 min    Activity Tolerance Patient tolerated treatment well    Behavior During Therapy [facility] for tasks assessed/performed              Past Medical History:  Diagnosis Date   Abnormal Pap smear of cervix 2006   Repeat Pap normal   Allergy    Animal bite 03/18/2011   Dog Bite   Arthritis    Asthma    Classical migraine with intractable migraine 11/30/2015   Connective tissue disease, undifferentiated ([place]) 2015   Dr. Amil Amen   Female hypogonadism syndrome 01/13/2019   GERD (gastroesophageal reflux disease)    Hyperlipidemia    Hypertension    Migraine    Vitamin D deficiency disease 01/13/2019   Wears glasses    Past Surgical History:  Procedure Laterality Date   ABDOMINAL HYSTERECTOMY  2003   hyst   BACK SURGERY  2009   lumb lam-fusion   Basil cell     BREAST REDUCTION SURGERY  05/26/2012   Procedure: MAMMARY REDUCTION  (BREAST);  Surgeon: Erline Hau, MD;  Location: [place];  Service: Plastics;  Laterality: Bilateral;   COLONOSCOPY  2003, 2018   diverticulosis   Patient Active Problem List   Diagnosis Date Noted   Female hypogonadism syndrome 01/13/2019   Vitamin D deficiency disease 01/13/2019   Classical migraine with intractable migraine 11/30/2015   Upper airway cough syndrome 04/07/2013   Essential hypertension, benign 04/07/2013   Extrinsic asthma, unspecified 03/05/2013   Animal bite 03/18/2011    PCP: Deland Pretty MD  REFERRING PROVIDER: Vallarie Mare, MD  REFERRING DIAG: PT eval/tx for [phone]  lumbar stenosis w/neurogenic claudication per Duffy Rhody, MD  Rationale for Evaluation and Treatment: Rehabilitation  THERAPY DIAG:  Other low back pain  ONSET DATE: April 2022   SUBJECTIVE:                                                                                                                                                                                           SUBJECTIVE STATEMENT: Pt states her pain is around a  1/10 into her bil lateral thighs.     PERTINENT HISTORY:  Lumbar fusion 2007, HTN, non differentiated connective tissue disorder   PAIN:  Are you having pain? No and Yes: NPRS scale: 0/10 Pain location: low back, bilat thighs  Pain description: sharp, dull, aching  Aggravating factors: lumbar extension, lifting, carrying, prolonged standing   Relieving factors: laying, sitting, meds   PRECAUTIONS: None  WEIGHT BEARING RESTRICTIONS: No  FALLS:  Has patient fallen in last 6 months? 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 At L3-4 there is a mild broad-based disc bulge. Severe bilateral facet arthropathy. Severe spinal stenosis. Moderate right and mild left foraminal stenosis. 2. Posterior lumbar interbody fusion at L4-5 and L5-S1 without foraminal or central canal stenosis. 3. No acute osseous injury of the lumbar spine.  IMPRESSION: 1. L4-S1 PLIF with widely patent spinal canal and neural foramina. Solid arthrodesis. 2. Mild L3-4 degenerative disc disease and facet arthrosis without stenosis.   Aortic Atherosclerosis (ICD10-I70.0).  PATIENT SURVEYS:  FOTO 51%  function   COGNITION: Overall cognitive status: Within functional limits for tasks assessed     SENSATION: WFL  PALPATION: Min/ mod TTP about RT upper glute med/ piriformis   LUMBAR ROM:   AROM eval  Flexion WFL  Extension 75% limited  Right lateral flexion WFL  Left lateral flexion WFL  Right rotation   Left rotation    (Blank rows = not tested)  LOWER EXTREMITY ROM:     AROM WFL   LOWER EXTREMITY MMT:    MMT Right eval Left eval  Hip flexion 5 5  Hip extension 4 4-  Hip abduction 4 4  Hip adduction    Hip internal rotation    Hip external rotation    Knee flexion    Knee extension 5 4+  Ankle dorsiflexion 5 5  Ankle plantarflexion    Ankle inversion    Ankle eversion     (Blank rows = not tested)  LUMBAR SPECIAL TESTS:  (-) sacral compression, (-) SLR    TODAY'S TREATMENT:                                                                                                                              DATE:  06/03/22 Standing: Standing shoulder extension GTB 2 x 10  Shoulder row GTB 2x10 Paloff press GTB 2 x 10 bilateral  Vectors 10X3" with 1 UE assist Lateral stepping with GTB at ankles 2RT Seated  Quadruped hip extension with opposite UE flexion 2X10 Supine: Bridge 1 x 20  Roll in to [facility] with  heels on red ball 2 x 10 with 5 second holds POE 2x 1 minute Press up 1 x 10   05/27/22 Bridge 1 x 20  DKTC with heels on red ball 2 x 10 with 5 second holds Dead bug isometric 2x 10 5 second holds POE 2x 1 minute Press up 1 x 10  Quadruped hip extension 1 x 10 Standing shoulder extension GTB 2 x 10  Standing shoulder row GTB 3x10 Palof press GTB 2 x 10 bilateral  Lateral stepping 6 x 10 feet RTB at knees  05/24/22: Standing:  Wall arch x 10 Functional squat x 10 Seated: Piriformis 3 x 30" Supine:  Dead bug x 10 Bridge x 10  Active hamstring stretch 3 x 30"  Quadriped  Mad cat old horse x 5 Prone:  poe  Single leg raise x 10  Prone single arm  raise.    05/22/2022  Bridge 10X  SLR 10X each  Hamstring stretch 5X30" each with UE assist Sidelying: hip abduction 2X10 each  Clams 10X5: each Prone:  hip extensions 10X  POE 2X1 minute  05/02/22 Eval     PATIENT EDUCATION:  Education details: Updated HEP Person educated: Patient Education method: Explanation Education comprehension: verbalized understanding  HOME EXERCISE PROGRAM: Access Code: [email or web address] URL: [email or web address]  Date: 05/22/2022 - Seated Hamstring Stretch  - 1-2 x daily - 5-7 x weekly - 3 reps - 30 hold - Supine Posterior Pelvic Tilt  - 1-2 x daily - 5-7 x weekly - 2 sets - 10 reps - 6 hold - Seated Piriformis Stretch  - 1-2 x daily - 5-7 x weekly - 3 reps - 30 hold - Clamshell with Resistance  - 1-2 x daily - 5-7 x weekly - 2 sets - 10 reps - Standing Hip Abduction with Counter Support  - 1-2 x daily - 5-7 x weekly - 2 sets - 10 reps - Standing Hip Extension with Counter Support  - 1-2 x daily - 5-7 x weekly - 2 sets - 10 reps  Date: 05/02/2022 Prepared by: Josue Hector Exercises - Supine Transversus Abdominis Bracing - Hands on Stomach  - 2 x daily - 7 x weekly - 1-2 sets - 10 reps - 5 second hold - Small Range Straight Leg Raise  - 2 x daily - 7 x weekly - 2 sets - 10 reps  Access Code: [email or web address] URL: [email or web address] Date: 05/15/2022 Prepared by: Roseanne Reno Exercises -- Hooklying Active Hamstring Stretch  - 2 x daily - 7 x weekly - 1 sets - 5 reps - 30 sec hold - Beginner Bridge  - 2 x daily - 7 x weekly - 2 sets - 10 reps - Sidelying Clamshell in Neutral  - 2 x daily - 7 x weekly - 1 sets - 10 reps - Sidelying Hip Abduction  - 2 x daily - 7 x weekly - 2 sets - 10 reps - Prone Hip Extension  - 2 x daily - 7 x weekly - 2 sets - 10 reps - Prone Press Up On Elbows  - 2 x daily - 7 x weekly - 1 sets - 5 reps - 1 minute hold  ASSESSMENT:  CLINICAL IMPRESSION: Continued with established core and LE  strengthening exercises.  Completed 2 sets of all with addition of vector being most challenging for patient.  Pt required cues to complete therex more slowly/controlled.  Difficulty maintaining core with quadruped activity.  Increased difficulty of roll in/outs with ball with maintaining isometric  glute contraction.  Noted challenge with activities this session but tolerated well with min c/o discomfort. Finished session with prone on elbows and press-ups with reduction in symptoms. Patient will continue to benefit from physical therapy in order to improve function and reduce impairment.    OBJECTIVE IMPAIRMENTS: decreased activity tolerance, decreased mobility, difficulty walking, decreased ROM, decreased strength, increased fascial restrictions, impaired flexibility, improper body mechanics, and pain.   ACTIVITY LIMITATIONS: carrying, lifting, bending, sitting, standing, squatting, stairs, transfers, bed mobility, and locomotion level  PARTICIPATION LIMITATIONS: meal prep, cleaning, laundry, driving, shopping, community activity, occupation, and yard work  PERSONAL FACTORS: Time since onset of injury/illness/exacerbation are also affecting patient's functional outcome.   REHAB POTENTIAL: Good  CLINICAL DECISION MAKING: Stable/uncomplicated  EVALUATION COMPLEXITY: Low   GOALS: SHORT TERM GOALS: Target date: 05/23/2022  Patient will be independent with initial HEP and self-management strategies to improve functional outcomes Baseline:  Goal status: IN PROGRESS    LONG TERM GOALS: Target date: 06/13/2022  Patient will be independent with advanced HEP and self-management strategies to improve functional outcomes Baseline:  Goal status: IN PROGRESS  2.  Patient will improve FOTO score to predicted value to indicate improvement in functional outcomes Baseline: 51% function Goal status: IN PROGRESS  3.  Patient will report reduction of back pain to <3/10 with activity for improved  quality of life and ability to perform ADLs  Baseline: 7-8/10 Goal status: IN PROGRESS  4. Patient will have equal to or > 4+/5 MMT throughout BLE to improve ability to perform functional mobility, stair ambulation and ADLs.  Baseline: See MMT  Goal status: IN PROGRESS PLAN:  PT FREQUENCY: 1-2x/week  PT DURATION: 6 weeks  PLANNED INTERVENTIONS: Therapeutic exercises, Therapeutic activity, Neuromuscular re-education, Balance training, Gait training, Patient/Family education, Joint manipulation, Joint mobilization, Stair training, Aquatic Therapy, Dry Needling, Electrical stimulation, Spinal manipulation, Spinal mobilization, Cryotherapy, Moist heat, scar mobilization, Taping, Traction, Ultrasound, Biofeedback, Ionotophoresis '4mg'$ /ml Dexamethasone, and Manual therapy. Marland Kitchen  PLAN FOR NEXT SESSION: Continue POC and may progress as tolerated working on flexibility and core and hip strength    1:53 PM, 06/03/22 Teena Irani, PTA/CLT [place] Ph: [phone]

## 2022-06-05 ENCOUNTER — Encounter (HOSPITAL_COMMUNITY): Payer: Medicare HMO | Admitting: Physical Therapy

## 2022-06-10 ENCOUNTER — Other Ambulatory Visit (HOSPITAL_COMMUNITY): Payer: Self-pay

## 2022-06-10 ENCOUNTER — Encounter (HOSPITAL_COMMUNITY): Payer: Self-pay | Admitting: Physical Therapy

## 2022-06-10 ENCOUNTER — Ambulatory Visit (HOSPITAL_COMMUNITY): Payer: Medicare HMO | Admitting: Physical Therapy

## 2022-06-10 ENCOUNTER — Encounter (HOSPITAL_COMMUNITY): Payer: Medicare HMO | Admitting: Physical Therapy

## 2022-06-10 DIAGNOSIS — M5459 Other low back pain: Secondary | ICD-10-CM

## 2022-06-10 NOTE — Therapy (Signed)
OUTPATIENT PHYSICAL THERAPY TREATMENT   Patient Name: Victoria Cooper MRN: [id number] DOB:1955-07-02, 67 y.o., female Today's Date: 06/10/2022  END OF SESSION:  PT End of Session - 06/10/22 0814     Visit Number 7    Number of Visits 12    Date for PT Re-Evaluation 06/13/22    Authorization Type [employer] Medicare    Progress Note Due on Visit 10    PT Start Time 0816    PT Stop Time [id number]    PT Time Calculation (min) 39 min    Activity Tolerance Patient tolerated treatment well    Behavior During Therapy [facility] for tasks assessed/performed              Past Medical History:  Diagnosis Date   Abnormal Pap smear of cervix 2006   Repeat Pap normal   Allergy    Animal bite 03/18/2011   Dog Bite   Arthritis    Asthma    Classical migraine with intractable migraine 11/30/2015   Connective tissue disease, undifferentiated ([place]) 2015   Dr. Amil Amen   Female hypogonadism syndrome 01/13/2019   GERD (gastroesophageal reflux disease)    Hyperlipidemia    Hypertension    Migraine    Vitamin D deficiency disease 01/13/2019   Wears glasses    Past Surgical History:  Procedure Laterality Date   ABDOMINAL HYSTERECTOMY  2003   hyst   BACK SURGERY  2009   lumb lam-fusion   Basil cell     BREAST REDUCTION SURGERY  05/26/2012   Procedure: MAMMARY REDUCTION  (BREAST);  Surgeon: Erline Hau, MD;  Location: [place];  Service: Plastics;  Laterality: Bilateral;   COLONOSCOPY  2003, 2018   diverticulosis   Patient Active Problem List   Diagnosis Date Noted   Female hypogonadism syndrome 01/13/2019   Vitamin D deficiency disease 01/13/2019   Classical migraine with intractable migraine 11/30/2015   Upper airway cough syndrome 04/07/2013   Essential hypertension, benign 04/07/2013   Extrinsic asthma, unspecified 03/05/2013   Animal bite 03/18/2011    PCP: Deland Pretty MD  REFERRING PROVIDER: Vallarie Mare, MD  REFERRING DIAG: PT eval/tx for [phone]  lumbar stenosis w/neurogenic claudication per Duffy Rhody, MD  Rationale for Evaluation and Treatment: Rehabilitation  THERAPY DIAG:  Other low back pain  ONSET DATE: April 2022   SUBJECTIVE:                                                                                                                                                                                           SUBJECTIVE STATEMENT: Pt states she was doing a little  better. Went for a long walk on Saturday and back was bothering her.   PERTINENT HISTORY:  Lumbar fusion 2007, HTN, non differentiated connective tissue disorder   PAIN:  Are you having pain? No and Yes: NPRS scale: 2-3/10 Pain location: low back, bilat thighs  Pain description: sharp, dull, aching  Aggravating factors: lumbar extension, lifting, carrying, prolonged standing   Relieving factors: laying, sitting, meds   PRECAUTIONS: None  WEIGHT BEARING RESTRICTIONS: No  FALLS:  Has patient fallen in last 6 months? 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 At L3-4 there is a mild broad-based disc bulge. Severe bilateral facet arthropathy. Severe spinal stenosis. Moderate right and mild left foraminal stenosis. 2. Posterior lumbar interbody fusion at L4-5 and L5-S1 without foraminal or central canal stenosis. 3. No acute osseous injury of the lumbar spine.  IMPRESSION: 1. L4-S1 PLIF with widely patent spinal canal and neural foramina. Solid arthrodesis. 2. Mild L3-4 degenerative disc disease and facet arthrosis without stenosis.   Aortic Atherosclerosis  (ICD10-I70.0).  PATIENT SURVEYS:  FOTO 51% function   COGNITION: Overall cognitive status: Within functional limits for tasks assessed     SENSATION: WFL  PALPATION: Min/ mod TTP about RT upper glute med/ piriformis   LUMBAR ROM:   AROM eval  Flexion WFL  Extension 75% limited  Right lateral flexion WFL  Left lateral flexion WFL  Right rotation   Left rotation    (Blank rows = not tested)  LOWER EXTREMITY ROM:     AROM WFL   LOWER EXTREMITY MMT:    MMT Right eval Left eval  Hip flexion 5 5  Hip extension 4 4-  Hip abduction 4 4  Hip adduction    Hip internal rotation    Hip external rotation    Knee flexion    Knee extension 5 4+  Ankle dorsiflexion 5 5  Ankle plantarflexion    Ankle inversion    Ankle eversion     (Blank rows = not tested)  LUMBAR SPECIAL TESTS:  (-) sacral compression, (-) SLR    TODAY'S TREATMENT:                                                                                                                              DATE:  06/10/22 POE 2x 1  Press up 2 x 10  Prone hip extension 2x 10 bilateral  Quadruped hip extension 2 x 10 bilateral  Plank on elbows and knees 3x 20 seconds Lateral stepping with perpendicular resistance 3 plates 1 x 5 bilateral  3 way hip slide outs  1x 5 bilateral   06/03/22 Standing: Standing shoulder extension GTB 2 x 10  Shoulder row GTB 2x10 Paloff press GTB 2 x 10 bilateral  Vectors 10X3" with 1 UE assist Lateral stepping with GTB at ankles 2RT Seated  Quadruped hip extension with opposite UE flexion 2X10 Supine: Bridge 1 x 20  Roll in to [facility] with heels on red ball 2 x 10 with 5 second holds POE 2x 1 minute Press up 1 x 10   05/27/22 Bridge 1 x 20  DKTC with heels on red ball 2 x 10 with 5 second holds Dead bug isometric 2x 10 5 second holds POE 2x 1 minute Press up 1 x 10  Quadruped hip extension 1 x 10 Standing shoulder extension GTB 2 x 10  Standing shoulder row GTB 3x10 Palof press GTB  2 x 10 bilateral  Lateral stepping 6 x 10 feet RTB at knees  05/24/22: Standing:  Wall arch x 10 Functional squat x 10 Seated: Piriformis 3 x 30" Supine:  Dead bug x 10 Bridge x 10  Active hamstring stretch 3 x 30"  Quadriped  Mad cat old horse x 5 Prone:  poe  Single leg raise x 10  Prone single arm raise.    PATIENT EDUCATION:  Education details: Updated HEP Person educated: Patient Education method: Explanation Education comprehension: verbalized understanding  HOME EXERCISE PROGRAM: Access Code: [email or web address] URL: [email or web address] 06/10/22 - Plank on Knees  - 1 x daily - 7 x weekly - 3 reps - 20 second hold - Single Leg Balance with Four Way Reach and Rotation  - 1 x daily - 7 x weekly - 2 sets - 5 reps   Date: 05/22/2022 - Seated Hamstring Stretch  - 1-2 x daily - 5-7 x weekly - 3 reps - 30 hold - Supine Posterior Pelvic Tilt  - 1-2 x daily - 5-7 x weekly - 2 sets - 10 reps - 6 hold - Seated Piriformis Stretch  - 1-2 x daily - 5-7 x weekly - 3 reps - 30 hold - Clamshell with Resistance  - 1-2 x daily - 5-7 x weekly - 2 sets - 10 reps - Standing Hip Abduction with Counter Support  - 1-2 x daily - 5-7 x weekly - 2 sets - 10 reps - Standing Hip Extension with Counter Support  - 1-2 x daily - 5-7 x weekly - 2 sets - 10 reps  Date: 05/02/2022 Prepared by: Josue Hector Exercises - Supine Transversus Abdominis Bracing - Hands on Stomach  - 2 x daily - 7 x weekly - 1-2 sets - 10 reps - 5 second hold - Small Range Straight Leg Raise  - 2 x daily - 7 x weekly - 2 sets - 10 reps  Access Code: [email or web address] URL: [email or web address] Date: 05/15/2022 Prepared by: Roseanne Reno Exercises -- Hooklying Active Hamstring Stretch  - 2 x daily - 7 x weekly - 1 sets - 5 reps - 30 sec hold - Beginner Bridge  - 2 x daily - 7 x weekly - 2 sets - 10 reps - Sidelying Clamshell in Neutral  - 2 x daily - 7 x weekly - 1 sets - 10 reps - Sidelying Hip Abduction  -  2 x daily - 7 x weekly - 2 sets - 10 reps - Prone Hip Extension  - 2 x daily - 7 x weekly - 2 sets - 10 reps - Prone Press Up On Elbows  - 2 x daily - 7  x weekly - 1 sets - 5 reps - 1 minute hold  ASSESSMENT:  CLINICAL IMPRESSION: Continued with established core and LE strengthening exercises. Began planks which are tolerated well with cueing for alignment with good carry over. Fatigue noted on last reps. Additional glute and LE strengthening performed well and updated HEP. Patient will continue to benefit from physical therapy in order to improve function and reduce impairment.    OBJECTIVE IMPAIRMENTS: decreased activity tolerance, decreased mobility, difficulty walking, decreased ROM, decreased strength, increased fascial restrictions, impaired flexibility, improper body mechanics, and pain.   ACTIVITY LIMITATIONS: carrying, lifting, bending, sitting, standing, squatting, stairs, transfers, bed mobility, and locomotion level  PARTICIPATION LIMITATIONS: meal prep, cleaning, laundry, driving, shopping, community activity, occupation, and yard work  PERSONAL FACTORS: Time since onset of injury/illness/exacerbation are also affecting patient's functional outcome.   REHAB POTENTIAL: Good  CLINICAL DECISION MAKING: Stable/uncomplicated  EVALUATION COMPLEXITY: Low   GOALS: SHORT TERM GOALS: Target date: 05/23/2022  Patient will be independent with initial HEP and self-management strategies to improve functional outcomes Baseline:  Goal status: IN PROGRESS    LONG TERM GOALS: Target date: 06/13/2022  Patient will be independent with advanced HEP and self-management strategies to improve functional outcomes Baseline:  Goal status: IN PROGRESS  2.  Patient will improve FOTO score to predicted value to indicate improvement in functional outcomes Baseline: 51% function Goal status: IN PROGRESS  3.  Patient will report reduction of back pain to <3/10 with activity for improved quality  of life and ability to perform ADLs  Baseline: 7-8/10 Goal status: IN PROGRESS  4. Patient will have equal to or > 4+/5 MMT throughout BLE to improve ability to perform functional mobility, stair ambulation and ADLs.  Baseline: See MMT  Goal status: IN PROGRESS PLAN:  PT FREQUENCY: 1-2x/week  PT DURATION: 6 weeks  PLANNED INTERVENTIONS: Therapeutic exercises, Therapeutic activity, Neuromuscular re-education, Balance training, Gait training, Patient/Family education, Joint manipulation, Joint mobilization, Stair training, Aquatic Therapy, Dry Needling, Electrical stimulation, Spinal manipulation, Spinal mobilization, Cryotherapy, Moist heat, scar mobilization, Taping, Traction, Ultrasound, Biofeedback, Ionotophoresis 2m/ml Dexamethasone, and Manual therapy. .Marland Kitchen PLAN FOR NEXT SESSION: Continue POC and may progress as tolerated working on flexibility and core and hip strength; Reassessment   8:16 AM, 06/10/22 AMearl LatinPT, DPT [facility]

## 2022-06-12 ENCOUNTER — Encounter (HOSPITAL_COMMUNITY): Payer: Self-pay | Admitting: Physical Therapy

## 2022-06-12 ENCOUNTER — Ambulatory Visit (HOSPITAL_COMMUNITY): Payer: Medicare HMO | Admitting: Physical Therapy

## 2022-06-12 DIAGNOSIS — M5459 Other low back pain: Secondary | ICD-10-CM

## 2022-06-12 NOTE — Therapy (Signed)
OUTPATIENT PHYSICAL THERAPY TREATMENT   Patient Name: DESHONE SHERBA MRN: [id number] DOB:October 04, 1955, 67 y.o., female Today's Date: 06/12/2022  Progress Note   Reporting Period 05/02/22 to 06/12/22   See note below for Objective Data and Assessment of Progress/Goals   END OF SESSION:  PT End of Session - 06/12/22 0906     Visit Number 8    Number of Visits 12    Date for PT Re-Evaluation 06/13/22    Authorization Type [employer] Medicare    Progress Note Due on Visit 10    PT Start Time 0907    PT Stop Time 0945    PT Time Calculation (min) 38 min    Activity Tolerance Patient tolerated treatment well    Behavior During Therapy [facility] for tasks assessed/performed              Past Medical History:  Diagnosis Date   Abnormal Pap smear of cervix 2006   Repeat Pap normal   Allergy    Animal bite 03/18/2011   Dog Bite   Arthritis    Asthma    Classical migraine with intractable migraine 11/30/2015   Connective tissue disease, undifferentiated ([place]) 2015   Dr. Amil Amen   Female hypogonadism syndrome 01/13/2019   GERD (gastroesophageal reflux disease)    Hyperlipidemia    Hypertension    Migraine    Vitamin D deficiency disease 01/13/2019   Wears glasses    Past Surgical History:  Procedure Laterality Date   ABDOMINAL HYSTERECTOMY  2003   hyst   BACK SURGERY  2009   lumb lam-fusion   Basil cell     BREAST REDUCTION SURGERY  05/26/2012   Procedure: MAMMARY REDUCTION  (BREAST);  Surgeon: Erline Hau, MD;  Location: [place];  Service: Plastics;  Laterality: Bilateral;   COLONOSCOPY  2003, 2018   diverticulosis   Patient Active Problem List   Diagnosis Date Noted   Female hypogonadism syndrome 01/13/2019   Vitamin D deficiency disease 01/13/2019   Classical migraine with intractable migraine 11/30/2015   Upper airway cough syndrome 04/07/2013   Essential hypertension, benign 04/07/2013   Extrinsic asthma, unspecified 03/05/2013   Animal bite  03/18/2011    PCP: Deland Pretty MD  REFERRING PROVIDER: Vallarie Mare, MD  REFERRING DIAG: PT eval/tx for [phone] lumbar stenosis w/neurogenic claudication per Duffy Rhody, MD  Rationale for Evaluation and Treatment: Rehabilitation  THERAPY DIAG:  Other low back pain  ONSET DATE: April 2022   SUBJECTIVE:  SUBJECTIVE STATEMENT: Pt states she is feeling good today. She can stand up straight. Whatever we did last time helped a lot. Patient has been working on [employer]. Patient states 90% improvement since beginning PT. She still feels limited by intermittent symptoms and feels like there is more work to be done. Patient states pain at 9/10 with walking a few days ago which was a rarity. Sometimes does not do things as much as she should.   PERTINENT HISTORY:  Lumbar fusion 2007, HTN, non differentiated connective tissue disorder   PAIN:  Are you having pain? No and Yes: NPRS scale: 1/10 Pain location: low back, bilat thighs  Pain description: sharp, dull, aching  Aggravating factors: lumbar extension, lifting, carrying, prolonged standing   Relieving factors: laying, sitting, meds   PRECAUTIONS: None  WEIGHT BEARING RESTRICTIONS: No  FALLS:  Has patient fallen in last 6 months? 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 At L3-4 there is a mild broad-based disc bulge. Severe bilateral facet arthropathy. Severe spinal  stenosis. Moderate right and mild left foraminal stenosis. 2. Posterior lumbar interbody fusion at L4-5 and L5-S1 without foraminal or central canal stenosis. 3. No acute osseous injury of the lumbar spine.  IMPRESSION: 1. L4-S1 PLIF with widely patent spinal canal and neural foramina. Solid arthrodesis. 2. Mild L3-4 degenerative disc disease and facet arthrosis without stenosis.   Aortic Atherosclerosis (ICD10-I70.0).  PATIENT SURVEYS:  FOTO 51% function   06/12/22 : 52 % function  COGNITION: Overall cognitive status: Within functional limits for tasks assessed     SENSATION: WFL  PALPATION: Min/ mod TTP about RT upper glute med/ piriformis   LUMBAR ROM:   AROM eval 06/12/22  Flexion [facility] WFL  Extension 75% limited 25% limited  Right lateral flexion [facility] WFL  Left lateral flexion [facility] WFL  Right rotation    Left rotation     (Blank rows = not tested)  LOWER EXTREMITY ROM:     AROM WFL   LOWER EXTREMITY MMT:    MMT Right eval Left eval Right 06/12/22 Left 06/12/22  Hip flexion 5 5 5 5  $ Hip extension 4 4- 4+ 4  Hip abduction 4 4 4+ 4  Hip adduction      Hip internal rotation      Hip external rotation      Knee flexion      Knee extension 5 4+ 5 5  Ankle dorsiflexion 5 5 5 5  $ Ankle plantarflexion      Ankle inversion      Ankle eversion       (Blank rows = not tested)  LUMBAR SPECIAL TESTS:  (-) sacral compression, (-) SLR    TODAY'S TREATMENT:  DATE:  06/12/22 Reassessment Press up 2 x 10 Prone hip extension 3x 10 bilateral  Plank on elbows and knees 3x 20 seconds 3 way hip slide outs 2x 5 bilateral   06/10/22 POE 2x 1  Press up 2 x 10  Prone hip extension 2x 10 bilateral  Quadruped hip extension 2 x 10 bilateral  Plank on elbows and knees 3x 20 seconds Lateral stepping with perpendicular resistance 3 plates 1 x 5 bilateral   3 way hip slide outs 1x 5 bilateral   06/03/22 Standing: Standing shoulder extension GTB 2 x 10  Shoulder row GTB 2x10 Paloff press GTB 2 x 10 bilateral  Vectors 10X3" with 1 UE assist Lateral stepping with GTB at ankles 2RT Seated  Quadruped hip extension with opposite UE flexion 2X10 Supine: Bridge 1 x 20  Roll in to [facility] with heels on red ball 2 x 10 with 5 second holds POE 2x 1 minute Press up 1 x 10   05/27/22 Bridge 1 x 20  DKTC with heels on red ball 2 x 10 with 5 second holds Dead bug isometric 2x 10 5 second holds POE 2x 1 minute Press up 1 x 10  Quadruped hip extension 1 x 10 Standing shoulder extension GTB 2 x 10  Standing shoulder row GTB 3x10 Palof press GTB 2 x 10 bilateral  Lateral stepping 6 x 10 feet RTB at knees    PATIENT EDUCATION:  Education details: Updated HEP Person educated: Patient Education method: Explanation Education comprehension: verbalized understanding  HOME EXERCISE PROGRAM: Access Code: [email or web address] URL: [email or web address] 06/10/22 - Plank on Knees  - 1 x daily - 7 x weekly - 3 reps - 20 second hold - Single Leg Balance with Four Way Reach and Rotation  - 1 x daily - 7 x weekly - 2 sets - 5 reps   Date: 05/22/2022 - Seated Hamstring Stretch  - 1-2 x daily - 5-7 x weekly - 3 reps - 30 hold - Supine Posterior Pelvic Tilt  - 1-2 x daily - 5-7 x weekly - 2 sets - 10 reps - 6 hold - Seated Piriformis Stretch  - 1-2 x daily - 5-7 x weekly - 3 reps - 30 hold - Clamshell with Resistance  - 1-2 x daily - 5-7 x weekly - 2 sets - 10 reps - Standing Hip Abduction with Counter Support  - 1-2 x daily - 5-7 x weekly - 2 sets - 10 reps - Standing Hip Extension with Counter Support  - 1-2 x daily - 5-7 x weekly - 2 sets - 10 reps  Date: 05/02/2022 Prepared by: Josue Hector Exercises - Supine Transversus Abdominis Bracing - Hands on Stomach  - 2 x daily - 7 x weekly - 1-2 sets - 10 reps - 5 second hold - Small Range Straight Leg  Raise  - 2 x daily - 7 x weekly - 2 sets - 10 reps  Access Code: [email or web address] URL: [email or web address] Date: 05/15/2022 Prepared by: Roseanne Reno Exercises -- Hooklying Active Hamstring Stretch  - 2 x daily - 7 x weekly - 1 sets - 5 reps - 30 sec hold - Beginner Bridge  - 2 x daily - 7 x weekly - 2 sets - 10 reps - Sidelying Clamshell in Neutral  - 2 x daily - 7 x weekly - 1 sets - 10 reps - Sidelying Hip Abduction  - 2 x daily - 7 x weekly - 2 sets - 10 reps - Prone Hip  Extension  - 2 x daily - 7 x weekly - 2 sets - 10 reps - Prone Press Up On Elbows  - 2 x daily - 7 x weekly - 1 sets - 5 reps - 1 minute hold  ASSESSMENT:  CLINICAL IMPRESSION: Patient has met 1/1 short term goals and 0/4 long term goals with ability to complete HEP. Patient has had a significant improvement in symptoms, strength, ROM, activity tolerance, gait, and functional mobility but remaining goals not met due to continued deficits. Patient has made good progress toward remaining goals. Extending POC 1 x /week for 6 weeks to improve core and hip strength as well as functional mobility to allow for unrestricted return to all ADL. Continued with glute and core strengthening today which is tolerated well. Overall she is progressing very well. Patient will continue to benefit from skilled physical therapy in order to improve function and reduce impairment.     OBJECTIVE IMPAIRMENTS: decreased activity tolerance, decreased mobility, difficulty walking, decreased ROM, decreased strength, increased fascial restrictions, impaired flexibility, improper body mechanics, and pain.   ACTIVITY LIMITATIONS: carrying, lifting, bending, sitting, standing, squatting, stairs, transfers, bed mobility, and locomotion level  PARTICIPATION LIMITATIONS: meal prep, cleaning, laundry, driving, shopping, community activity, occupation, and yard work  PERSONAL FACTORS: Time since onset of injury/illness/exacerbation are also  affecting patient's functional outcome.   REHAB POTENTIAL: Good  CLINICAL DECISION MAKING: Stable/uncomplicated  EVALUATION COMPLEXITY: Low   GOALS: SHORT TERM GOALS: Target date: 05/23/2022  Patient will be independent with initial HEP and self-management strategies to improve functional outcomes Baseline:  Goal status: MET    LONG TERM GOALS: Target date: 06/13/2022  Patient will be independent with advanced HEP and self-management strategies to improve functional outcomes Baseline:  Goal status: IN PROGRESS  2.  Patient will improve FOTO score to predicted value to indicate improvement in functional outcomes Baseline: 51% function 06/12/22: 52% function Goal status: IN PROGRESS  3.  Patient will report reduction of back pain to <3/10 with activity for improved quality of life and ability to perform ADLs  Baseline: 7-8/10 06/12/22 : 9/10 the other day Goal status: IN PROGRESS  4. Patient will have equal to or > 4+/5 MMT throughout BLE to improve ability to perform functional mobility, stair ambulation and ADLs.  Baseline: See MMT  Goal status: IN PROGRESS PLAN:  PT FREQUENCY: 1x/week  PT DURATION: 6 weeks  PLANNED INTERVENTIONS: Therapeutic exercises, Therapeutic activity, Neuromuscular re-education, Balance training, Gait training, Patient/Family education, Joint manipulation, Joint mobilization, Stair training, Aquatic Therapy, Dry Needling, Electrical stimulation, Spinal manipulation, Spinal mobilization, Cryotherapy, Moist heat, scar mobilization, Taping, Traction, Ultrasound, Biofeedback, Ionotophoresis 37m/ml Dexamethasone, and Manual therapy. .Marland Kitchen PLAN FOR NEXT SESSION: Continue POC and may progress as tolerated working on flexibility and core and hip strength   9:06 AM, 06/12/22 AMearl LatinPT, DPT Physical [facility]rap[facility]

## 2022-06-18 ENCOUNTER — Other Ambulatory Visit: Payer: Self-pay

## 2022-06-19 ENCOUNTER — Ambulatory Visit (HOSPITAL_COMMUNITY): Payer: Medicare HMO

## 2022-06-19 DIAGNOSIS — M5459 Other low back pain: Secondary | ICD-10-CM

## 2022-06-19 NOTE — Therapy (Signed)
OUTPATIENT PHYSICAL THERAPY TREATMENT   Patient Name: Victoria Cooper MRN: [id number] DOB:[birth year], 67 y.o., female Today's Date: 06/19/2022  Progress Note   Reporting Period 05/02/22 to 06/12/22   See note below for Objective Data and Assessment of Progress/Goals   END OF SESSION:  PT End of Session - 06/19/22 1353     Visit Number 9    Number of Visits 18    Date for PT Re-Evaluation 07/24/22    Authorization Type AETNA Medicare    Progress Note Due on Visit 18    PT Start Time 1350    PT Stop Time 1430    PT Time Calculation (min) 40 min    Activity Tolerance Patient tolerated treatment well    Behavior During Therapy [facility] for tasks assessed/performed              Past Medical History:  Diagnosis Date   Abnormal Pap smear of cervix 2006   Repeat Pap normal   Allergy    Animal bite 03/18/2011   Dog Bite   Arthritis    Asthma    Classical migraine with intractable migraine 11/30/2015   Connective tissue disease, undifferentiated ([place]) 2015   Dr. Amil Amen   Female hypogonadism syndrome 01/13/2019   GERD (gastroesophageal reflux disease)    Hyperlipidemia    Hypertension    Migraine    Vitamin D deficiency disease 01/13/2019   Wears glasses    Past Surgical History:  Procedure Laterality Date   ABDOMINAL HYSTERECTOMY  2003   hyst   BACK SURGERY  2009   lumb lam-fusion   Basil cell     BREAST REDUCTION SURGERY  05/26/2012   Procedure: MAMMARY REDUCTION  (BREAST);  Surgeon: Erline Hau, MD;  Location: [place];  Service: Plastics;  Laterality: Bilateral;   COLONOSCOPY  2003, 2018   diverticulosis   Patient Active Problem List   Diagnosis Date Noted   Female hypogonadism syndrome 01/13/2019   Vitamin D deficiency disease 01/13/2019   Classical migraine with intractable migraine 11/30/2015   Upper airway cough syndrome 04/07/2013   Essential hypertension, benign 04/07/2013   Extrinsic asthma, unspecified 03/05/2013   Animal bite  03/18/2011    PCP: Deland Pretty MD  REFERRING PROVIDER: Vallarie Mare, MD  REFERRING DIAG: PT eval/tx for [phone] lumbar stenosis w/neurogenic claudication per Duffy Rhody, MD  Rationale for Evaluation and Treatment: Rehabilitation  THERAPY DIAG:  Other low back pain  ONSET DATE: April 2022   SUBJECTIVE:  SUBJECTIVE STATEMENT: Patient reports she is feeling good today.  Occasionally has a pinch or a catch but otherwise no new symptoms.  Sometimes has pain into both glutes and front of thighs; some numbness and tingling sometimes.   PERTINENT HISTORY:  Lumbar fusion 2007, HTN, non differentiated connective tissue disorder   PAIN:  Are you having pain? No and Yes: NPRS scale: 1/10 Pain location: low back, bilat thighs  Pain description: sharp, dull, aching  Aggravating factors: lumbar extension, lifting, carrying, prolonged standing   Relieving factors: laying, sitting, meds   PRECAUTIONS: None  WEIGHT BEARING RESTRICTIONS: No  FALLS:  Has patient fallen in last 6 months? 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 At L3-4 there is a mild broad-based disc bulge. Severe bilateral facet arthropathy. Severe spinal stenosis. Moderate right and mild left foraminal stenosis. 2. Posterior lumbar interbody fusion at L4-5 and L5-S1 without foraminal or central canal stenosis. 3. No acute osseous injury of the lumbar  spine.  IMPRESSION: 1. L4-S1 PLIF with widely patent spinal canal and neural foramina. Solid arthrodesis. 2. Mild L3-4 degenerative disc disease and facet arthrosis without stenosis.   Aortic Atherosclerosis (ICD10-I70.0).  PATIENT SURVEYS:  FOTO 51% function   06/12/22 : 52 % function  COGNITION: Overall cognitive status: Within functional limits for tasks assessed     SENSATION: WFL  PALPATION: Min/ mod TTP about RT upper glute med/ piriformis   LUMBAR ROM:   AROM eval 06/12/22  Flexion [facility] WFL  Extension 75% limited 25% limited  Right lateral flexion [facility] WFL  Left lateral flexion [facility] WFL  Right rotation    Left rotation     (Blank rows = not tested)  LOWER EXTREMITY ROM:     AROM WFL   LOWER EXTREMITY MMT:    MMT Right eval Left eval Right 06/12/22 Left 06/12/22  Hip flexion 5 5 5 5  $ Hip extension 4 4- 4+ 4  Hip abduction 4 4 4+ 4  Hip adduction      Hip internal rotation      Hip external rotation      Knee flexion      Knee extension 5 4+ 5 5  Ankle dorsiflexion 5 5 5 5  $ Ankle plantarflexion      Ankle inversion      Ankle eversion       (Blank rows = not tested)  LUMBAR SPECIAL TESTS:  (-) sacral compression, (-) SLR    TODAY'S TREATMENT:  DATE:  06/19/22 Prone on elbows x 1' Prone press ups x 10 Hip extension x 10 each Plank elbows and knees 3 x 20" Plank on elbows 3 x 10" each Quadruped alternate arms x 10 Quadruped alternate legs x 10 Quadruped alternate arms and legs x 10  Standing: Hip abduction 2 x 10 Hip extension 2 x 10  Seated: Piriformis 3 x 20" Hamstring stretch 3 x 20"       06/12/22 Reassessment Press up 2 x 10 Prone hip extension 3x 10 bilateral  Plank on elbows and knees 3x 20 seconds 3 way hip slide outs 2x 5 bilateral   06/10/22 POE 2x 1  Press up 2 x 10  Prone hip extension 2x 10  bilateral  Quadruped hip extension 2 x 10 bilateral  Plank on elbows and knees 3x 20 seconds Lateral stepping with perpendicular resistance 3 plates 1 x 5 bilateral  3 way hip slide outs 1x 5 bilateral   06/03/22 Standing: Standing shoulder extension GTB 2 x 10  Shoulder row GTB 2x10 Paloff press GTB 2 x 10 bilateral  Vectors 10X3" with 1 UE assist Lateral stepping with GTB at ankles 2RT Seated  Quadruped hip extension with opposite UE flexion 2X10 Supine: Bridge 1 x 20  Roll in to [facility] with heels on red ball 2 x 10 with 5 second holds POE 2x 1 minute Press up 1 x 10   05/27/22 Bridge 1 x 20  DKTC with heels on red ball 2 x 10 with 5 second holds Dead bug isometric 2x 10 5 second holds POE 2x 1 minute Press up 1 x 10  Quadruped hip extension 1 x 10 Standing shoulder extension GTB 2 x 10  Standing shoulder row GTB 3x10 Palof press GTB 2 x 10 bilateral  Lateral stepping 6 x 10 feet RTB at knees    PATIENT EDUCATION:  Education details: Updated HEP Person educated: Patient Education method: Explanation Education comprehension: verbalized understanding  HOME EXERCISE PROGRAM:  06/19/22 plank, quadruped alternate arms and legs Access Code: [email or web address] URL: [email or web address] 06/10/22 - Plank on Knees  - 1 x daily - 7 x weekly - 3 reps - 20 second hold - Single Leg Balance with Four Way Reach and Rotation  - 1 x daily - 7 x weekly - 2 sets - 5 reps   Date: 05/22/2022 - Seated Hamstring Stretch  - 1-2 x daily - 5-7 x weekly - 3 reps - 30 hold - Supine Posterior Pelvic Tilt  - 1-2 x daily - 5-7 x weekly - 2 sets - 10 reps - 6 hold - Seated Piriformis Stretch  - 1-2 x daily - 5-7 x weekly - 3 reps - 30 hold - Clamshell with Resistance  - 1-2 x daily - 5-7 x weekly - 2 sets - 10 reps - Standing Hip Abduction with Counter Support  - 1-2 x daily - 5-7 x weekly - 2 sets - 10 reps - Standing Hip Extension with Counter Support  - 1-2 x daily - 5-7 x weekly - 2 sets -  10 reps  Date: 05/02/2022 Prepared by: Josue Hector Exercises - Supine Transversus Abdominis Bracing - Hands on Stomach  - 2 x daily - 7 x weekly - 1-2 sets - 10 reps - 5 second hold - Small Range Straight Leg Raise  - 2 x daily - 7 x weekly - 2 sets - 10 reps  Access Code: [email or web address] URL: [email or web address] Date: 05/15/2022 Prepared by: Roseanne Reno Exercises --  Hooklying Active Hamstring Stretch  - 2 x daily - 7 x weekly - 1 sets - 5 reps - 30 sec hold - Beginner Bridge  - 2 x daily - 7 x weekly - 2 sets - 10 reps - Sidelying Clamshell in Neutral  - 2 x daily - 7 x weekly - 1 sets - 10 reps - Sidelying Hip Abduction  - 2 x daily - 7 x weekly - 2 sets - 10 reps - Prone Hip Extension  - 2 x daily - 7 x weekly - 2 sets - 10 reps - Prone Press Up On Elbows  - 2 x daily - 7 x weekly - 1 sets - 5 reps - 1 minute hold  ASSESSMENT:  CLINICAL IMPRESSION:  Today's treatment continued to work on core strengthening.  Patient with some increased pain with standing exercise but no issue with progression of quadruped and plank.  Discussed aquatic exercise benefit with patient with verbalization of understanding.   Patient will continue to benefit from skilled physical therapy in order to improve function and reduce impairment.     OBJECTIVE IMPAIRMENTS: decreased activity tolerance, decreased mobility, difficulty walking, decreased ROM, decreased strength, increased fascial restrictions, impaired flexibility, improper body mechanics, and pain.   ACTIVITY LIMITATIONS: carrying, lifting, bending, sitting, standing, squatting, stairs, transfers, bed mobility, and locomotion level  PARTICIPATION LIMITATIONS: meal prep, cleaning, laundry, driving, shopping, community activity, occupation, and yard work  PERSONAL FACTORS: Time since onset of injury/illness/exacerbation are also affecting patient's functional outcome.   REHAB POTENTIAL: Good  CLINICAL DECISION MAKING:  Stable/uncomplicated  EVALUATION COMPLEXITY: Low   GOALS: SHORT TERM GOALS: Target date: 05/23/2022  Patient will be independent with initial HEP and self-management strategies to improve functional outcomes Baseline:  Goal status: MET    LONG TERM GOALS: Target date: 06/13/2022  Patient will be independent with advanced HEP and self-management strategies to improve functional outcomes Baseline:  Goal status: IN PROGRESS  2.  Patient will improve FOTO score to predicted value to indicate improvement in functional outcomes Baseline: 51% function 06/12/22: 52% function Goal status: IN PROGRESS  3.  Patient will report reduction of back pain to <3/10 with activity for improved quality of life and ability to perform ADLs  Baseline: 7-8/10 06/12/22 : 9/10 the other day Goal status: IN PROGRESS  4. Patient will have equal to or > 4+/5 MMT throughout BLE to improve ability to perform functional mobility, stair ambulation and ADLs.  Baseline: See MMT  Goal status: IN PROGRESS PLAN:  PT FREQUENCY: 1x/week  PT DURATION: 6 weeks  PLANNED INTERVENTIONS: Therapeutic exercises, Therapeutic activity, Neuromuscular re-education, Balance training, Gait training, Patient/Family education, Joint manipulation, Joint mobilization, Stair training, Aquatic Therapy, Dry Needling, Electrical stimulation, Spinal manipulation, Spinal mobilization, Cryotherapy, Moist heat, scar mobilization, Taping, Traction, Ultrasound, Biofeedback, Ionotophoresis 21m/ml Dexamethasone, and Manual therapy. .Marland Kitchen PLAN FOR NEXT SESSION: Continue POC and may progress as tolerated working on flexibility and core and hip strength  2:33 PM, 06/19/22 Craig Ionescu Small [place] MPT [facility] [place] #[phone]

## 2022-06-27 ENCOUNTER — Encounter (HOSPITAL_COMMUNITY): Payer: Medicare HMO | Admitting: Physical Therapy

## 2022-07-02 ENCOUNTER — Other Ambulatory Visit (HOSPITAL_COMMUNITY): Payer: Self-pay

## 2022-07-02 DIAGNOSIS — M3501 Sicca syndrome with keratoconjunctivitis: Secondary | ICD-10-CM | POA: Diagnosis not present

## 2022-07-02 DIAGNOSIS — M79646 Pain in unspecified finger(s): Secondary | ICD-10-CM | POA: Diagnosis not present

## 2022-07-02 DIAGNOSIS — R768 Other specified abnormal immunological findings in serum: Secondary | ICD-10-CM | POA: Diagnosis not present

## 2022-07-02 DIAGNOSIS — M359 Systemic involvement of connective tissue, unspecified: Secondary | ICD-10-CM | POA: Diagnosis not present

## 2022-07-02 MED ORDER — PREDNISONE 10 MG PO TABS
10.0000 mg | ORAL_TABLET | Freq: Every day | ORAL | 1 refills | Status: DC
  Filled 2022-07-02: qty 30, 30d supply, fill #0

## 2022-07-04 ENCOUNTER — Ambulatory Visit (HOSPITAL_COMMUNITY): Payer: Medicare HMO | Attending: Internal Medicine | Admitting: Physical Therapy

## 2022-07-04 DIAGNOSIS — M5459 Other low back pain: Secondary | ICD-10-CM | POA: Insufficient documentation

## 2022-07-04 NOTE — Therapy (Signed)
OUTPATIENT PHYSICAL THERAPY TREATMENT   Patient Name: Victoria Cooper MRN: [id number] DOB:1956/03/14, 67 y.o., female Today's Date: 07/04/2022    See note below for Objective Data and Assessment of Progress/Goals   END OF SESSION:  PT End of Session - 07/04/22 0915     Visit Number 10    Number of Visits 18    Date for PT Re-Evaluation 07/24/22    Authorization Type [employer]    Progress Note Due on Visit 18    PT Start Time 0905    PT Stop Time 0943    PT Time Calculation (min) 38 min    Activity Tolerance Patient tolerated treatment well    Behavior During Therapy [facility] for tasks assessed/performed              Past Medical History:  Diagnosis Date   Abnormal Pap smear of cervix 2006   Repeat Pap normal   Allergy    Animal bite 03/18/2011   Dog Bite   Arthritis    Asthma    Classical migraine with intractable migraine 11/30/2015   Connective tissue disease, undifferentiated ([place]) 2015   Dr. Amil Amen   Female hypogonadism syndrome 01/13/2019   GERD (gastroesophageal reflux disease)    Hyperlipidemia    Hypertension    Migraine    Vitamin D deficiency disease 01/13/2019   Wears glasses    Past Surgical History:  Procedure Laterality Date   ABDOMINAL HYSTERECTOMY  2003   hyst   BACK SURGERY  2009   lumb lam-fusion   Basil cell     BREAST REDUCTION SURGERY  05/26/2012   Procedure: MAMMARY REDUCTION  (BREAST);  Surgeon: Erline Hau, MD;  Location: [place];  Service: Plastics;  Laterality: Bilateral;   COLONOSCOPY  2003, 2018   diverticulosis   Patient Active Problem List   Diagnosis Date Noted   Female hypogonadism syndrome 01/13/2019   Vitamin D deficiency disease 01/13/2019   Classical migraine with intractable migraine 11/30/2015   Upper airway cough syndrome 04/07/2013   Essential hypertension, benign 04/07/2013   Extrinsic asthma, unspecified 03/05/2013   Animal bite 03/18/2011    PCP: Deland Pretty MD  REFERRING  PROVIDER: Vallarie Mare, MD  REFERRING DIAG: PT eval/tx for [phone] lumbar stenosis w/neurogenic claudication per Duffy Rhody, MD  Rationale for Evaluation and Treatment: Rehabilitation  THERAPY DIAG:  Other low back pain  ONSET DATE: April 2022   SUBJECTIVE:  SUBJECTIVE STATEMENT: Feeling better. No clicking in back for the first time in a while. She has been taking prednisone for a recent sickness. Today is day 3.   PERTINENT HISTORY:  Lumbar fusion 2007, HTN, non differentiated connective tissue disorder   PAIN:  Are you having pain? No and Yes: NPRS scale: 0/10 Pain location: low back, bilat thighs  Pain description: sharp, dull, aching  Aggravating factors: lumbar extension, lifting, carrying, prolonged standing   Relieving factors: laying, sitting, meds   PRECAUTIONS: None  WEIGHT BEARING RESTRICTIONS: No  FALLS:  Has patient fallen in last 6 months? 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 At L3-4 there is a mild broad-based disc bulge. Severe bilateral facet arthropathy. Severe spinal stenosis. Moderate right and mild left foraminal stenosis. 2. Posterior lumbar interbody fusion at L4-5 and L5-S1 without foraminal or central canal stenosis. 3. No acute osseous injury of the lumbar spine.  IMPRESSION: 1. L4-S1 PLIF with widely patent spinal canal and neural foramina. Solid arthrodesis. 2. Mild  L3-4 degenerative disc disease and facet arthrosis without stenosis.   Aortic Atherosclerosis (ICD10-I70.0).  PATIENT SURVEYS:  FOTO 51% function   06/12/22 : 52 % function  COGNITION: Overall cognitive status: Within functional limits for tasks assessed     SENSATION: WFL  PALPATION: Min/ mod TTP about RT upper glute med/ piriformis   LUMBAR ROM:   AROM eval 06/12/22  [facility] WFL  Extension 75% limited 25% limited  Right lateral flexion [facility] WFL  Left lateral flexion [facility] WFL  Right rotation    Left rotation     (Blank rows = not tested)  LOWER EXTREMITY ROM:     AROM WFL   LOWER EXTREMITY MMT:    MMT Right eval Left eval Right 06/12/22 Left 06/12/22  Hip flexion '5 5 5 5  '$ Hip extension 4 4- 4+ 4  Hip abduction 4 4 4+ 4  Hip adduction      Hip internal rotation      Hip external rotation      Knee flexion      Knee extension 5 4+ 5 5  Ankle dorsiflexion '5 5 5 5  '$ Ankle plantarflexion      Ankle inversion      Ankle eversion       (Blank rows = not tested)  LUMBAR SPECIAL TESTS:  (-) sacral compression, (-) SLR    TODAY'S TREATMENT:  DATE:   07/04/22 Prone on elbows x 1' Prone press ups x 10 Front plank 3 x 20" Quadruped alternate legs x 10 each  Quadruped birddog x 10 each   Bridge x20 SLR with ab brace x20 each Dead bug 2 x 10  07-13-2022 Prone on elbows x 1' Prone press ups x 10 Hip extension x 10 each Plank elbows and knees 3 x 20" Plank on elbows 3 x 10" each Quadruped alternate arms x 10 Quadruped alternate legs x 10 Quadruped alternate arms and legs x 10  Standing: Hip abduction 2 x 10 Hip extension 2 x 10  Seated: Piriformis 3 x 20" Hamstring stretch 3 x 20"   06/12/22 Reassessment Press up 2 x 10 Prone hip extension 3x 10 bilateral  Plank on elbows and knees 3x 20 seconds 3 way hip slide outs 2x 5  bilateral     PATIENT EDUCATION:  Education details: Updated HEP Person educated: Patient Education method: Explanation Education comprehension: verbalized understanding  HOME EXERCISE PROGRAM:  13-Jul-2022 plank, quadruped alternate arms and legs Access Code: [email or web address] URL: [email or web address]  07/04/22 - Side Plank on Knees  - 2 x daily - 7 x weekly - 1 sets - 3 reps - 20 hold - Dead Bug  - 2 x daily - 7 x weekly - 2 sets - 10 reps  06/10/22 - Plank on Knees  - 1 x daily - 7 x weekly - 3 reps - 20 second hold - Single Leg Balance with Four Way Reach and Rotation  - 1 x daily - 7 x weekly - 2 sets - 5 reps   Date: 05/22/2022 - Seated Hamstring Stretch  - 1-2 x daily - 5-7 x weekly - 3 reps - 30 hold - Supine Posterior Pelvic Tilt  - 1-2 x daily - 5-7 x weekly - 2 sets - 10 reps - 6 hold - Seated Piriformis Stretch  - 1-2 x daily - 5-7 x weekly - 3 reps - 30 hold - Clamshell with Resistance  - 1-2 x daily - 5-7 x weekly - 2 sets - 10 reps - Standing Hip Abduction with Counter Support  - 1-2 x daily - 5-7 x weekly - 2 sets - 10 reps - Standing Hip Extension with Counter Support  - 1-2 x daily - 5-7 x weekly - 2 sets - 10 reps  Date: 05/02/2022 Prepared by: Josue Hector Exercises - Supine Transversus Abdominis Bracing - Hands on Stomach  - 2 x daily - 7 x weekly - 1-2 sets - 10 reps - 5 second hold - Small Range Straight Leg Raise  - 2 x daily - 7 x weekly - 2 sets - 10 reps  Access Code: [email or web address] URL: [email or web address] Date: 05/15/2022 Prepared by: Roseanne Reno Exercises -- Hooklying Active Hamstring Stretch  - 2 x daily - 7 x weekly - 1 sets - 5 reps - 30 sec hold - Beginner Bridge  - 2 x daily - 7 x weekly - 2 sets - 10 reps - Sidelying Clamshell in Neutral  - 2 x daily - 7 x weekly - 1 sets - 10 reps - Sidelying Hip Abduction  - 2 x daily - 7 x weekly - 2 sets - 10 reps - Prone Hip Extension  - 2 x daily - 7 x weekly - 2 sets - 10 reps -  Prone Press Up On Elbows  - 2 x daily - 7 x weekly - 1 sets - 5 reps - 1  minute hold  ASSESSMENT:  CLINICAL IMPRESSION: Patein tolerated session well today. Demos appropriate level of fatigue with activity. Patient was challenged with sequencing of dead bug exercise, but able to improve form with verbal cues. Patient also cued on leveling back and hips during birddogs due to decreased proprioception and ongoing core weakness. Patient will continue to benefit from skilled therapy services to reduce remaining deficits and improve functional ability.    OBJECTIVE IMPAIRMENTS: decreased activity tolerance, decreased mobility, difficulty walking, decreased ROM, decreased strength, increased fascial restrictions, impaired flexibility, improper body mechanics, and pain.   ACTIVITY LIMITATIONS: carrying, lifting, bending, sitting, standing, squatting, stairs, transfers, bed mobility, and locomotion level  PARTICIPATION LIMITATIONS: meal prep, cleaning, laundry, driving, shopping, community activity, occupation, and yard work  PERSONAL FACTORS: Time since onset of injury/illness/exacerbation are also affecting patient's functional outcome.   REHAB POTENTIAL: Good  CLINICAL DECISION MAKING: Stable/uncomplicated  EVALUATION COMPLEXITY: Low   GOALS: SHORT TERM GOALS: Target date: 05/23/2022  Patient will be independent with initial HEP and self-management strategies to improve functional outcomes Baseline:  Goal status: MET    LONG TERM GOALS: Target date: 06/13/2022  Patient will be independent with advanced HEP and self-management strategies to improve functional outcomes Baseline:  Goal status: IN PROGRESS  2.  Patient will improve FOTO score to predicted value to indicate improvement in functional outcomes Baseline: 51% function 06/12/22: 52% function Goal status: IN PROGRESS  3.  Patient will report reduction of back pain to <3/10 with activity for improved quality of life and ability  to perform ADLs  Baseline: 7-8/10 06/12/22 : 9/10 the other day Goal status: IN PROGRESS  4. Patient will have equal to or > 4+/5 MMT throughout BLE to improve ability to perform functional mobility, stair ambulation and ADLs.  Baseline: See MMT  Goal status: IN PROGRESS PLAN:  PT FREQUENCY: 1x/week  PT DURATION: 6 weeks  PLANNED INTERVENTIONS: Therapeutic exercises, Therapeutic activity, Neuromuscular re-education, Balance training, Gait training, Patient/Family education, Joint manipulation, Joint mobilization, Stair training, Aquatic Therapy, Dry Needling, Electrical stimulation, Spinal manipulation, Spinal mobilization, Cryotherapy, Moist heat, scar mobilization, Taping, Traction, Ultrasound, Biofeedback, Ionotophoresis '4mg'$ /ml Dexamethasone, and Manual therapy. Marland Kitchen  PLAN FOR NEXT SESSION: Continue POC and may progress as tolerated working on flexibility and core and hip strength   9:46 AM, 07/04/22 Josue Hector PT DPT  Physical Therapist with [facility]  [phone]

## 2022-07-08 ENCOUNTER — Encounter (HOSPITAL_COMMUNITY): Payer: Medicare HMO | Admitting: Physical Therapy

## 2022-07-08 DIAGNOSIS — Z6830 Body mass index (BMI) 30.0-30.9, adult: Secondary | ICD-10-CM | POA: Diagnosis not present

## 2022-07-08 DIAGNOSIS — M48062 Spinal stenosis, lumbar region with neurogenic claudication: Secondary | ICD-10-CM | POA: Diagnosis not present

## 2022-07-08 NOTE — Progress Notes (Deleted)
67 y.o. G62P3003 Married Caucasian female here for annual exam.    PCP:     Patient's last menstrual period was 04/29/2001 (lmp unknown).           Sexually active: {yes no:314532}  The current method of family planning is status post hysterectomy.    Exercising: {yes no:314532}  {types:19826} Smoker:  no  Health Maintenance: Pap:  2012 neg History of abnormal Pap:  yes, repeat pap normal MMG:  01/07/22 Breast Density Category B, BI-RADS CATEGORY 1 neg Colonoscopy:  2018 normal BMD:   01/07/22   Result  osteopenic TDaP:  07/19/21 Gardasil:   no HIV: 07/18/16 neg Hep C: 07/18/16 neg Screening Labs:  Hb today: ***, Urine today: ***   reports that she has never smoked. She has never used smokeless tobacco. She reports current alcohol use. She reports that she does not use drugs.  Past Medical History:  Diagnosis Date   Abnormal Pap smear of cervix 2006   Repeat Pap normal   Allergy    Animal bite 03/18/2011   Dog Bite   Arthritis    Asthma    Classical migraine with intractable migraine 11/30/2015   Connective tissue disease, undifferentiated ([place]) 2015   Dr. Amil Amen   Female hypogonadism syndrome 01/13/2019   GERD (gastroesophageal reflux disease)    Hyperlipidemia    Hypertension    Migraine    Vitamin D deficiency disease 01/13/2019   Wears glasses     Past Surgical History:  Procedure Laterality Date   ABDOMINAL HYSTERECTOMY  2003   hyst   BACK SURGERY  2009   lumb lam-fusion   Basil cell     BREAST REDUCTION SURGERY  05/26/2012   Procedure: MAMMARY REDUCTION  (BREAST);  Surgeon: Erline Hau, MD;  Location: [place];  Service: Plastics;  Laterality: Bilateral;   COLONOSCOPY  2003, 2018   diverticulosis    Current Outpatient Medications  Medication Sig Dispense Refill   Cholecalciferol (VITAMIN D-3) 125 MCG (5000 UT) TABS Take 10,000 Units by mouth daily.     estradiol (ESTRACE) 0.5 MG tablet Take 1 tablet (0.5 mg total) by mouth daily. 90  tablet 6   gabapentin (NEURONTIN) 300 MG capsule Take 1 capsule (300 mg total) by mouth 3 (three) times daily. 60 capsule 3   hydroxychloroquine (PLAQUENIL) 200 MG tablet Take 1 tablet (200 mg total) by mouth 2 (two) times daily with food or milk. 60 tablet 1   hydroxychloroquine (PLAQUENIL) 200 MG tablet Take 2 tablets (400 mg total) by mouth daily. 180 tablet 0   meloxicam (MOBIC) 15 MG tablet Take 1 tablet (15 mg total) by mouth daily. 40 tablet 0   NONFORMULARY OR COMPOUNDED ITEM Inject 5 mg into the muscle every 7 (seven) days. Testosterone cypionate in olive  oil (25 mg/mL).  Dispense 5 mL vial. 1 each 3   predniSONE (DELTASONE) 10 MG tablet Take 1 tablet (10 mg total) by mouth daily. 30 tablet 1   progesterone (PROMETRIUM) 200 MG capsule Take 1 capsule (200 mg total) by mouth every evening. 90 capsule 1   Rimegepant Sulfate (NURTEC) 75 MG TBDP Dissolve 75 mg by mouth as needed (for migraine). Max dose 1 tablet in 24 hours 8 tablet 6   rosuvastatin (CRESTOR) 20 MG tablet Take 1 tablet by mouth daily 90 tablet 3   thyroid (ARMOUR) 90 MG tablet Take 90 mg by mouth daily. (Patient not taking: Reported on 01/30/2022)     traZODone (DESYREL) 150  MG tablet Take 1 tablet (150 mg total) by mouth at bedtime. 90 tablet 3   valsartan-hydrochlorothiazide (DIOVAN-HCT) 160-12.5 MG tablet TAKE 1 TABLET BY MOUTH ONCE DAILY 90 tablet 3   No current facility-administered medications for this visit.    Family History  Problem Relation Age of Onset   Heart disease Father    Lung cancer Mother        never smoker   ALS Maternal Grandfather    Melanoma Paternal Grandmother    Brain cancer Paternal Grandmother    Stroke Paternal Grandfather    Heart failure Maternal Grandmother    Ulcerative colitis Brother    Migraines Neg Hx    Colon cancer Neg Hx     Review of Systems  Exam:   LMP 04/29/2001 (LMP Unknown)     General appearance: alert, cooperative and appears stated age Head: normocephalic,  without obvious abnormality, atraumatic Neck: no adenopathy, supple, symmetrical, trachea midline and thyroid normal to inspection and palpation Lungs: clear to auscultation bilaterally Breasts: normal appearance, no masses or tenderness, No nipple retraction or dimpling, No nipple discharge or bleeding, No axillary adenopathy Heart: regular rate and rhythm Abdomen: soft, non-tender; no masses, no organomegaly Extremities: extremities normal, atraumatic, no cyanosis or edema Skin: skin color, texture, turgor normal. No rashes or lesions Lymph nodes: cervical, supraclavicular, and axillary nodes normal. Neurologic: grossly normal  Pelvic: External genitalia:  no lesions              No abnormal inguinal nodes palpated.              Urethra:  normal appearing urethra with no masses, tenderness or lesions              Bartholins and Skenes: normal                 Vagina: normal appearing vagina with normal color and discharge, no lesions              Cervix: no lesions              Pap taken: {yes no:314532} Bimanual Exam:  Uterus:  normal size, contour, position, consistency, mobility, non-tender              Adnexa: no mass, fullness, tenderness              Rectal exam: {yes no:314532}.  Confirms.              Anus:  normal sphincter tone, no lesions  Chaperone was present for exam:  ***  Assessment:   Well woman visit with gynecologic exam.   Plan: Mammogram screening discussed. Self breast awareness reviewed. Pap and HR HPV as above. Guidelines for Calcium, Vitamin D, regular exercise program including cardiovascular and weight bearing exercise.   Follow up annually and prn.   Additional counseling given.  {yes [id number]. _______ minutes face to face time of which over 50% was spent in counseling.    After visit summary provided.

## 2022-07-15 ENCOUNTER — Ambulatory Visit (HOSPITAL_COMMUNITY): Payer: Medicare HMO | Admitting: Physical Therapy

## 2022-07-15 DIAGNOSIS — M5459 Other low back pain: Secondary | ICD-10-CM | POA: Diagnosis not present

## 2022-07-15 NOTE — Therapy (Signed)
OUTPATIENT PHYSICAL THERAPY TREATMENT   Patient Name: Victoria Cooper MRN: [id number] DOB:Apr 22, 1956, 67 y.o., female Today's Date: 07/15/2022    See note below for Objective Data and Assessment of Progress/Goals   END OF SESSION:  PT End of Session - 07/15/22 1035     Visit Number 11    Number of Visits 18    Date for PT Re-Evaluation 07/24/22    Authorization Type [employer] Medicare    Progress Note Due on Visit 18    PT Start Time 1035    PT Stop Time 1113    PT Time Calculation (min) 38 min    Activity Tolerance Patient tolerated treatment well    Behavior During Therapy [facility] for tasks assessed/performed              Past Medical History:  Diagnosis Date   Abnormal Pap smear of cervix 2006   Repeat Pap normal   Allergy    Animal bite 03/18/2011   Dog Bite   Arthritis    Asthma    Classical migraine with intractable migraine 11/30/2015   Connective tissue disease, undifferentiated ([place]) 2015   Dr. Amil Amen   Female hypogonadism syndrome 01/13/2019   GERD (gastroesophageal reflux disease)    Hyperlipidemia    Hypertension    Migraine    Vitamin D deficiency disease 01/13/2019   Wears glasses    Past Surgical History:  Procedure Laterality Date   ABDOMINAL HYSTERECTOMY  2003   hyst   BACK SURGERY  2009   lumb lam-fusion   Basil cell     BREAST REDUCTION SURGERY  05/26/2012   Procedure: MAMMARY REDUCTION  (BREAST);  Surgeon: Erline Hau, MD;  Location: [place];  Service: Plastics;  Laterality: Bilateral;   COLONOSCOPY  2003, 2018   diverticulosis   Patient Active Problem List   Diagnosis Date Noted   Female hypogonadism syndrome 01/13/2019   Vitamin D deficiency disease 01/13/2019   Classical migraine with intractable migraine 11/30/2015   Upper airway cough syndrome 04/07/2013   Essential hypertension, benign 04/07/2013   Extrinsic asthma, unspecified 03/05/2013   Animal bite 03/18/2011    PCP: Deland Pretty MD  REFERRING  PROVIDER: Vallarie Mare, MD  REFERRING DIAG: PT eval/tx for [phone] lumbar stenosis w/neurogenic claudication per Duffy Rhody, MD  Rationale for Evaluation and Treatment: Rehabilitation  THERAPY DIAG:  Other low back pain  ONSET DATE: April 2022   SUBJECTIVE:  SUBJECTIVE STATEMENT: Went for a 20 minute walk and still feels something not right about back/ legs. Legs feel heavy after awhile. "Spotty" with HEP completion.   PERTINENT HISTORY:  Lumbar fusion 2007, HTN, non differentiated connective tissue disorder   PAIN:  Are you having pain? No and Yes: NPRS scale: 0/10 Pain location: low back, bilat thighs  Pain description: sharp, dull, aching  Aggravating factors: lumbar extension, lifting, carrying, prolonged standing   Relieving factors: laying, sitting, meds   PRECAUTIONS: None  WEIGHT BEARING RESTRICTIONS: No  FALLS:  Has patient fallen in last 6 months? 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 At L3-4 there is a mild broad-based disc bulge. Severe bilateral facet arthropathy. Severe spinal stenosis. Moderate right and mild left foraminal stenosis. 2. Posterior lumbar interbody fusion at L4-5 and L5-S1 without foraminal or central canal stenosis. 3. No acute osseous injury of the lumbar spine.  IMPRESSION: 1. L4-S1 PLIF with widely patent spinal canal and neural foramina. Solid arthrodesis. 2.  Mild L3-4 degenerative disc disease and facet arthrosis without stenosis.   Aortic Atherosclerosis (ICD10-I70.0).  PATIENT SURVEYS:  FOTO 51% function   06/12/22 : 52 % function  COGNITION: Overall cognitive status: Within functional limits for tasks assessed     SENSATION: WFL  PALPATION: Min/ mod TTP about RT upper glute med/ piriformis   LUMBAR ROM:   AROM eval 06/12/22  Flexion [facility] WFL  Extension 75% limited 25% limited  Right lateral flexion [facility] WFL  Left lateral flexion [facility] WFL  Right rotation    Left rotation     (Blank rows = not tested)  LOWER EXTREMITY ROM:     AROM WFL   LOWER EXTREMITY MMT:    MMT Right eval Left eval Right 06/12/22 Left 06/12/22  Hip flexion 5 5 5 5   Hip extension 4 4- 4+ 4  Hip abduction 4 4 4+ 4  Hip adduction      Hip internal rotation      Hip external rotation      Knee flexion      Knee extension 5 4+ 5 5  Ankle dorsiflexion 5 5 5 5   Ankle plantarflexion      Ankle inversion      Ankle eversion       (Blank rows = not tested)  LUMBAR SPECIAL TESTS:  (-) sacral compression, (-) SLR    TODAY'S TREATMENT:  DATE:  07/15/22 SKTC 10 x5" DKTC 10 x 10"  LTR with knee extension (glute stretch) 5 x 10"  Front plank 3 x 20" Side plank 3 x 20" each   Quadruped alternate legs x 10 each  Quadruped birddog x 10 each   Bridge 20 x 5"  Dead bug 2 x 10  07/22/22 Prone on elbows x 1' Prone press ups x 10 Front plank 3 x 20" Quadruped alternate legs x 10 each  Quadruped birddog x 10 each   Bridge x20 SLR with ab brace x20 each Dead bug 2 x 10  07/07/22 Prone on elbows x 1' Prone press ups x 10 Hip extension x 10 each Plank elbows and knees 3 x 20" Plank on elbows 3 x 10" each Quadruped alternate arms x 10 Quadruped alternate legs x 10 Quadruped alternate arms and legs x 10  Standing: Hip  abduction 2 x 10 Hip extension 2 x 10  Seated: Piriformis 3 x 20" Hamstring stretch 3 x 20"   06/12/22 Reassessment Press up 2 x 10 Prone hip extension 3x 10 bilateral  Plank on elbows and knees 3x 20 seconds 3 way hip slide outs 2x 5 bilateral     PATIENT EDUCATION:  Education details: Updated HEP Person educated: Patient Education method: Explanation Education comprehension: verbalized understanding  HOME EXERCISE PROGRAM:  2022-07-07 plank, quadruped alternate arms and legs Access Code: [email or web address] URL: [email or web address]  07/15/22  Access Code: [email or web address] URL: [email or web address] Date: 07/15/2022 Prepared by: Josue Hector  Exercises - Prone Press Up  - 1-2 x daily - 7 x weekly - 1-2 sets - 10 reps - Supine Transversus Abdominis Bracing - Hands on Stomach  - 1-2 x daily - 7 x weekly - 1-2 sets - 10 reps - 5 second hold - Small Range Straight Leg Raise  - 1-2 x daily - 7 x weekly - 2 sets - 10 reps - Hooklying Active Hamstring Stretch  - 1-2 x daily - 7 x weekly - 1 sets - 5 reps - 30 sec hold - Supine Double Knee to Chest  - 1-2 x daily - 7 x weekly - 1 sets - 10 reps - 10 second hold - Supine Gluteus Stretch  - 1-2 x daily - 7 x weekly - 1 sets - 10 reps - 10 second hold - Beginner Bridge  - 1-2 x daily - 7 x weekly - 2 sets - 10 reps - Supine Posterior Pelvic Tilt  - 1-2 x daily - 5-7 x weekly - 2 sets - 10 reps - 6 hold - Dead Bug  - 1-2 x daily - 7 x weekly - 2 sets - 10 reps - Seated Piriformis Stretch  - 1-2 x daily - 5-7 x weekly - 3 reps - 30 hold - Bird Dog  - 1-2 x daily - 7 x weekly - 1 sets - 10 reps - Standard Plank  - 1-2 x daily - 7 x weekly - 1 sets - 3 reps - 15 sec hold - Side Plank on Knees  - 1-2 x daily - 7 x weekly - 1 sets - 3 reps - 20 hold - Single Leg Balance with Four Way Reach and Rotation  - 1-2 x daily - 7 x weekly - 2 sets - 5 reps - Hip Abduction with Resistance Loop  - 1-2 x daily - 7 x weekly - 2 sets -  10 reps - Hip Extension with Resistance Loop  - 1-2 x daily -  7 x weekly - 2 sets - 10 reps   07/04/22 - Side Plank on Knees  - 2 x daily - 7 x weekly - 1 sets - 3 reps - 20 hold - Dead Bug  - 2 x daily - 7 x weekly - 2 sets - 10 reps  06/10/22 - Plank on Knees  - 1 x daily - 7 x weekly - 3 reps - 20 second hold - Single Leg Balance with Four Way Reach and Rotation  - 1 x daily - 7 x weekly - 2 sets - 5 reps   Date: 05/22/2022 - Seated Hamstring Stretch  - 1-2 x daily - 5-7 x weekly - 3 reps - 30 hold - Supine Posterior Pelvic Tilt  - 1-2 x daily - 5-7 x weekly - 2 sets - 10 reps - 6 hold - Seated Piriformis Stretch  - 1-2 x daily - 5-7 x weekly - 3 reps - 30 hold - Clamshell with Resistance  - 1-2 x daily - 5-7 x weekly - 2 sets - 10 reps - Standing Hip Abduction with Counter Support  - 1-2 x daily - 5-7 x weekly - 2 sets - 10 reps - Standing Hip Extension with Counter Support  - 1-2 x daily - 5-7 x weekly - 2 sets - 10 reps  Date: 05/02/2022 Prepared by: Josue Hector Exercises - Supine Transversus Abdominis Bracing - Hands on Stomach  - 2 x daily - 7 x weekly - 1-2 sets - 10 reps - 5 second hold - Small Range Straight Leg Raise  - 2 x daily - 7 x weekly - 2 sets - 10 reps  Access Code: [email or web address] URL: [email or web address] Date: 05/15/2022 Prepared by: Roseanne Reno Exercises -- Hooklying Active Hamstring Stretch  - 2 x daily - 7 x weekly - 1 sets - 5 reps - 30 sec hold - Beginner Bridge  - 2 x daily - 7 x weekly - 2 sets - 10 reps - Sidelying Clamshell in Neutral  - 2 x daily - 7 x weekly - 1 sets - 10 reps - Sidelying Hip Abduction  - 2 x daily - 7 x weekly - 2 sets - 10 reps - Prone Hip Extension  - 2 x daily - 7 x weekly - 2 sets - 10 reps - Prone Press Up On Elbows  - 2 x daily - 7 x weekly - 1 sets - 5 reps - 1 minute hold  ASSESSMENT:  CLINICAL IMPRESSION: Patient tolerated session well today. Noting decreased lumbar tension following DKTC stretching. Shows  improved form and sequencing with dead bugs today, but did require verbal cues for foot placement. Reviewed and updated comprehensive HEP and issued handout. Patient will continue to benefit from skilled therapy services to reduce remaining deficits and improve functional ability.    OBJECTIVE IMPAIRMENTS: decreased activity tolerance, decreased mobility, difficulty walking, decreased ROM, decreased strength, increased fascial restrictions, impaired flexibility, improper body mechanics, and pain.   ACTIVITY LIMITATIONS: carrying, lifting, bending, sitting, standing, squatting, stairs, transfers, bed mobility, and locomotion level  PARTICIPATION LIMITATIONS: meal prep, cleaning, laundry, driving, shopping, community activity, occupation, and yard work  PERSONAL FACTORS: Time since onset of injury/illness/exacerbation are also affecting patient's functional outcome.   REHAB POTENTIAL: Good  CLINICAL DECISION MAKING: Stable/uncomplicated  EVALUATION COMPLEXITY: Low   GOALS: SHORT TERM GOALS: Target date: 05/23/2022  Patient will be independent with initial HEP and self-management strategies to improve functional outcomes Baseline:  Goal status: MET  LONG TERM GOALS: Target date: 06/13/2022  Patient will be independent with advanced HEP and self-management strategies to improve functional outcomes Baseline:  Goal status: IN PROGRESS  2.  Patient will improve FOTO score to predicted value to indicate improvement in functional outcomes Baseline: 51% function 06/12/22: 52% function Goal status: IN PROGRESS  3.  Patient will report reduction of back pain to <3/10 with activity for improved quality of life and ability to perform ADLs  Baseline: 7-8/10 06/12/22 : 9/10 the other day Goal status: IN PROGRESS  4. Patient will have equal to or > 4+/5 MMT throughout BLE to improve ability to perform functional mobility, stair ambulation and ADLs.  Baseline: See MMT  Goal status: IN  PROGRESS PLAN:  PT FREQUENCY: 1x/week  PT DURATION: 6 weeks  PLANNED INTERVENTIONS: Therapeutic exercises, Therapeutic activity, Neuromuscular re-education, Balance training, Gait training, Patient/Family education, Joint manipulation, Joint mobilization, Stair training, Aquatic Therapy, Dry Needling, Electrical stimulation, Spinal manipulation, Spinal mobilization, Cryotherapy, Moist heat, scar mobilization, Taping, Traction, Ultrasound, Biofeedback, Ionotophoresis 4mg /ml Dexamethasone, and Manual therapy. Marland Kitchen  PLAN FOR NEXT SESSION: Continue POC and may progress as tolerated working on flexibility and core and hip strength   11:15 AM, 07/15/22 Josue Hector PT DPT  Physical Therapist with [facility]  [phone]

## 2022-07-17 ENCOUNTER — Other Ambulatory Visit: Payer: Self-pay

## 2022-07-22 ENCOUNTER — Ambulatory Visit: Payer: 59 | Admitting: Obstetrics and Gynecology

## 2022-07-22 DIAGNOSIS — M79671 Pain in right foot: Secondary | ICD-10-CM | POA: Diagnosis not present

## 2022-07-23 ENCOUNTER — Encounter (HOSPITAL_COMMUNITY): Payer: Medicare HMO | Admitting: Physical Therapy

## 2022-07-24 ENCOUNTER — Encounter: Payer: Self-pay | Admitting: Emergency Medicine

## 2022-07-24 ENCOUNTER — Ambulatory Visit
Admission: EM | Admit: 2022-07-24 | Discharge: 2022-07-24 | Disposition: A | Discharge status: Home / Self Care | Payer: Medicare HMO | Attending: Family Medicine | Admitting: Family Medicine

## 2022-07-24 DIAGNOSIS — S61112A Laceration without foreign body of left thumb with damage to nail, initial encounter: Secondary | ICD-10-CM

## 2022-07-24 MED ORDER — CEPHALEXIN 500 MG PO CAPS: 500.0000 mg | ORAL_CAPSULE | Freq: Two times a day (BID) | ORAL | 0 refills | Status: DC

## 2022-07-24 MED ORDER — CHLORHEXIDINE GLUCONATE 4 % EX LIQD: Freq: Every day | CUTANEOUS | 0 refills | Status: DC | PRN

## 2022-07-24 MED ORDER — MUPIROCIN 2 % EX OINT: 1.0000 | TOPICAL_OINTMENT | Freq: Two times a day (BID) | CUTANEOUS | 0 refills | Status: DC

## 2022-07-24 NOTE — Discharge Instructions (Signed)
Clean the area once to twice daily with Hibiclens and apply mupirocin ointment.  Apply a nonstick gauze pad and wrap the area in Coban wrap to keep the dressing in place.  Take the course of antibiotics as a precaution to keep the area from getting infected.  Elevate at rest to help with swelling.  Follow-up if area is becoming red, swollen, draining thick drainage.  Your tetanus shot is up-to-date based on your records as of March 2023.

## 2022-07-24 NOTE — ED Triage Notes (Signed)
Unsure of last tetanus shot.

## 2022-07-24 NOTE — ED Triage Notes (Signed)
Cut left thumb tonight while cutting sweet potatoes.

## 2022-07-26 NOTE — ED Provider Notes (Signed)
[facility]    CSN: [id number] Arrival date & time: 07/24/22  1821      History   Chief Complaint No chief complaint on file.   HPI Victoria Cooper is a 67 y.o. female.   Presenting today with a laceration to the tip of left thumb that occurred this evening while cutting sweet potatoes with a knife.  She denies decreased range of motion, numbness, tingling, weakness but does have some uncontrolled bleeding despite pressure applied.  So far has not cleaned it with anything.  Last tetanus shot was in 2023.    Past Medical History:  Diagnosis Date   Abnormal Pap smear of cervix 2006   Repeat Pap normal   Allergy    Animal bite 03/18/2011   Dog Bite   Arthritis    Asthma    Classical migraine with intractable migraine 11/30/2015   Connective tissue disease, undifferentiated ([place]) 2015   Dr. Amil Amen   Female hypogonadism syndrome 01/13/2019   GERD (gastroesophageal reflux disease)    Hyperlipidemia    Hypertension    Migraine    Vitamin D deficiency disease 01/13/2019   Wears glasses     Patient Active Problem List   Diagnosis Date Noted   Female hypogonadism syndrome 01/13/2019   Vitamin D deficiency disease 01/13/2019   Classical migraine with intractable migraine 11/30/2015   Upper airway cough syndrome 04/07/2013   Essential hypertension, benign 04/07/2013   Extrinsic asthma, unspecified 03/05/2013   Animal bite 03/18/2011    Past Surgical History:  Procedure Laterality Date   ABDOMINAL HYSTERECTOMY  2003   hyst   BACK SURGERY  2009   lumb lam-fusion   Basil cell     BREAST REDUCTION SURGERY  05/26/2012   Procedure: MAMMARY REDUCTION  (BREAST);  Surgeon: Erline Hau, MD;  Location: [place];  Service: Plastics;  Laterality: Bilateral;   COLONOSCOPY  2003, 2018   diverticulosis    OB History     Gravida  3   Para  3   Term  3   Preterm      AB      Living  3      SAB      IAB      Ectopic       Multiple      Live Births               Home Medications    Prior to Admission medications   Medication Sig Start Date End Date Taking? Authorizing Provider  cephALEXin (KEFLEX) 500 MG capsule Take 1 capsule (500 mg total) by mouth 2 (two) times daily. 07/24/22  Yes Volney American, PA-C  chlorhexidine (HIBICLENS) 4 % external liquid Apply topically daily as needed. 07/24/22  Yes Volney American, PA-C  mupirocin ointment (BACTROBAN) 2 % Apply 1 Application topically 2 (two) times daily. 07/24/22  Yes Volney American, PA-C  Cholecalciferol (VITAMIN D-3) 125 MCG (5000 UT) TABS Take 10,000 Units by mouth daily.    [provider]  estradiol (ESTRACE) 0.5 MG tablet Take 1 tablet (0.5 mg total) by mouth daily. 01/22/22     gabapentin (NEURONTIN) 300 MG capsule Take 1 capsule (300 mg total) by mouth 3 (three) times daily. 03/31/22     hydroxychloroquine (PLAQUENIL) 200 MG tablet Take 1 tablet (200 mg total) by mouth 2 (two) times daily with food or milk. 03/26/22     hydroxychloroquine (PLAQUENIL) 200 MG tablet Take 2 tablets (400  mg total) by mouth daily. 03/26/22     meloxicam (MOBIC) 15 MG tablet Take 1 tablet (15 mg total) by mouth daily. 04/23/22     NONFORMULARY OR COMPOUNDED ITEM Inject 5 mg into the muscle every 7 (seven) days. Testosterone cypionate in olive  oil (25 mg/mL).  Dispense 5 mL vial. 08/22/20   Gosrani, Nimish C, MD  predniSONE (DELTASONE) 10 MG tablet Take 1 tablet (10 mg total) by mouth daily. 07/02/22     progesterone (PROMETRIUM) 200 MG capsule Take 1 capsule (200 mg total) by mouth every evening. 01/22/22     Rimegepant Sulfate (NURTEC) 75 MG TBDP Dissolve 75 mg by mouth as needed (for migraine). Max dose 1 tablet in 24 hours 01/30/22   Genia Harold, MD  rosuvastatin (CRESTOR) 20 MG tablet Take 1 tablet by mouth daily 01/22/22     thyroid (ARMOUR) 90 MG tablet Take 90 mg by mouth daily. Patient not taking: Reported on 01/30/2022    [provider]  traZODone (DESYREL) 150 MG tablet Take 1 tablet (150 mg total) by mouth at bedtime. 12/10/21     valsartan-hydrochlorothiazide (DIOVAN-HCT) 160-12.5 MG tablet TAKE 1 TABLET BY MOUTH ONCE DAILY 10/29/21       Family History Family History  Problem Relation Age of Onset   Heart disease Father    Lung cancer Mother        never smoker   ALS Maternal Grandfather    Melanoma Paternal Grandmother    Brain cancer Paternal Grandmother    Stroke Paternal Grandfather    Heart failure Maternal Grandmother    Ulcerative colitis Brother    Migraines Neg Hx    Colon cancer Neg Hx     Social History Social History   Tobacco Use   Smoking status: Never   Smokeless tobacco: Never  Vaping Use   Vaping Use: Never used  Substance Use Topics   Alcohol use: Yes    Alcohol/week: 0.0 - 2.0 standard drinks of alcohol   Drug use: No     Allergies   Morphine and related, Sulfa antibiotics, and Topamax [topiramate]   Review of Systems Review of Systems PER HPI  Physical Exam Triage Vital Signs ED Triage Vitals  Enc Vitals Group     BP 07/24/22 1852 (!) 181/100     Pulse Rate 07/24/22 1852 95     Resp 07/24/22 1852 18     Temp 07/24/22 1852 98 F (36.7 C)     Temp Source 07/24/22 1852 Oral     SpO2 07/24/22 1852 95 %     Weight --      Height --      Head Circumference --      Peak Flow --      Pain Score 07/24/22 1850 5     Pain Loc --      Pain Edu? --      Excl. in [place]? --    No data found.  Updated Vital Signs BP 139/77 (BP Location: Right Arm)   Pulse 95   Temp 98 F (36.7 C) (Oral)   Resp 18   LMP 04/29/2001 (LMP Unknown)   SpO2 95%   Visual Acuity Right Eye Distance:   Left Eye Distance:   Bilateral Distance:    Right Eye Near:   Left Eye Near:    Bilateral Near:     Physical Exam Vitals and nursing note reviewed.  Constitutional:      Appearance: Normal appearance. She  is not ill-appearing.  HENT:     Head: Atraumatic.  Eyes:      Extraocular Movements: Extraocular movements intact.     Conjunctiva/sclera: Conjunctivae normal.  Cardiovascular:     Rate and Rhythm: Normal rate and regular rhythm.     Heart sounds: Normal heart sounds.  Pulmonary:     Effort: Pulmonary effort is normal.     Breath sounds: Normal breath sounds.  Musculoskeletal:        General: Tenderness and signs of injury present. No swelling or deformity. Normal range of motion.     Cervical back: Normal range of motion and neck supple.  Skin:    General: Skin is warm.     Comments: L-shaped semiapproximated flap to distal left thumb, still bleeding slowly  Neurological:     Mental Status: She is alert and oriented to person, place, and time.     Comments: Left hand neurovascularly intact  Psychiatric:        Mood and Affect: Mood normal.        Thought Content: Thought content normal.        Judgment: Judgment normal.      UC Treatments / Results  Labs (all labs ordered are listed, but only abnormal results are displayed) Labs Reviewed - No data to display  EKG   Radiology No results found.  Procedures Procedures (including critical care time)  Medications Ordered in UC Medications - No data to display  Initial Impression / Assessment and Plan / UC Course  I have reviewed the triage vital signs and the nursing notes.  Pertinent labs & imaging results that were available during my care of the patient were reviewed by me and considered in my medical decision making (see chart for details).     Let gel was applied to help control bleeding with good relief.  Discussed Dermabond versus pressure dressings and patient agreeable to pressure dressings.  Will forego the Dermabond as wound is fairly well-approximated at rest and will be easier to maintain cleanliness keeping open.  Dressing applied after thorough cleaning.  Tetanus up-to-date.  Keflex sent due to depth and location of wound, Hibiclens, mupirocin.  Return for worsening  symptoms.  Final Clinical Impressions(s) / UC Diagnoses   Final diagnoses:  Laceration of left thumb without foreign body with damage to nail, initial encounter     Discharge Instructions      Clean the area once to twice daily with Hibiclens and apply mupirocin ointment.  Apply a nonstick gauze pad and wrap the area in Coban wrap to keep the dressing in place.  Take the course of antibiotics as a precaution to keep the area from getting infected.  Elevate at rest to help with swelling.  Follow-up if area is becoming red, swollen, draining thick drainage.  Your tetanus shot is up-to-date based on your records as of March 2023.   ED Prescriptions     Medication Sig Dispense Auth. Provider   cephALEXin (KEFLEX) 500 MG capsule Take 1 capsule (500 mg total) by mouth 2 (two) times daily. 14 capsule Volney American, [place]   chlorhexidine (HIBICLENS) 4 % external liquid Apply topically daily as needed. 120 mL Volney American, PA-C   mupirocin ointment (BACTROBAN) 2 % Apply 1 Application topically 2 (two) times daily. 22 g Volney American, [place]      PDMP not reviewed this encounter.   Volney American, [place] 07/26/22 1836

## 2022-07-29 DIAGNOSIS — S93601A Unspecified sprain of right foot, initial encounter: Secondary | ICD-10-CM | POA: Diagnosis not present

## 2022-07-29 DIAGNOSIS — S92001A Unspecified fracture of right calcaneus, initial encounter for closed fracture: Secondary | ICD-10-CM | POA: Diagnosis not present

## 2022-07-31 ENCOUNTER — Telehealth: Payer: Self-pay | Admitting: Psychiatry

## 2022-07-31 NOTE — Telephone Encounter (Signed)
LVM and sent mychart msg informing pt of need to reschedule 08/19/22 appointment - MD out

## 2022-08-02 DIAGNOSIS — M25571 Pain in right ankle and joints of right foot: Secondary | ICD-10-CM | POA: Diagnosis not present

## 2022-08-02 DIAGNOSIS — M79671 Pain in right foot: Secondary | ICD-10-CM | POA: Diagnosis not present

## 2022-08-19 ENCOUNTER — Ambulatory Visit: Payer: 59 | Admitting: Psychiatry

## 2022-08-26 ENCOUNTER — Ambulatory Visit: Payer: 59 | Admitting: Psychiatry

## 2022-08-26 ENCOUNTER — Other Ambulatory Visit (HOSPITAL_COMMUNITY): Payer: Self-pay

## 2022-08-26 DIAGNOSIS — S93601A Unspecified sprain of right foot, initial encounter: Secondary | ICD-10-CM | POA: Diagnosis not present

## 2022-08-26 DIAGNOSIS — S9031XD Contusion of right foot, subsequent encounter: Secondary | ICD-10-CM | POA: Diagnosis not present

## 2022-08-26 DIAGNOSIS — S92214D Nondisplaced fracture of cuboid bone of right foot, subsequent encounter for fracture with routine healing: Secondary | ICD-10-CM | POA: Diagnosis not present

## 2022-08-26 MED ORDER — MELOXICAM 7.5 MG PO TABS
7.5000 mg | ORAL_TABLET | Freq: Every day | ORAL | 0 refills | Status: DC | PRN
  Filled 2022-08-26: qty 30, 30d supply, fill #0

## 2022-08-26 MED ORDER — DICLOFENAC SODIUM 1 % EX GEL
2.0000 g | Freq: Four times a day (QID) | CUTANEOUS | 2 refills | Status: DC
  Filled 2022-08-26: qty 100, 13d supply, fill #0

## 2022-08-27 ENCOUNTER — Ambulatory Visit: Payer: Self-pay | Admitting: Adult Health

## 2022-08-28 ENCOUNTER — Ambulatory Visit: Payer: Medicare HMO | Admitting: Student

## 2022-09-03 ENCOUNTER — Other Ambulatory Visit (HOSPITAL_COMMUNITY): Payer: Self-pay

## 2022-09-03 ENCOUNTER — Other Ambulatory Visit: Payer: Self-pay

## 2022-09-09 ENCOUNTER — Other Ambulatory Visit (HOSPITAL_COMMUNITY): Payer: Self-pay

## 2022-09-13 DIAGNOSIS — I10 Essential (primary) hypertension: Secondary | ICD-10-CM | POA: Diagnosis not present

## 2022-09-17 ENCOUNTER — Other Ambulatory Visit: Payer: Self-pay

## 2022-09-17 DIAGNOSIS — I340 Nonrheumatic mitral (valve) insufficiency: Secondary | ICD-10-CM | POA: Diagnosis not present

## 2022-09-17 DIAGNOSIS — I10 Essential (primary) hypertension: Secondary | ICD-10-CM | POA: Diagnosis not present

## 2022-09-17 DIAGNOSIS — Z8673 Personal history of transient ischemic attack (TIA), and cerebral infarction without residual deficits: Secondary | ICD-10-CM | POA: Diagnosis not present

## 2022-09-17 DIAGNOSIS — I2510 Atherosclerotic heart disease of native coronary artery without angina pectoris: Secondary | ICD-10-CM | POA: Diagnosis not present

## 2022-09-17 DIAGNOSIS — M48061 Spinal stenosis, lumbar region without neurogenic claudication: Secondary | ICD-10-CM | POA: Diagnosis not present

## 2022-09-17 DIAGNOSIS — Z23 Encounter for immunization: Secondary | ICD-10-CM | POA: Diagnosis not present

## 2022-09-17 DIAGNOSIS — Z0000 Encounter for general adult medical examination without abnormal findings: Secondary | ICD-10-CM | POA: Diagnosis not present

## 2022-09-17 DIAGNOSIS — J387 Other diseases of larynx: Secondary | ICD-10-CM | POA: Diagnosis not present

## 2022-09-25 ENCOUNTER — Ambulatory Visit: Payer: Medicare HMO | Attending: Student | Admitting: Nurse Practitioner

## 2022-09-25 ENCOUNTER — Encounter: Payer: Self-pay | Admitting: Nurse Practitioner

## 2022-09-25 VITALS — BP 128/80 | HR 79 | Ht 62.0 in | Wt 168.4 lb

## 2022-09-25 DIAGNOSIS — I10 Essential (primary) hypertension: Secondary | ICD-10-CM

## 2022-09-25 DIAGNOSIS — E785 Hyperlipidemia, unspecified: Secondary | ICD-10-CM

## 2022-09-25 DIAGNOSIS — K219 Gastro-esophageal reflux disease without esophagitis: Secondary | ICD-10-CM

## 2022-09-25 DIAGNOSIS — R002 Palpitations: Secondary | ICD-10-CM | POA: Diagnosis not present

## 2022-09-25 DIAGNOSIS — I340 Nonrheumatic mitral (valve) insufficiency: Secondary | ICD-10-CM | POA: Diagnosis not present

## 2022-09-25 NOTE — Patient Instructions (Signed)
Medication Instructions:   Continue all current medications.   Labwork:  none  Testing/Procedures:  Your physician has requested that you have an echocardiogram. Echocardiography is a painless test that uses sound waves to create images of your heart. It provides your doctor with information about the size and shape of your heart and how well your heart's chambers and valves are working. This procedure takes approximately one hour. There are no restrictions for this procedure. Please do NOT wear cologne, perfume, aftershave, or lotions (deodorant is allowed). Please arrive 15 minutes prior to your appointment time. Office will contact with results via phone, letter or mychart.     Follow-Up:  Your physician wants you to follow up in:  1 year.  You should receive a recall letter in the mail about 2 months prior to the time you are due.  If you don't receive this, please call our office to schedule your follow up appointment.      Any Other Special Instructions Will Be Listed Below (If Applicable).   If you need a refill on your cardiac medications before your next appointment, please call your pharmacy.  

## 2022-09-25 NOTE — Progress Notes (Signed)
Office Visit    Patient Name: Victoria Cooper Date of Encounter: 09/25/2022  PCP:  Merri Brunette, MD   [facility]  Cardiologist:  Nona Dell, MD  Advanced Practice Provider:  No care team member to display Electrophysiologist:  None   Chief Complaint    Victoria Cooper is a very pleasant 67 y.o. female with a hx of palpitations, hypertension, moderate mitral regurgitation, hyperlipidemia, asthma, and GERD, who presents today for 1 year follow-up.  Past Medical History    Past Medical History:  Diagnosis Date   Abnormal Pap smear of cervix 2006   Repeat Pap normal   Allergy    Animal bite 03/18/2011   Dog Bite   Arthritis    Asthma    Classical migraine with intractable migraine 11/30/2015   Connective tissue disease, undifferentiated (HCC) 2015   Dr. Dierdre Forth   Female hypogonadism syndrome 01/13/2019   GERD (gastroesophageal reflux disease)    Hyperlipidemia    Hypertension    Migraine    Vitamin D deficiency disease 01/13/2019   Wears glasses    Past Surgical History:  Procedure Laterality Date   ABDOMINAL HYSTERECTOMY  2003   hyst   BACK SURGERY  2009   lumb lam-fusion   Basil cell     BREAST REDUCTION SURGERY  05/26/2012   Procedure: MAMMARY REDUCTION  (BREAST);  Surgeon: Pleas Daveigh, MD;  Location: [facility];  Service: Plastics;  Laterality: Bilateral;   COLONOSCOPY  2003, 2018   diverticulosis    Allergies  Allergies  Allergen Reactions   Morphine And Codeine Itching   Sulfa Antibiotics Itching and Other (See Comments)    Itching in palms of hand and feet   Topamax [Topiramate]     Unknown to patient    History of Present Illness    Victoria Cooper is a very pleasant 67 y.o. female with a PMH as mentioned above.  Echocardiogram 01/2019 revealed EF 55 to 60%, moderate mitral regurgitation.  Last seen by Dr. Diona Browner on July 03, 2021.  She was doing well at the time.  Very active and was  enjoying retirement.  Today she presents for 1 year follow-up.  She states she is doing well.  Did have an episode of palpitations last night after eating [place] food, attributes this to GERD.  She has recently broken some bones in her right foot, has not been as active as she used to be.  Has put on some weight due to this, looking to lose weight.  She has recently joined [employer] and looking forward to this. Denies any chest pain, shortness of breath, palpitations, syncope, presyncope, dizziness, orthopnea, PND, swelling or significant weight changes, acute bleeding, or claudication.  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  Enjoys gardening.  EKGs/Labs/Other Studies Reviewed:   The following studies were reviewed today:   EKG:  EKG is not ordered today.     Echo 01/2019: 1. Left ventricular ejection fraction, by visual estimation, is 55 to  60%. The left ventricle has normal function. There is no left ventricular  hypertrophy.   2. Left ventricular diastolic parameters are consistent with Grade I  diastolic dysfunction (impaired relaxation).   3. Global right ventricle has normal systolic function.The right  ventricular size is normal. No increase in right ventricular wall  thickness.   4. Left atrial size was normal.   5. Right atrial size was normal.   6. The  mitral valve is grossly normal. Moderate mitral valve  regurgitation.   7. The tricuspid valve is grossly normal. Tricuspid valve regurgitation  is trivial.   8. The aortic valve is tricuspid. Aortic valve regurgitation is not  visualized. No evidence of aortic valve sclerosis or stenosis.   9. The pulmonic valve was grossly normal. Pulmonic valve regurgitation is  not visualized.  10. Normal pulmonary artery systolic pressure.  11. The inferior vena cava is normal in size with greater than 50%  respiratory variability, suggesting right atrial pressure of 3 mmHg.    Recent  Labs: 01/23/2022: ALT 30; BUN 13; Creatinine, Ser 0.60; Hemoglobin 14.3; Platelets 189; Potassium 3.6; Sodium 138  Recent Lipid Panel    Component Value Date/Time   CHOL 204 (H) 05/09/2020 1747   TRIG 112 05/09/2020 1747   HDL 56 05/09/2020 1747   CHOLHDL 3.6 05/09/2020 1747   LDLCALC 126 (H) 05/09/2020 1747    Home Medications   Current Meds  Medication Sig   Cholecalciferol (VITAMIN D-3) 125 MCG (5000 UT) TABS Take 10,000 Units by mouth daily.   estradiol (ESTRACE) 0.5 MG tablet Take 1 tablet (0.5 mg total) by mouth daily.   hydroxychloroquine (PLAQUENIL) 200 MG tablet Take 200 mg by mouth daily.   meloxicam (MOBIC) 7.5 MG tablet Take 1 tablet (7.5 mg total) by mouth daily as needed.   progesterone (PROMETRIUM) 200 MG capsule Take 1 capsule (200 mg total) by mouth every evening.   Rimegepant Sulfate (NURTEC) 75 MG TBDP Dissolve 75 mg by mouth as needed (for migraine). Max dose 1 tablet in 24 hours   rosuvastatin (CRESTOR) 20 MG tablet Take 1 tablet by mouth daily   traZODone (DESYREL) 150 MG tablet Take 1 tablet (150 mg total) by mouth at bedtime.   valsartan-hydrochlorothiazide (DIOVAN-HCT) 160-12.5 MG tablet TAKE 1 TABLET BY MOUTH ONCE DAILY     Review of Systems    All other systems reviewed and are otherwise negative except as noted above.  Physical Exam    VS:  BP 128/80   Pulse 79   Ht 5\' 2"  (1.575 m)   Wt 168 lb 6.4 oz (76.4 kg)   LMP 04/29/2001 (LMP Unknown)   SpO2 95%   BMI 30.80 kg/m  , BMI Body mass index is 30.8 kg/m.  Wt Readings from Last 3 Encounters:  09/25/22 168 lb 6.4 oz (76.4 kg)  01/30/22 163 lb 6.4 oz (74.1 kg)  01/23/22 160 lb (72.6 kg)     GEN: Well nourished, well developed, in no acute distress. HEENT: normal. Neck: Supple, no JVD, carotid bruits, or masses. Cardiac: S1/S2, RRR, no murmurs, rubs, or gallops. No clubbing, cyanosis, edema.  Radials/PT 2+ and equal bilaterally.  Respiratory:  Respirations regular and unlabored, clear to  auscultation bilaterally. GI: Soft, nontender, nondistended. MS: No deformity or atrophy. Skin: Warm and dry, no rash. Neuro:  Strength and sensation are intact. Psych: Normal affect.  Assessment & Plan    Palpitations Recent episode last night related to #5.  Denies any significant, sustained palpitations.  Recent labs unremarkable.  Will continue to monitor at this time, and if she has sustained symptoms, consider ZIO monitoring.  Heart healthy diet encouraged.  Hypertension Blood pressure stable.  Continue valsartan-hydrochlorothiazide. Discussed to monitor BP at home at least 2 hours after medications and sitting for 5-10 minutes.  Heart healthy diet encouraged.  Hyperlipidemia Recent labs with PCP unremarkable.  Continue rosuvastatin.  PCP to manage.  Moderate mitral valve regurgitation Echocardiogram in  2020 revealed moderate mitral valve regurgitation.  She is asymptomatic with this.  Will update echocardiogram at this time as this is due to be repeated.  GERD Recent symptoms related to current diet.  Heart healthy diet encouraged.  Discussed conservative measures, educational information given during office visit. Continue to follow with PCP.   Disposition: Follow up in 1 year(s) with Nona Dell, MD or APP.  Signed, Sharlene Dory, NP 09/25/2022, 10:09 AM  [facility]

## 2022-09-26 ENCOUNTER — Other Ambulatory Visit (HOSPITAL_COMMUNITY): Payer: Self-pay

## 2022-09-26 ENCOUNTER — Telehealth: Payer: Self-pay

## 2022-09-26 NOTE — Telephone Encounter (Signed)
Pharmacy Patient Advocate Encounter   Received notification from [facility] that prior authorization for Nurtec 75MG  dispersible tablets is required/requested.   PA submitted to [facility] via CoverMyMeds Key or (Medicaid) confirmation # [id number] Status is pending

## 2022-10-03 ENCOUNTER — Other Ambulatory Visit (HOSPITAL_COMMUNITY): Payer: Self-pay

## 2022-10-03 NOTE — Telephone Encounter (Signed)
Submitted request previously to      Pharmacy Patient Advocate Encounter   Received notification from [facility] that prior authorization for Nurtec 75MG  dispersible tablets is required/requested.   PA submitted to [facility] via CoverMyMeds Key or (Medicaid) confirmation # [id number] Status is pending  Please disregard the previous PA submission-PT no longer has [employer].

## 2022-10-03 NOTE — Telephone Encounter (Signed)
Pharmacy Patient Advocate Encounter  Prior Authorization for Nurtec 75MG  dispersible tablets has been APPROVED by [facility] from 04/29/2022 to 04/29/2023.   PA # PA Case ID: [id number]

## 2022-10-28 DIAGNOSIS — S92001A Unspecified fracture of right calcaneus, initial encounter for closed fracture: Secondary | ICD-10-CM | POA: Diagnosis not present

## 2022-10-28 DIAGNOSIS — S93601A Unspecified sprain of right foot, initial encounter: Secondary | ICD-10-CM | POA: Diagnosis not present

## 2022-10-29 ENCOUNTER — Other Ambulatory Visit (HOSPITAL_COMMUNITY): Payer: Self-pay

## 2022-10-29 MED ORDER — MELOXICAM 7.5 MG PO TABS
7.5000 mg | ORAL_TABLET | Freq: Every day | ORAL | 0 refills | Status: DC
  Filled 2022-10-29: qty 30, 30d supply, fill #0

## 2022-11-01 ENCOUNTER — Other Ambulatory Visit (HOSPITAL_COMMUNITY): Payer: Self-pay

## 2022-11-01 DIAGNOSIS — M47816 Spondylosis without myelopathy or radiculopathy, lumbar region: Secondary | ICD-10-CM | POA: Diagnosis not present

## 2022-11-01 DIAGNOSIS — M4726 Other spondylosis with radiculopathy, lumbar region: Secondary | ICD-10-CM | POA: Diagnosis not present

## 2022-11-01 DIAGNOSIS — M5416 Radiculopathy, lumbar region: Secondary | ICD-10-CM | POA: Diagnosis not present

## 2022-11-01 DIAGNOSIS — M48062 Spinal stenosis, lumbar region with neurogenic claudication: Secondary | ICD-10-CM | POA: Diagnosis not present

## 2022-11-01 DIAGNOSIS — M7918 Myalgia, other site: Secondary | ICD-10-CM | POA: Diagnosis not present

## 2022-11-01 DIAGNOSIS — R269 Unspecified abnormalities of gait and mobility: Secondary | ICD-10-CM | POA: Diagnosis not present

## 2022-11-01 DIAGNOSIS — M4316 Spondylolisthesis, lumbar region: Secondary | ICD-10-CM | POA: Diagnosis not present

## 2022-11-01 MED ORDER — PREGABALIN 75 MG PO CAPS
75.0000 mg | ORAL_CAPSULE | Freq: Two times a day (BID) | ORAL | 0 refills | Status: DC
  Filled 2022-11-01: qty 60, 30d supply, fill #0

## 2022-11-05 IMAGING — CT CT CARDIAC CORONARY ARTERY CALCIUM SCORE
3 series · 14 of 20 positions shown, 16 images · non-contrast
Comparison: None.

CLINICAL DATA: 64-year-old white female with hypertension.
Borderline cholesterol level.

EXAM:
CT CARDIAC CORONARY ARTERY CALCIUM SCORE
TECHNIQUE: Non-contrast imaging through the heart was performed using
prospective ECG gating. Image post processing was performed on an
independent workstation, allowing for quantitative analysis of the
heart and coronary arteries. Note that this exam targets the heart
and the chest was not imaged in its entirety.

[Series 2: calcium scoring 2.00 qr36 bestdiast 69% hrt calciu · axial · 0.37mm/px · z∈[+1780,+1846]mm · 4 of 55 slices shown]
[im 11/55  vessel]
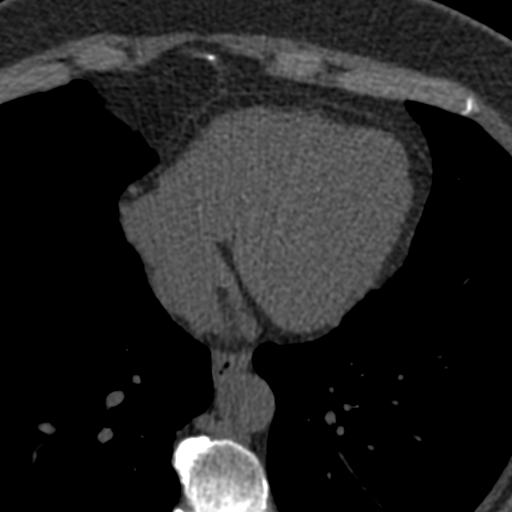
[im 22/55  vessel]
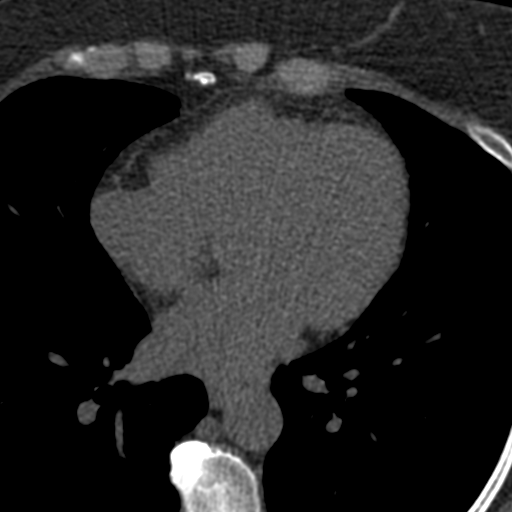
[im 33/55  vessel]
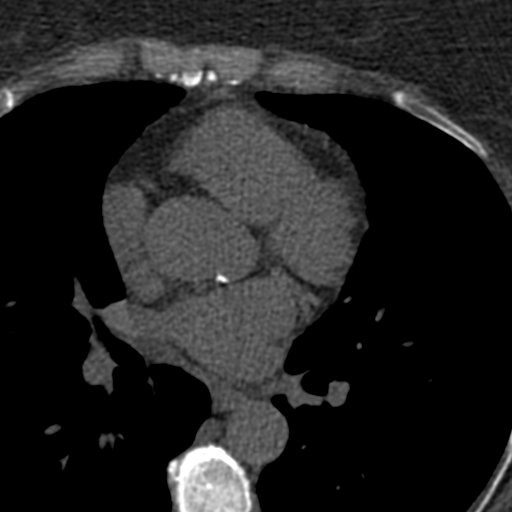
[im 44/55  vessel]
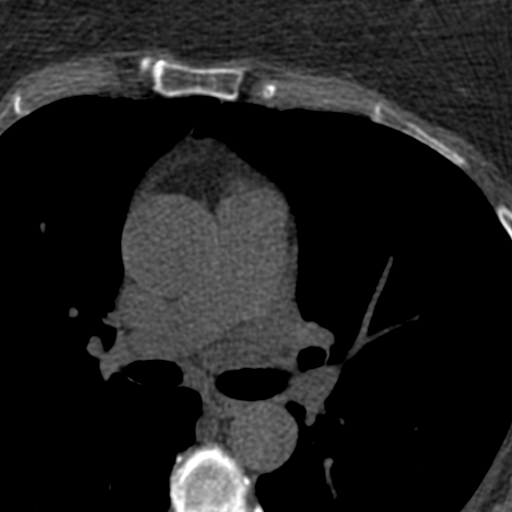

[Series 3: calcium scoring 2.00 br40 bestdiast 69% axial · axial · 0.63mm/px · z∈[+1778,+1850]mm · 5 of 55 slices shown, 7 images]
[im 10/55  vessel]
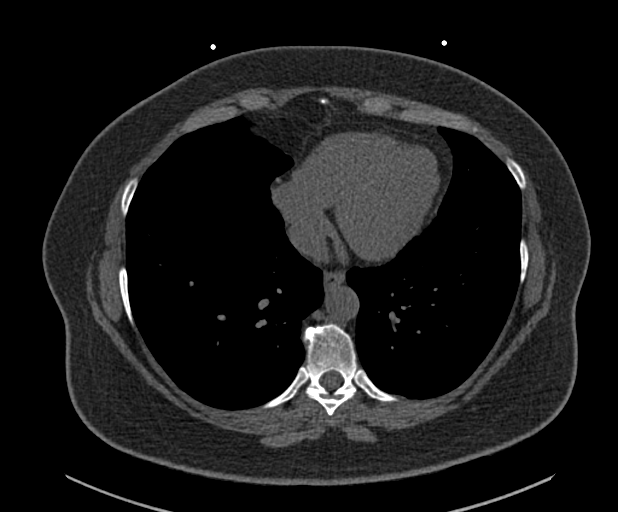
[im 10/55  lung]
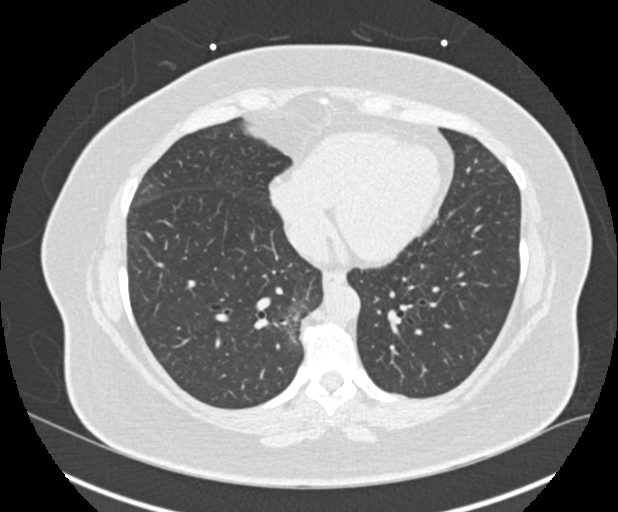
[im 19/55  vessel]
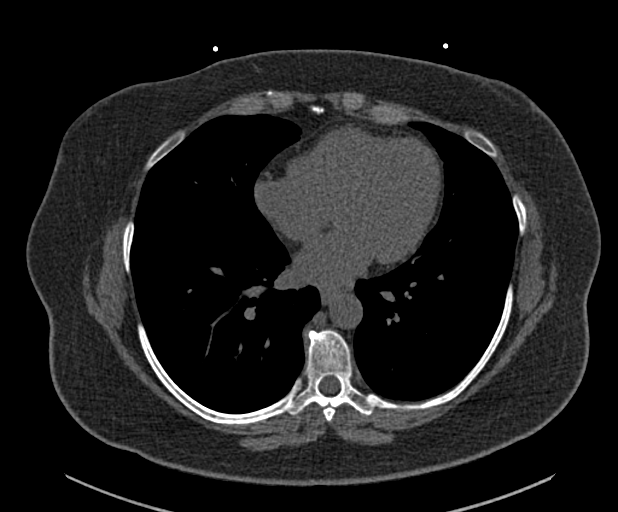
[im 28/55  vessel]
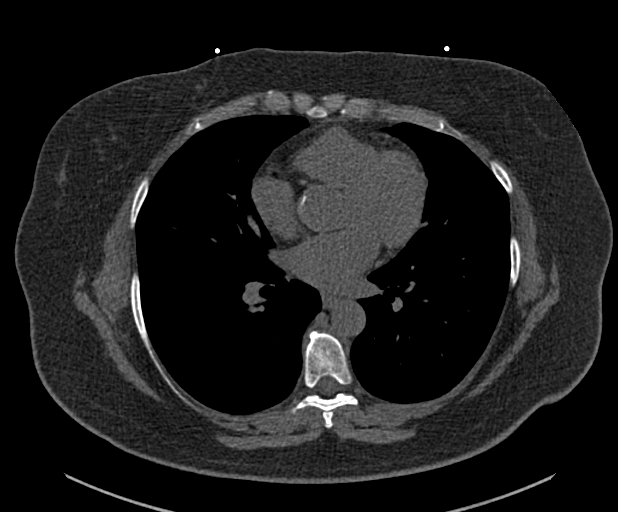
[im 37/55  vessel]
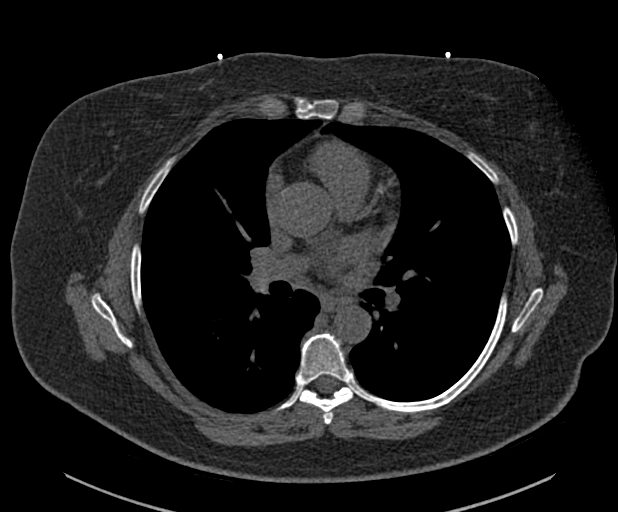
[im 46/55  vessel]
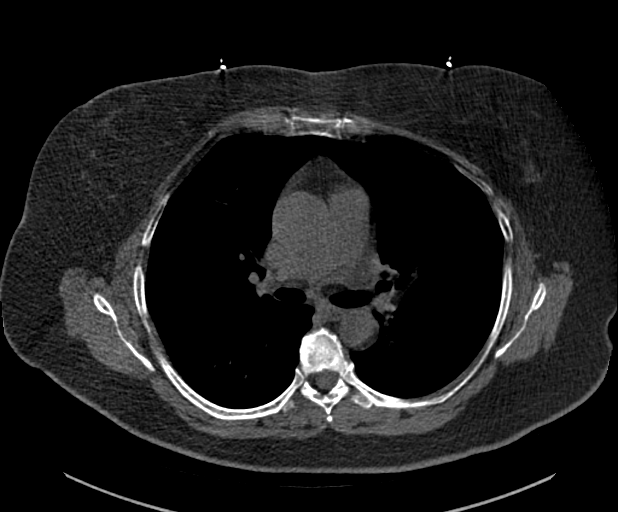
[im 46/55  lung]
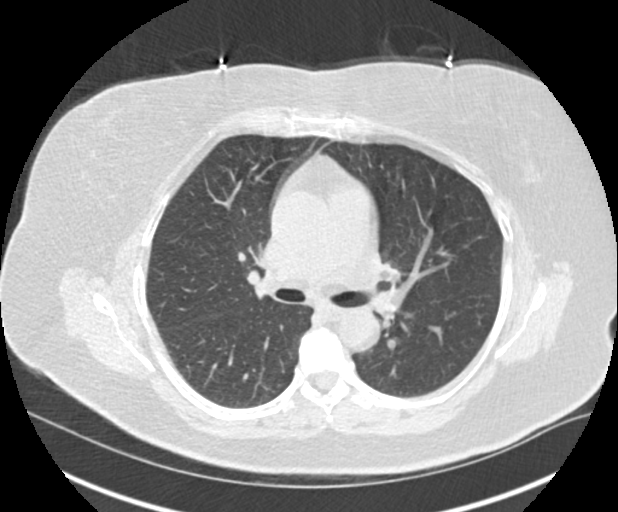

[Series 9: calcium scoring 2.00 br60 bestdiast 69% lungs · axial · 0.63mm/px · z∈[+1778,+1850]mm · 5 of 55 slices shown]
[im 10/55  vessel]
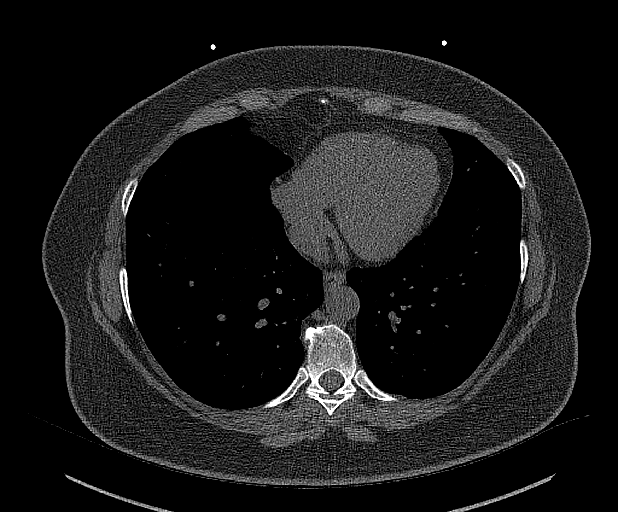
[im 19/55  vessel]
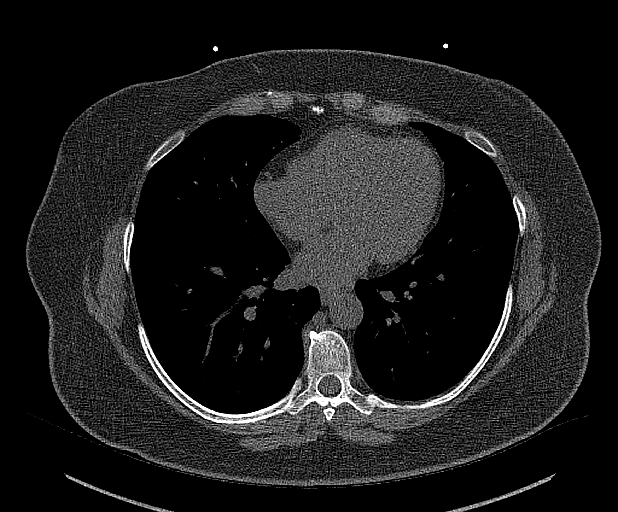
[im 28/55  vessel]
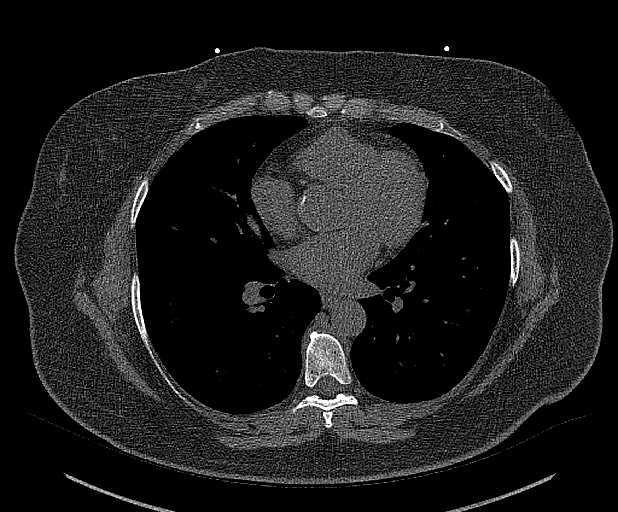
[im 37/55  vessel]
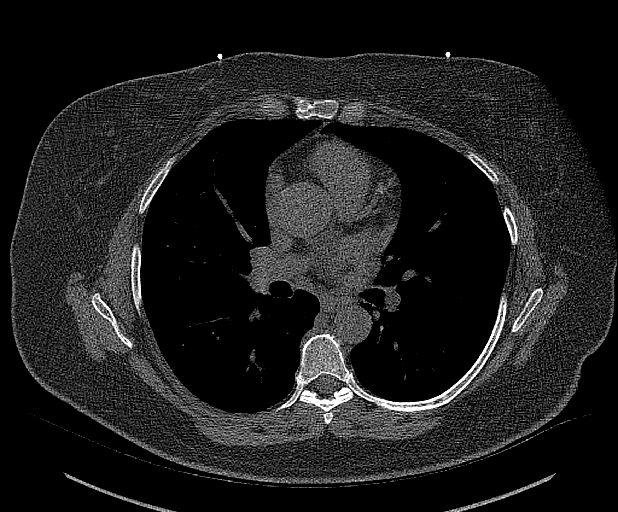
[im 46/55  vessel]
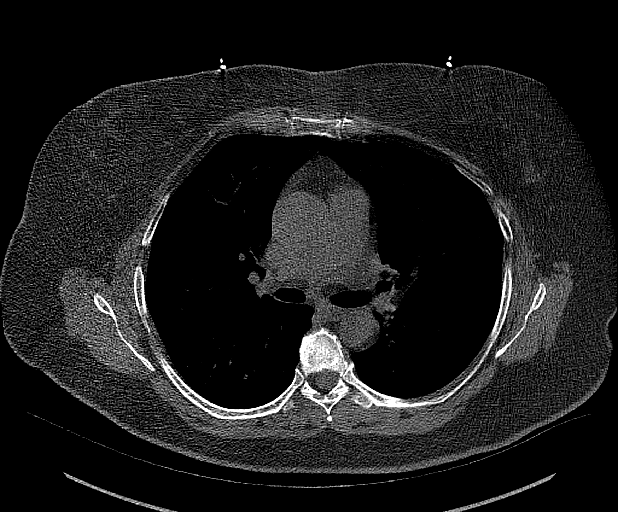

[14 of 20 positions shown; findings below may reference images not displayed]

FINDINGS: CORONARY CALCIUM SCORES:

Left Main: 0

LAD:

LCx:

RCA: 0

Total Agatston Score:

[HOSPITAL] percentile: 73

AORTA MEASUREMENTS:

Ascending Aorta: 37 mm

Descending Aorta: 26 mm

OTHER FINDINGS:

Atherosclerotic calcifications involving the aortic root. Normal
caliber of the visualized thoracic aorta. Heart size is normal. No
significant pericardial fluid. Mediastinal structures are
unremarkable. Visualized upper abdomen is unremarkable. 5 mm nodule
along the right minor fissure on sequence 9, image 20. Densities in
the medial right lower lung adjacent to prominent thoracic spine
osteophytes, these right lung densities are probably related to
scarring and/or atelectasis. Prominent osteophytes along the right
side of thoracic spine.
IMPRESSION: 1. Coronary calcium score is 31.7 and this is at percentile 73 for
patients of same age, gender and ethnicity.
2.  Aortic Atherosclerosis (TRE09-K66.6).
3. 5 mm nodule along the right minor fissure. This is probably an
incidental finding such as a fissural lymph node but indeterminate.
No follow-up needed if patient is low-risk. Non-contrast chest CT
can be considered in 12 months if patient is high-risk. This
recommendation follows the consensus statement: Guidelines for
Management of Incidental Pulmonary Nodules Detected on CT Images:

## 2022-11-11 ENCOUNTER — Ambulatory Visit (HOSPITAL_COMMUNITY)
Admission: RE | Admit: 2022-11-11 | Discharge: 2022-11-11 | Disposition: A | Discharge status: Home / Self Care | Payer: Medicare HMO | Source: Ambulatory Visit | Attending: Nurse Practitioner | Admitting: Nurse Practitioner

## 2022-11-11 DIAGNOSIS — I5030 Unspecified diastolic (congestive) heart failure: Secondary | ICD-10-CM

## 2022-11-11 DIAGNOSIS — I340 Nonrheumatic mitral (valve) insufficiency: Secondary | ICD-10-CM | POA: Diagnosis not present

## 2022-11-11 DIAGNOSIS — I083 Combined rheumatic disorders of mitral, aortic and tricuspid valves: Secondary | ICD-10-CM | POA: Diagnosis not present

## 2022-11-11 LAB — ECHOCARDIOGRAM COMPLETE
Area-P 1/2: 5.75 cm2
MV M vel: 5.4 m/s
MV Peak grad: 116.6 mmHg
MV VTI: 1.77 cm2
S' Lateral: 2.8 cm

## 2022-11-11 NOTE — Progress Notes (Signed)
  Echocardiogram 2D Echocardiogram has been performed.  Maren Reamer 11/11/2022, 1:47 PM

## 2022-11-13 NOTE — Progress Notes (Signed)
Chief Complaint  Patient presents with   Room 1    Pt is here Alone. Pt states that everything has been going okay since her last appointment. Pt states that she still has numbness in her left leg. Pt states that the Nurtec has been helping.     HISTORY OF PRESENT ILLNESS:  11/14/22 ALL:  Victoria Cooper is a 67 y.o. female here today for follow up for migraines. She was last seen by Dr Delena Bali 01/2022 following Dr Anne Hahn' retirement. She had an episode of right sided weakness and vertigo associated with migraine. MRI showed no acute proess but sis show old bilateral cerebral infarcts also present in 2017 imaging. CTA ordered to complete stroke workup. 50% narrowing in left ECA. No significant carotid stenosis. Asa 81mg  and Crestor 20mg  continued per PCP. Nurtec started for concerns of hemiplegic migraine. She was also reporting worsening low back pain s/p lumbar fusion. MRI showed severe spinal stenosis at L3-L4. She was referred to NS for repeat ESI and or consideration of surgery.   Since, she reports doing fairly well. She has had about 4 migraines over the past 6 months. Nurtec works very well. She continues to have left leg numbness. She was seen by Dr Maisie Fus, NS. He recommended surgery. Felt ESI would not be helpful. She was referred to PT. She completed 11 weeks of PT. She was not comfortable with considering surgery and Dr Renne Crigler referred her to [facility]. She was seen recently and planning to to have a type of injection next week.   HISTORY (copied from Dr Quentin Mulling previous note)  Medical co-morbidities: HLD, GERD   The patient presents for evaluation of headaches and weakness.  On 01/23/22 she was in a shop when she suddenly felt disoriented, then felt like her right leg was being pulled away from her. Right arm felt weak as well. This lasted for 3-4 minutes. She then developed a severe headache with photophobia, nausea, and vertigo. No phonophobia, numbness, or visual aura. She presented  to the ED where MRI brain showed no acute process but did show bilateral remote cerebellar infarcts (present on MRI in 2017 as well). She was unaware that she had a stroke in the past. Headache lasted 2-3 days and resolved on its own. 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    She has a history of migraine with aura since college. She is prescribed Maxalt but rarely takes it. Usually takes Excedrin which helps take the edge off. Only has one migraine every 6 months. Has lower level headaches ~2 days per week.   She previously followed with neurology in 2017 for migraine with visual and sensory aura. She was prescribed Topamax for migraine prevention, but this caused side effects so she stopped it.   She also reports pain in her lower back which radiates down her right leg. Sometimes feels like it will give out from under her. Has intermittent numbness in this leg as well. She has a history of lumbar stenosis s/p L4-S1 fusion. Feels symptoms have gotten worse since her surgery. She tried SI injections and physical therapy last year, but symptoms have persisted.   Headache History: Onset: college Triggers: weather changes Aura: visual aura, numbness Quality/Description: throbbing Associated Symptoms:             Photophobia: yes             Phonophobia: no  Nausea: yes Other symptoms: vertigo Worse with activity?: yes Duration of headaches: several hours   Migraine days per month: 1 Headache free days per month: 29   Current Treatment: Abortive Maxalt Excedrin   Preventative none   Prior Therapies                                 Topamax 75 mg QHS valsartan Maxalt 10 mg PRN   REVIEW OF SYSTEMS: Out of a complete 14 system review of symptoms, the patient complains only of the following symptoms, and all other reviewed systems are negative.   ALLERGIES: Allergies  Allergen Reactions   Morphine And Codeine  Itching   Sulfa Antibiotics Itching and Other (See Comments)    Itching in palms of hand and feet   Topamax [Topiramate]     Unknown to patient     HOME MEDICATIONS: Outpatient Medications Prior to Visit  Medication Sig Dispense Refill   Cholecalciferol (VITAMIN D-3) 125 MCG (5000 UT) TABS Take 10,000 Units by mouth daily.     estradiol (ESTRACE) 0.5 MG tablet Take 1 tablet (0.5 mg total) by mouth daily. 90 tablet 6   hydroxychloroquine (PLAQUENIL) 200 MG tablet Take 200 mg by mouth daily.     meloxicam (MOBIC) 7.5 MG tablet Take 1 tablet (7.5 mg total) by mouth daily as needed 30 tablet 0   pregabalin (LYRICA) 75 MG capsule Take 1 capsule (75 mg) by mouth 2 times daily. 60 capsule 0   progesterone (PROMETRIUM) 200 MG capsule Take 1 capsule (200 mg total) by mouth every evening. 90 capsule 1   rosuvastatin (CRESTOR) 20 MG tablet Take 1 tablet by mouth daily 90 tablet 3   traZODone (DESYREL) 150 MG tablet Take 1 tablet (150 mg total) by mouth at bedtime. 90 tablet 3   valsartan-hydrochlorothiazide (DIOVAN-HCT) 160-12.5 MG tablet TAKE 1 TABLET BY MOUTH ONCE DAILY 90 tablet 3   Rimegepant Sulfate (NURTEC) 75 MG TBDP Dissolve 75 mg by mouth as needed (for migraine). Max dose 1 tablet in 24 hours 8 tablet 6   meloxicam (MOBIC) 7.5 MG tablet Take 1 tablet (7.5 mg total) by mouth daily as needed. (Patient not taking: Reported on 11/14/2022) 30 tablet 0   No facility-administered medications prior to visit.     PAST MEDICAL HISTORY: Past Medical History:  Diagnosis Date   Abnormal Pap smear of cervix 2006   Repeat Pap normal   Allergy    Animal bite 03/18/2011   Dog Bite   Arthritis    Asthma    Classical migraine with intractable migraine 11/30/2015   Connective tissue disease, undifferentiated (HCC) 2015   Dr. Dierdre Forth   Female hypogonadism syndrome 01/13/2019   GERD (gastroesophageal reflux disease)    Hyperlipidemia    Hypertension    Migraine    Vitamin D deficiency disease  01/13/2019   Wears glasses      PAST SURGICAL HISTORY: Past Surgical History:  Procedure Laterality Date   ABDOMINAL HYSTERECTOMY  2003   hyst   BACK SURGERY  2009   lumb lam-fusion   Basil cell     BREAST REDUCTION SURGERY  05/26/2012   Procedure: MAMMARY REDUCTION  (BREAST);  Surgeon: Pleas Chaylee, MD;  Location: [facility];  Service: Plastics;  Laterality: Bilateral;   COLONOSCOPY  2003, 2018   diverticulosis     FAMILY HISTORY: Family History  Problem Relation Age of Onset  Heart disease Father    Lung cancer Mother        never smoker   ALS Maternal Grandfather    Melanoma Paternal Grandmother    Brain cancer Paternal Grandmother    Stroke Paternal Grandfather    Heart failure Maternal Grandmother    Ulcerative colitis Brother    Migraines Neg Hx    Colon cancer Neg Hx      SOCIAL HISTORY: Social History   Socioeconomic History   Marital status: Married    Spouse name: Brett Canales   Number of children: 3   Years of education: Master's   Highest education level: Not on file  Occupational History   Occupation: Chaplain    Comment: Estate manager/land agent    Comment: retired  Tobacco Use   Smoking status: Never   Smokeless tobacco: Never  Vaping Use   Vaping status: Never Used  Substance and Sexual Activity   Alcohol use: Yes    Alcohol/week: 0.0 - 2.0 standard drinks of alcohol   Drug use: No   Sexual activity: Yes    Partners: Male    Birth control/protection: Surgical    Comment: hysterectomy  Other Topics Concern   Not on file  Social History Narrative   Lives at home w/ her husband   Retired Orthoptist.   Right-handed   Caffeine: 1-3 cups of coffee per day   Social Determinants of Health   Financial Resource Strain: Low Risk  (10/30/2022)   Received from [facility]   Overall Financial Resource Strain (CARDIA)    Difficulty of Paying Living Expenses: Not hard at all  Food Insecurity: No Food Insecurity (10/30/2022)    Received from [facility]   Hunger Vital Sign    Worried About Running Out of Food in the Last Year: Never true    Ran Out of Food in the Last Year: Never true  Transportation Needs: No Transportation Needs (10/30/2022)   Received from [facility] - Transportation    In the past 12 months, has lack of transportation kept you from medical appointments or from getting medications?: No    Lack of Transportation (Non-Medical): No  Physical Activity: Not on file  Stress: Not on file  Social Connections: Not on file  Intimate Partner Violence: Not on file     PHYSICAL EXAM  Vitals:   11/14/22 1302  BP: 115/70  Pulse: 86  Weight: 167 lb (75.8 kg)  Height: 5\' 2"  (1.575 m)   Body mass index is 30.54 kg/m.  Generalized: Well developed, in no acute distress  Cardiology: normal rate and rhythm, no murmur auscultated  Respiratory: clear to auscultation bilaterally    Neurological examination  Mentation: Alert oriented to time, place, history taking. Follows all commands speech and language fluent Cranial nerve II-XII: Pupils were equal round reactive to light. Extraocular movements were full, visual field were full on confrontational test. Facial sensation and strength were normal. Head turning and shoulder shrug  were normal and symmetric. Motor: The motor testing reveals 5 over 5 strength of all 4 extremities. Good symmetric motor tone is noted throughout.  Gait and station: Gait is normal.     DIAGNOSTIC DATA (LABS, IMAGING, TESTING) - I reviewed patient records, labs, notes, testing and imaging myself where available.  Lab Results  Component Value Date   WBC 7.1 01/23/2022   HGB 14.3 01/23/2022   HCT 42.0 01/23/2022   MCV 92.9 01/23/2022   PLT 189  01/23/2022      Component Value Date/Time   NA 138 01/23/2022 1552   K 3.6 01/23/2022 1552   CL 101 01/23/2022 1552   CO2 24 01/23/2022 1546   GLUCOSE 94 01/23/2022 1552   BUN 13  01/23/2022 1552   CREATININE 0.60 01/23/2022 1552   CREATININE 0.69 05/09/2020 1747   CALCIUM 9.4 01/23/2022 1546   PROT 7.3 01/23/2022 1546   ALBUMIN 4.2 01/23/2022 1546   AST 26 01/23/2022 1546   ALT 30 01/23/2022 1546   ALKPHOS 39 01/23/2022 1546   BILITOT 0.7 01/23/2022 1546   GFRNONAA >60 01/23/2022 1546   GFRNONAA 93 05/09/2020 1747   GFRAA 107 05/09/2020 1747   Lab Results  Component Value Date   CHOL 204 (H) 05/09/2020   HDL 56 05/09/2020   LDLCALC 126 (H) 05/09/2020   TRIG 112 05/09/2020   CHOLHDL 3.6 05/09/2020   No results found for: "HGBA1C" No results found for: "VITAMINB12" Lab Results  Component Value Date   TSH 1.72 05/09/2020        No data to display               No data to display           ASSESSMENT AND PLAN  67 y.o. year old female  has a past medical history of Abnormal Pap smear of cervix (2006), Allergy, Animal bite (03/18/2011), Arthritis, Asthma, Classical migraine with intractable migraine (11/30/2015), Connective tissue disease, undifferentiated (HCC) (2015), Female hypogonadism syndrome (01/13/2019), GERD (gastroesophageal reflux disease), Hyperlipidemia, Hypertension, Migraine, Vitamin D deficiency disease (01/13/2019), and Wears glasses. here with    Cerebrovascular accident (CVA), unspecified mechanism (HCC)  Migraine with aura and without status migrainosus, not intractable  Lumbar radiculopathy  JULLIE DESHOTELS is doing well. Migraines are well managed with Nurtec as needed. Will continue. She is seeing spine specialist at [facility] and will continue follow up as directed. She will continue asa and rosuvastatin for stroke prevention. Healthy lifestyle habits encouraged. She will follow up with PCP as directed. She will return to see me in 1 year, sooner if needed. She verbalizes understanding and agreement with this plan.   No orders of the defined types were placed in this encounter.    Meds ordered this encounter   Medications   Rimegepant Sulfate (NURTEC) 75 MG TBDP    Sig: Dissolve 75 mg by mouth as needed (for migraine). Max dose 1 tablet in 24 hours    Dispense:  8 tablet    Refill:  11    Order Specific Question:   Supervising Provider    Answer:   Anson Fret [[id number]]     Shawnie Dapper, MSN, FNP-C 11/14/2022, 1:42 PM  [facility] [address] [place], [place] [address] [phone]

## 2022-11-13 NOTE — Patient Instructions (Signed)
Below is our plan:  We will continue   Please make sure you are staying well hydrated. I recommend 50-60 ounces daily. Well balanced diet and regular exercise encouraged. Consistent sleep schedule with 6-8 hours recommended.   Please continue follow up with care team as directed.   Follow up with me in 1 year   You may receive a survey regarding today's visit. I encourage you to leave honest feed back as I do use this information to improve patient care. Thank you for seeing me today!   GENERAL HEADACHE INFORMATION:   Natural supplements: Magnesium Oxide or Magnesium Glycinate 500 mg at bed (up to 800 mg daily) Coenzyme Q10 300 mg in AM Vitamin B2- 200 mg twice a day   Add 1 supplement at a time since even natural supplements can have undesirable side effects. You can sometimes buy supplements cheaper (especially Coenzyme Q10) at www.WebmailGuide.co.za or at [facility].  Migraine with aura: There is increased risk for stroke in women with migraine with aura and a contraindication for the combined contraceptive pill for use by women who have migraine with aura. The risk for women with migraine without aura is lower. However other risk factors like smoking are far more likely to increase stroke risk than migraine. There is a recommendation for no smoking and for the use of OCPs without estrogen such as progestogen only pills particularly for women with migraine with aura.Marland Kitchen People who have migraine headaches with auras may be 3 times more likely to have a stroke caused by a blood clot, compared to migraine patients who don't see auras. Women who take hormone-replacement therapy may be 30 percent more likely to suffer a clot-based stroke than women not taking medication containing estrogen. Other risk factors like smoking and high blood pressure may be  much more important.    Vitamins and herbs that show potential:   Magnesium: Magnesium (250 mg twice a day or 500 mg at bed) has a relaxant effect on  smooth muscles such as blood vessels. Individuals suffering from frequent or daily headache usually have low magnesium levels which can be increase with daily supplementation of 400-750 mg. Three trials found 40-90% average headache reduction  when used as a preventative. Magnesium may help with headaches are aura, the best evidence for magnesium is for migraine with aura is its thought to stop the cortical spreading depression we believe is the pathophysiology of migraine aura.Magnesium also demonstrated the benefit in menstrually related migraine.  Magnesium is part of the messenger system in the serotonin cascade and it is a good muscle relaxant.  It is also useful for constipation which can be a side effect of other medications used to treat migraine. Good sources include nuts, whole grains, and tomatoes. Side Effects: loose stool/diarrhea  Riboflavin (vitamin B 2) 200 mg twice a day. This vitamin assists nerve cells in the production of ATP a principal energy storing molecule.  It is necessary for many chemical reactions in the body.  There have been at least 3 clinical trials of riboflavin using 400 mg per day all of which suggested that migraine frequency can be decreased.  All 3 trials showed significant improvement in over half of migraine sufferers.  The supplement is found in bread, cereal, milk, meat, and poultry.  Most Americans get more riboflavin than the recommended daily allowance, however riboflavin deficiency is not necessary for the supplements to help prevent headache. Side effects: energizing, green urine   Coenzyme Q10: This is present in almost  all cells in the body and is critical component for the conversion of energy.  Recent studies have shown that a nutritional supplement of CoQ10 can reduce the frequency of migraine attacks by improving the energy production of cells as with riboflavin.  Doses of 150 mg twice a day have been shown to be effective.   Melatonin: Increasing evidence  shows correlation between melatonin secretion and headache conditions.  Melatonin supplementation has decreased headache intensity and duration.  It is widely used as a sleep aid.  Sleep is natures way of dealing with migraine.  A dose of 3 mg is recommended to start for headaches including cluster headache. Higher doses up to 15 mg has been reviewed for use in Cluster headache and have been used. The rationale behind using melatonin for cluster is that many theories regarding the cause of Cluster headache center around the disruption of the normal circadian rhythm in the brain.  This helps restore the normal circadian rhythm.   HEADACHE DIET: Foods and beverages which may trigger migraine Note that only 20% of headache patients are food sensitive. You will know if you are food sensitive if you get a headache consistently 20 minutes to 2 hours after eating a certain food. Only cut out a food if it causes headaches, otherwise you might remove foods you enjoy! What matters most for diet is to eat a well balanced healthy diet full of vegetables and low fat protein, and to not miss meals.   Chocolate, other sweets ALL cheeses except cottage and cream cheese Dairy products, yogurt, sour cream, ice cream Liver Meat extracts (Bovril, Marmite, meat tenderizers) Meats or fish which have undergone aging, fermenting, pickling or smoking. These include: Hotdogs,salami,Lox,sausage, mortadellas,smoked salmon, pepperoni, Pickled herring Pods of broad bean (English beans, Chinese pea pods, [place] (fava) beans, lima and navy beans Ripe avocado, ripe banana Yeast extracts or active yeast preparations such as Brewer's or Fleishman's (commercial bakes goods are permitted) Tomato based foods, pizza (lasagna, etc.)   MSG (monosodium glutamate) is disguised as many things; look for these common aliases: Monopotassium glutamate Autolysed yeast Hydrolysed protein Sodium caseinate "flavorings" "all natural  preservatives" Nutrasweet   Avoid all other foods that convincingly provoke headaches.   Resources: The Dizzy [place]air Laundry Your Headache Diet, migrainestrong.com  https://zamora-andrews.com/   Caffeine and Migraine For patients that have migraine, caffeine intake more than 3 days per week can lead to dependency and increased migraine frequency. I would recommend cutting back on your caffeine intake as best you can. The recommended amount of caffeine is 200-300 mg daily, although migraine patients may experience dependency at even lower doses. While you may notice an increase in headache temporarily, cutting back will be helpful for headaches in the long run. For more information on caffeine and migraine, visit: https://americanmigrainefoundation.org/resource-library/caffeine-and-migraine/   Headache Prevention Strategies:   1. Maintain a headache diary; learn to identify and avoid triggers.  - This can be a simple note where you log when you had a headache, associated symptoms, and medications used - There are several smartphone apps developed to help track migraines: Migraine Buddy, Migraine Monitor, Curelator N1-Headache App   Common triggers include: Emotional triggers: Emotional/Upset family or friends Emotional/Upset occupation Business reversal/success Anticipation anxiety Crisis-serious Post-crisis periodNew job/position   Physical triggers: Vacation Day Weekend Strenuous Exercise High Altitude Location New Move Menstrual Day Physical Illness Oversleep/Not enough sleep Weather changes Light: Photophobia or light sesnitivity treatment involves a balance between desensitization and reduction in overly strong input. Use dark polarized  glasses outside, but not inside. Avoid bright or fluorescent light, but do not dim environment to the point that going into a normally lit room hurts. Consider FL-41 tint lenses, which reduce the most  irritating wavelengths without blocking too much light.  These can be obtained at axonoptics.com or theraspecs.com Foods: see list above.   2. Limit use of acute treatments (over-the-counter medications, triptans, etc.) to no more than 2 days per week or 10 days per month to prevent medication overuse headache (rebound headache).     3. Follow a regular schedule (including weekends and holidays): Don't skip meals. Eat a balanced diet. 8 hours of sleep nightly. Minimize stress. Exercise 30 minutes per day. Being overweight is associated with a 5 times increased risk of chronic migraine. Keep well hydrated and drink 6-8 glasses of water per day.   4. Initiate non-pharmacologic measures at the earliest onset of your headache. Rest and quiet environment. Relax and reduce stress. Breathe2Relax is a free app that can instruct you on    some simple relaxtion and breathing techniques. Http://Dawnbuse.com is a    free website that provides teaching videos on relaxation.  Also, there are  many apps that   can be downloaded for "mindful" relaxation.  An app called YOGA NIDRA will help walk you through mindfulness. Another app called Calm can be downloaded to give you a structured mindfulness guide with daily reminders and skill development. Headspace for guided meditation Mindfulness Based Stress Reduction Online Course: www.palousemindfulness.com Cold compresses.   5. Don't wait!! Take the maximum allowable dosage of prescribed medication at the first sign of migraine.   6. Compliance:  Take prescribed medication regularly as directed and at the first sign of a migraine.   7. Communicate:  Call your physician when problems arise, especially if your headaches change, increase in frequency/severity, or become associated with neurological symptoms (weakness, numbness, slurred speech, etc.). Proceed to emergency room if you experience new or worsening symptoms or symptoms do not resolve, if you have new  neurologic symptoms or if headache is severe, or for any concerning symptom.   8. Headache/pain management therapies: Consider various complementary methods, including medication, behavioral therapy, psychological counselling, biofeedback, massage therapy, acupuncture, dry needling, and other modalities.  Such measures may reduce the need for medications. Counseling for pain management, where patients learn to function and ignore/minimize their pain, seems to work very well.   9. Recommend changing family's attention and focus away from patient's headaches. Instead, emphasize daily activities. If first question of day is 'How are your headaches/Do you have a headache today?', then patient will constantly think about headaches, thus making them worse. Goal is to re-direct attention away from headaches, toward daily activities and other distractions.   10. Helpful Websites: www.AmericanHeadacheSociety.org PatentHood.ch www.headaches.org TightMarket.nl www.achenet.org

## 2022-11-14 ENCOUNTER — Ambulatory Visit: Payer: Medicare HMO | Admitting: Family Medicine

## 2022-11-14 ENCOUNTER — Encounter: Payer: Self-pay | Admitting: Family Medicine

## 2022-11-14 ENCOUNTER — Other Ambulatory Visit: Payer: Self-pay

## 2022-11-14 VITALS — BP 115/70 | HR 86 | Ht 62.0 in | Wt 167.0 lb

## 2022-11-14 DIAGNOSIS — I639 Cerebral infarction, unspecified: Secondary | ICD-10-CM | POA: Diagnosis not present

## 2022-11-14 DIAGNOSIS — G43109 Migraine with aura, not intractable, without status migrainosus: Secondary | ICD-10-CM

## 2022-11-14 DIAGNOSIS — M5416 Radiculopathy, lumbar region: Secondary | ICD-10-CM | POA: Diagnosis not present

## 2022-11-14 MED ORDER — NURTEC 75 MG PO TBDP
75.0000 mg | ORAL_TABLET | ORAL | 11 refills | Status: DC | PRN
  Filled 2022-11-14: qty 8, 30d supply, fill #0

## 2022-11-18 DIAGNOSIS — M5416 Radiculopathy, lumbar region: Secondary | ICD-10-CM | POA: Diagnosis not present

## 2022-11-18 DIAGNOSIS — M48062 Spinal stenosis, lumbar region with neurogenic claudication: Secondary | ICD-10-CM | POA: Diagnosis not present

## 2022-11-26 ENCOUNTER — Encounter: Payer: Self-pay | Admitting: Cardiology

## 2022-11-28 ENCOUNTER — Ambulatory Visit: Payer: Medicare HMO | Admitting: Family Medicine

## 2022-12-01 ENCOUNTER — Other Ambulatory Visit (HOSPITAL_COMMUNITY): Payer: Self-pay

## 2022-12-02 ENCOUNTER — Other Ambulatory Visit (HOSPITAL_COMMUNITY): Payer: Self-pay

## 2022-12-02 ENCOUNTER — Other Ambulatory Visit: Payer: Self-pay

## 2022-12-02 MED ORDER — VALSARTAN-HYDROCHLOROTHIAZIDE 160-12.5 MG PO TABS
1.0000 | ORAL_TABLET | Freq: Every day | ORAL | 3 refills | Status: DC
  Filled 2022-12-02: qty 90, 90d supply, fill #0
  Filled 2023-03-03: qty 90, 90d supply, fill #1

## 2022-12-02 MED ORDER — PROGESTERONE 200 MG PO CAPS
200.0000 mg | ORAL_CAPSULE | Freq: Every evening | ORAL | 1 refills | Status: DC
  Filled 2022-12-02: qty 90, 90d supply, fill #0
  Filled 2023-03-03: qty 90, 90d supply, fill #1

## 2022-12-03 ENCOUNTER — Other Ambulatory Visit (HOSPITAL_COMMUNITY): Payer: Self-pay

## 2022-12-05 ENCOUNTER — Other Ambulatory Visit: Payer: Self-pay

## 2022-12-05 ENCOUNTER — Other Ambulatory Visit (HOSPITAL_COMMUNITY): Payer: Self-pay

## 2022-12-05 MED ORDER — PREGABALIN 75 MG PO CAPS
ORAL_CAPSULE | ORAL | 0 refills | Status: DC
  Filled 2022-12-05: qty 180, 90d supply, fill #0

## 2022-12-06 ENCOUNTER — Other Ambulatory Visit (HOSPITAL_COMMUNITY): Payer: Self-pay

## 2022-12-06 ENCOUNTER — Encounter (HOSPITAL_COMMUNITY): Payer: Self-pay

## 2022-12-08 ENCOUNTER — Other Ambulatory Visit (HOSPITAL_COMMUNITY): Payer: Self-pay

## 2022-12-09 ENCOUNTER — Other Ambulatory Visit (HOSPITAL_COMMUNITY): Payer: Self-pay

## 2022-12-09 ENCOUNTER — Other Ambulatory Visit: Payer: Self-pay

## 2022-12-09 MED ORDER — TRAZODONE HCL 150 MG PO TABS
150.0000 mg | ORAL_TABLET | Freq: Every day | ORAL | 3 refills | Status: DC
  Filled 2022-12-09: qty 90, 90d supply, fill #0
  Filled 2023-03-03: qty 90, 90d supply, fill #1

## 2022-12-10 NOTE — Progress Notes (Deleted)
67 y.o. G53P3003 Married Caucasian female here for annual exam.    PCP:     Patient's last menstrual period was 04/29/2001 (lmp unknown).           Sexually active: {yes no:314532}  The current method of family planning is status post hysterectomy.    Exercising: {yes no:314532}  {types:19826} Smoker:  no  Health Maintenance: Pap:  2012 neg History of abnormal Pap:  yes, repeat pap normal MMG:  10/18/20 BI-RADS CAT 1 neg Colonoscopy:  08/12/16 BMD:   01/07/22  Result  osteopenic  TDaP:  07/19/21 Gardasil:   no HIV: 07/18/16 NR Hep C: 07/18/16 Neg Screening Labs:  Hb today: ***, Urine today: ***   reports that she has never smoked. She has never used smokeless tobacco. She reports current alcohol use. She reports that she does not use drugs.  Past Medical History:  Diagnosis Date   Abnormal Pap smear of cervix 2006   Repeat Pap normal   Allergy    Animal bite 03/18/2011   Dog Bite   Arthritis    Asthma    Classical migraine with intractable migraine 11/30/2015   Connective tissue disease, undifferentiated (HCC) 2015   Dr. Dierdre Forth   Female hypogonadism syndrome 01/13/2019   GERD (gastroesophageal reflux disease)    Hyperlipidemia    Hypertension    Migraine    Vitamin D deficiency disease 01/13/2019   Wears glasses     Past Surgical History:  Procedure Laterality Date   ABDOMINAL HYSTERECTOMY  2003   hyst   BACK SURGERY  2009   lumb lam-fusion   Basil cell     BREAST REDUCTION SURGERY  05/26/2012   Procedure: MAMMARY REDUCTION  (BREAST);  Surgeon: Pleas Kimba, MD;  Location: [facility];  Service: Plastics;  Laterality: Bilateral;   COLONOSCOPY  2003, 2018   diverticulosis    Current Outpatient Medications  Medication Sig Dispense Refill   Cholecalciferol (VITAMIN D-3) 125 MCG (5000 UT) TABS Take 10,000 Units by mouth daily.     estradiol (ESTRACE) 0.5 MG tablet Take 1 tablet (0.5 mg total) by mouth daily. 90 tablet 6   hydroxychloroquine  (PLAQUENIL) 200 MG tablet Take 200 mg by mouth daily.     meloxicam (MOBIC) 7.5 MG tablet Take 1 tablet (7.5 mg total) by mouth daily as needed 30 tablet 0   pregabalin (LYRICA) 75 MG capsule Take 1 capsule (75 mg total) by mouth 2 (two) times daily 180 capsule 0   progesterone (PROMETRIUM) 200 MG capsule Take 1 capsule (200 mg total) by mouth every evening. 90 capsule 1   Rimegepant Sulfate (NURTEC) 75 MG TBDP Dissolve 75 mg by mouth as needed (for migraine). Max dose 1 tablet in 24 hours 8 tablet 11   rosuvastatin (CRESTOR) 20 MG tablet Take 1 tablet by mouth daily 90 tablet 3   traZODone (DESYREL) 150 MG tablet Take 1 tablet (150 mg total) by mouth at bedtime. 90 tablet 3   valsartan-hydrochlorothiazide (DIOVAN-HCT) 160-12.5 MG tablet Take 1 tablet by mouth daily. 90 tablet 3   No current facility-administered medications for this visit.    Family History  Problem Relation Age of Onset   Heart disease Father    Lung cancer Mother        never smoker   ALS Maternal Grandfather    Melanoma Paternal Grandmother    Brain cancer Paternal Grandmother    Stroke Paternal Grandfather    Heart failure Maternal Grandmother  Ulcerative colitis Brother    Migraines Neg Hx    Colon cancer Neg Hx     Review of Systems  Exam:   LMP 04/29/2001 (LMP Unknown)     General appearance: alert, cooperative and appears stated age Head: normocephalic, without obvious abnormality, atraumatic Neck: no adenopathy, supple, symmetrical, trachea midline and thyroid normal to inspection and palpation Lungs: clear to auscultation bilaterally Breasts: normal appearance, no masses or tenderness, No nipple retraction or dimpling, No nipple discharge or bleeding, No axillary adenopathy Heart: regular rate and rhythm Abdomen: soft, non-tender; no masses, no organomegaly Extremities: extremities normal, atraumatic, no cyanosis or edema Skin: skin color, texture, turgor normal. No rashes or lesions Lymph nodes:  cervical, supraclavicular, and axillary nodes normal. Neurologic: grossly normal  Pelvic: External genitalia:  no lesions              No abnormal inguinal nodes palpated.              Urethra:  normal appearing urethra with no masses, tenderness or lesions              Bartholins and Skenes: normal                 Vagina: normal appearing vagina with normal color and discharge, no lesions              Cervix: no lesions              Pap taken: {yes no:314532} Bimanual Exam:  Uterus:  normal size, contour, position, consistency, mobility, non-tender              Adnexa: no mass, fullness, tenderness              Rectal exam: {yes no:314532}.  Confirms.              Anus:  normal sphincter tone, no lesions  Chaperone was present for exam:  ***  Assessment:   Well woman visit with gynecologic exam.   Plan: Mammogram screening discussed. Self breast awareness reviewed. Pap and HR HPV as above. Guidelines for Calcium, Vitamin D, regular exercise program including cardiovascular and weight bearing exercise.   Follow up annually and prn.   Additional counseling given.  {yes [id number]. _______ minutes face to face time of which over 50% was spent in counseling.    After visit summary provided.

## 2022-12-24 ENCOUNTER — Ambulatory Visit: Payer: Medicare HMO | Admitting: Obstetrics and Gynecology

## 2023-01-01 DIAGNOSIS — R269 Unspecified abnormalities of gait and mobility: Secondary | ICD-10-CM | POA: Diagnosis not present

## 2023-01-01 DIAGNOSIS — M48062 Spinal stenosis, lumbar region with neurogenic claudication: Secondary | ICD-10-CM | POA: Diagnosis not present

## 2023-01-01 DIAGNOSIS — M7918 Myalgia, other site: Secondary | ICD-10-CM | POA: Diagnosis not present

## 2023-01-01 DIAGNOSIS — M47816 Spondylosis without myelopathy or radiculopathy, lumbar region: Secondary | ICD-10-CM | POA: Diagnosis not present

## 2023-01-01 DIAGNOSIS — M5416 Radiculopathy, lumbar region: Secondary | ICD-10-CM | POA: Diagnosis not present

## 2023-01-14 ENCOUNTER — Other Ambulatory Visit (HOSPITAL_COMMUNITY): Payer: Self-pay

## 2023-01-14 ENCOUNTER — Other Ambulatory Visit: Payer: Self-pay

## 2023-01-14 MED ORDER — DIAZEPAM 5 MG PO TABS
ORAL_TABLET | ORAL | 0 refills | Status: DC
  Filled 2023-01-14 – 2023-01-20 (×2): qty 2, 1d supply, fill #0

## 2023-01-15 ENCOUNTER — Other Ambulatory Visit: Payer: Self-pay

## 2023-01-20 ENCOUNTER — Encounter (HOSPITAL_COMMUNITY): Payer: Self-pay

## 2023-01-20 ENCOUNTER — Other Ambulatory Visit (HOSPITAL_COMMUNITY): Payer: Self-pay

## 2023-01-21 DIAGNOSIS — M47816 Spondylosis without myelopathy or radiculopathy, lumbar region: Secondary | ICD-10-CM | POA: Diagnosis not present

## 2023-01-22 DIAGNOSIS — D225 Melanocytic nevi of trunk: Secondary | ICD-10-CM | POA: Diagnosis not present

## 2023-01-22 DIAGNOSIS — R69 Illness, unspecified: Secondary | ICD-10-CM | POA: Diagnosis not present

## 2023-01-22 DIAGNOSIS — Z1283 Encounter for screening for malignant neoplasm of skin: Secondary | ICD-10-CM | POA: Diagnosis not present

## 2023-02-09 DIAGNOSIS — J039 Acute tonsillitis, unspecified: Secondary | ICD-10-CM | POA: Diagnosis not present

## 2023-02-27 ENCOUNTER — Other Ambulatory Visit (HOSPITAL_COMMUNITY): Payer: Self-pay

## 2023-03-03 ENCOUNTER — Encounter (HOSPITAL_COMMUNITY): Payer: Self-pay

## 2023-03-03 ENCOUNTER — Other Ambulatory Visit (HOSPITAL_COMMUNITY): Payer: Self-pay

## 2023-03-03 MED ORDER — ESTRADIOL 0.5 MG PO TABS
0.5000 mg | ORAL_TABLET | Freq: Every day | ORAL | 6 refills | Status: DC
  Filled 2023-03-03 – 2023-10-27 (×2): qty 90, 90d supply, fill #0
  Filled 2024-01-29: qty 90, 90d supply, fill #1

## 2023-03-03 MED ORDER — DIAZEPAM 5 MG PO TABS
ORAL_TABLET | ORAL | 0 refills | Status: DC
Start: 2023-03-03 — End: 2023-06-24
  Filled 2023-03-03 – 2023-03-05 (×2): qty 2, 1d supply, fill #0

## 2023-03-03 MED ORDER — ROSUVASTATIN CALCIUM 20 MG PO TABS
20.0000 mg | ORAL_TABLET | Freq: Every day | ORAL | 3 refills | Status: DC
  Filled 2023-03-03: qty 90, 90d supply, fill #0

## 2023-03-05 ENCOUNTER — Other Ambulatory Visit (HOSPITAL_COMMUNITY): Payer: Self-pay

## 2023-03-06 ENCOUNTER — Other Ambulatory Visit (HOSPITAL_COMMUNITY): Payer: Self-pay

## 2023-03-07 ENCOUNTER — Other Ambulatory Visit (HOSPITAL_BASED_OUTPATIENT_CLINIC_OR_DEPARTMENT_OTHER): Payer: Self-pay

## 2023-03-07 MED ORDER — FLUAD 0.5 ML IM SUSY
0.5000 mL | PREFILLED_SYRINGE | Freq: Once | INTRAMUSCULAR | 0 refills | Status: AC
  Filled 2023-03-07: qty 0.5, 1d supply, fill #0

## 2023-04-14 NOTE — Progress Notes (Signed)
67 y.o. G19P3003 Married Caucasian female here for a breast and pelvic exam.    The patient is also followed for pelvic organ prolapse and urinary urgency.  Has done pelvic floor PT. Managing ok.   Decreased libido.  Occasional discomfort with intercourse.    She had spinal fusion surgery in the past.  Has spinal stenosis.  Seeing a specialist and has a spinal ablation scheduled.  Taking Lyrica.   Her PCP has been prescribing her HRT.  She is now off the Prometrium and continues with oral Estradiol. She is asking if she should continue.   Her father has dementia.  Patient is a caregiver.    PCP: Merri Brunette, MD   Patient's last menstrual period was 04/29/2001 (lmp unknown).           Sexually active: Yes.    The current method of family planning is status post hysterectomy.    Menopausal hormone therapy:  estrace Exercising: Yes.     sagewell Smoker:  no  OB History     Gravida  3   Para  3   Term  3   Preterm      AB      Living  3      SAB      IAB      Ectopic      Multiple      Live Births              HEALTH MAINTENANCE: Last 2 paps: 2012 neg History of abnormal Pap or positive HPV:  yes, repeat pap normal Mammogram: 01/07/22 Breast Density Cat B, BI-RADS CAT 1 neg Colonoscopy:  08/12/16 Bone Density:  01/07/22  Result  osteopenia of left hip.  FRAX 16.3%/0.9%   Immunization History  Administered Date(s) Administered   Influenza Split 01/27/2013   Influenza,inj,Quad PF,6+ Mos 02/15/2019, 02/03/2021   Influenza,inj,quad, With Preservative 02/05/2018   Influenza-Unspecified 02/11/2020   Moderna SARS-COV2 Booster Vaccination 03/16/2020   Moderna Sars-Covid-2 Vaccination 04/27/2019, 05/24/2019   Rabies, IM 03/11/2011, 03/14/2011, 03/18/2011, 03/25/2011   Rabies, intradermal 03/11/2011   Tdap 04/29/2010, 07/19/2021   Zoster Recombinant(Shingrix) 07/03/2018      reports that she has never smoked. She has never used smokeless  tobacco. She reports current alcohol use. She reports that she does not use drugs.  Past Medical History:  Diagnosis Date   Abnormal Pap smear of cervix 2006   Repeat Pap normal   Allergy    Animal bite 03/18/2011   Dog Bite   Arthritis    Asthma    Classical migraine with intractable migraine 11/30/2015   Connective tissue disease, undifferentiated (HCC) 2015   Dr. Dierdre Forth   Female hypogonadism syndrome 01/13/2019   GERD (gastroesophageal reflux disease)    Hyperlipidemia    Hypertension    Migraine    Vitamin D deficiency disease 01/13/2019   Wears glasses     Past Surgical History:  Procedure Laterality Date   ABDOMINAL HYSTERECTOMY  2003   hyst   BACK SURGERY  2009   lumb lam-fusion   Basil cell     BREAST REDUCTION SURGERY  05/26/2012   Procedure: MAMMARY REDUCTION  (BREAST);  Surgeon: Pleas Kailei, MD;  Location: [facility];  Service: Plastics;  Laterality: Bilateral;   COLONOSCOPY  2003, 2018   diverticulosis    Current Outpatient Medications  Medication Sig Dispense Refill   Cholecalciferol (VITAMIN D-3) 125 MCG (5000 UT) TABS Take 10,000 Units by mouth daily.  diazepam (VALIUM) 5 MG tablet Take 1 tablet by mouth 1 hour prior to procedure. Bring 2nd tablet with you to appointment, if you still feel anxious prior to going into the procedure you are able to take the 2nd tablet. You should not drive after taking --You will need to arrange for a driver to and from your appointment. 2 tablet 0   estradiol (ESTRACE) 0.5 MG tablet Take 1 tablet (0.5 mg total) by mouth daily. 90 tablet 6   hydroxychloroquine (PLAQUENIL) 200 MG tablet Take 200 mg by mouth daily.     pregabalin (LYRICA) 75 MG capsule Take 1 capsule (75 mg total) by mouth 2 (two) times daily 180 capsule 0   Rimegepant Sulfate (NURTEC) 75 MG TBDP Dissolve 75 mg by mouth as needed (for migraine). Max dose 1 tablet in 24 hours 8 tablet 11   rosuvastatin (CRESTOR) 20 MG tablet Take 1 tablet (20  mg total) by mouth daily. 90 tablet 3   traZODone (DESYREL) 150 MG tablet Take 1 tablet (150 mg total) by mouth at bedtime. 90 tablet 3   valsartan-hydrochlorothiazide (DIOVAN-HCT) 160-12.5 MG tablet Take 1 tablet by mouth daily. 90 tablet 3   meloxicam (MOBIC) 7.5 MG tablet Take 1 tablet (7.5 mg total) by mouth daily as needed (Patient not taking: Reported on 04/28/2023) 30 tablet 0   progesterone (PROMETRIUM) 200 MG capsule Take 1 capsule (200 mg total) by mouth every evening. (Patient not taking: Reported on 04/28/2023) 90 capsule 1   No current facility-administered medications for this visit.    ALLERGIES: Morphine and codeine, Sulfa antibiotics, and Topamax [topiramate]  Family History  Problem Relation Age of Onset   Lung cancer Mother        never smoker   Heart disease Father    Dementia Father    Ulcerative colitis Brother    Heart failure Maternal Grandmother    ALS Maternal Grandfather    Melanoma Paternal Grandmother    Brain cancer Paternal Grandmother    Stroke Paternal Grandfather    Migraines Neg Hx    Colon cancer Neg Hx     Review of Systems  All other systems reviewed and are negative.   PHYSICAL EXAM:  BP 130/70 (BP Location: Left Arm, Patient Position: Sitting, Cuff Size: Small)   Pulse 86   Ht 5' 1.5" (1.562 m)   Wt 167 lb (75.8 kg)   LMP 04/29/2001 (LMP Unknown)   SpO2 95%   BMI 31.04 kg/m     General appearance: alert, cooperative and appears stated age Head: normocephalic, without obvious abnormality, atraumatic Neck: no adenopathy, supple, symmetrical, trachea midline and thyroid normal to inspection and palpation Lungs: clear to auscultation bilaterally Breasts: consistent with reduction, no masses or tenderness, No nipple retraction or dimpling, No nipple discharge or bleeding, No axillary adenopathy Heart: regular rate and rhythm Abdomen: soft, non-tender; no masses, no organomegaly Extremities: extremities normal, atraumatic, no cyanosis  or edema Skin: skin color, texture, turgor normal. No rashes or lesions Lymph nodes: cervical, supraclavicular, and axillary nodes normal. Neurologic: grossly normal  Pelvic: External genitalia:  no lesions              No abnormal inguinal nodes palpated.              Urethra:  normal appearing urethra with no masses, tenderness or lesions              Bartholins and Skenes: normal  Vagina: normal appearing vagina with normal color and discharge, no lesions.  Third degree cystocele and first degree rectocele.  Good vaginal vault support.               Cervix: absent              Pap taken: No. Bimanual Exam:  Uterus:  absent              Adnexa: no mass, fullness, tenderness              Rectal exam: Yes.  .  Confirms.              Anus:  normal sphincter tone, no lesions  Chaperone was present for exam:  Rosette Reveal, CMA  ASSESSMENT: Encounter for breast and pelvic exam.  Status post TAH.  Ovaries remain.  ERT. Third degree cystocele, first degree rectocele.   Hx urinary urgency.  Bilateral breast reduction.  Connective tissue disorder.  On Plaquenil. Decreased libido. Osteopenia.   PLAN: Mammogram screening discussed.  She will update. Self breast awareness reviewed. Pap and HRV collected:  no.  Not needed. Guidelines for Calcium, Vitamin D, regular exercise program including cardiovascular and weight bearing exercise. Medication refills:  NA We reviewed risks and benefits of ERT.  Risks may include increased risk of stroke, DVT, and PE.   Benefits are to reduce risk of osteoporosis.  Check testosterone levels.  Will then prescribe testosterone cream at [facility]. We discussed that this is a non FDA approved medication for treating decreased libido.  Side effects reviewed.  Patient will need a follow up visit in 6 weeks after starting testosterone therapy.  Will refer to urogyn at [facility].  BMD due in 2025.  Follow up:  1 year and prn.    35  min  total time was spent for this patient encounter, including preparation, face-to-face counseling with the patient, coordination of care, and documentation of the encounter in addition to doing the breast and pelvic exam.

## 2023-04-28 ENCOUNTER — Ambulatory Visit (INDEPENDENT_AMBULATORY_CARE_PROVIDER_SITE_OTHER): Payer: Medicare Other | Admitting: Obstetrics and Gynecology

## 2023-04-28 ENCOUNTER — Encounter: Payer: Self-pay | Admitting: Obstetrics and Gynecology

## 2023-04-28 ENCOUNTER — Telehealth: Payer: Self-pay | Admitting: Obstetrics and Gynecology

## 2023-04-28 VITALS — BP 130/70 | HR 86 | Ht 61.5 in | Wt 167.0 lb

## 2023-04-28 DIAGNOSIS — Z01419 Encounter for gynecological examination (general) (routine) without abnormal findings: Secondary | ICD-10-CM

## 2023-04-28 DIAGNOSIS — Z79899 Other long term (current) drug therapy: Secondary | ICD-10-CM

## 2023-04-28 DIAGNOSIS — N8110 Cystocele, unspecified: Secondary | ICD-10-CM

## 2023-04-28 DIAGNOSIS — N816 Rectocele: Secondary | ICD-10-CM | POA: Diagnosis not present

## 2023-04-28 DIAGNOSIS — R6882 Decreased libido: Secondary | ICD-10-CM | POA: Diagnosis not present

## 2023-04-28 DIAGNOSIS — R3915 Urgency of urination: Secondary | ICD-10-CM

## 2023-04-28 NOTE — Patient Instructions (Signed)

## 2023-04-28 NOTE — Telephone Encounter (Signed)
Please assist with referral to Urogyn with [facility].  My patient has a cystocele, rectocele, and urinary urgency.   She is considering surgical care.

## 2023-04-28 NOTE — Telephone Encounter (Signed)
Referral placed.   Routing to [employer].   Encounter closed.

## 2023-05-03 LAB — TESTOS,TOTAL,FREE AND SHBG (FEMALE)
Free Testosterone: 3.3 pg/mL (ref 0.1–6.4)
Sex Hormone Binding: 52.4 nmol/L (ref 14–73)
Testosterone, Total, LC-MS-MS: 26 ng/dL (ref 2–45)

## 2023-05-05 ENCOUNTER — Encounter: Payer: Self-pay | Admitting: Obstetrics

## 2023-05-05 ENCOUNTER — Ambulatory Visit (INDEPENDENT_AMBULATORY_CARE_PROVIDER_SITE_OTHER): Payer: Medicare Other | Admitting: Obstetrics

## 2023-05-05 VITALS — BP 126/80 | HR 86 | Ht 60.5 in | Wt 166.0 lb

## 2023-05-05 DIAGNOSIS — R102 Pelvic and perineal pain unspecified side: Secondary | ICD-10-CM | POA: Insufficient documentation

## 2023-05-05 DIAGNOSIS — N8110 Cystocele, unspecified: Secondary | ICD-10-CM | POA: Insufficient documentation

## 2023-05-05 DIAGNOSIS — N952 Postmenopausal atrophic vaginitis: Secondary | ICD-10-CM | POA: Insufficient documentation

## 2023-05-05 DIAGNOSIS — R159 Full incontinence of feces: Secondary | ICD-10-CM | POA: Diagnosis not present

## 2023-05-05 DIAGNOSIS — N3946 Mixed incontinence: Secondary | ICD-10-CM | POA: Insufficient documentation

## 2023-05-05 LAB — POCT URINALYSIS DIPSTICK
Bilirubin, UA: NEGATIVE
Blood, UA: NEGATIVE
Glucose, UA: NEGATIVE
Ketones, UA: NEGATIVE
Leukocytes, UA: NEGATIVE
Nitrite, UA: NEGATIVE
Protein, UA: NEGATIVE
Spec Grav, UA: 1.02 (ref 1.010–1.025)
Urobilinogen, UA: 0.2 U/dL
pH, UA: 7 (ref 5.0–8.0)

## 2023-05-05 MED ORDER — GEMTESA 75 MG PO TABS: 75.0000 mg | ORAL_TABLET | Freq: Every day | ORAL | 2 refills | Status: DC

## 2023-05-05 MED ORDER — ESTRADIOL 0.1 MG/GM VA CREA: 0.5000 g | TOPICAL_CREAM | VAGINAL | 3 refills | Status: DC

## 2023-05-05 MED ORDER — GEMTESA 75 MG PO TABS: 75.0000 mg | ORAL_TABLET | Freq: Every day | ORAL | 0 refills | Status: DC

## 2023-05-05 NOTE — Assessment & Plan Note (Addendum)
-   vaginal atrophy and left sided pelvic floor myofascial pain on exam - Rx low dose vaginal estrogen for atrophy, continue lubrication for intercourse - The origin of pelvic floor muscle spasm can be multifactorial, including primary, reactive to a different pain source, trauma, or even part of a centralized pain syndrome.Treatment options include pelvic floor physical therapy, local (vaginal) or oral  muscle relaxants, pelvic muscle trigger point injections or centrally acting pain medications.   - encourage to consider return to pelvic floor PT if persistent left sided pelvic pain on repeat exam after ablation for left sided back pain - encouraged pelvic relaxation exercises

## 2023-05-05 NOTE — Assessment & Plan Note (Signed)
-   takes PO estradiol  0.5mg  daily - reports vaginal dryness and discomfort with intercourse for 10 years - For symptomatic vaginal atrophy options include lubrication with a water-based lubricant, personal hygiene measures and barrier protection against wetness, and estrogen replacement in the form of vaginal cream, vaginal tablets, or a time-released vaginal ring.   - We discussed the potential risks associated with hormone replacement including stroke, heart attack, and blood clots; and the fact that these risks are very low with vaginal estrogen use due to the very low systemic absorption rate of ~ 0.01% with a twice-week regimen. - Rx low dose vaginal estrogen 0.5-1g twice a week

## 2023-05-05 NOTE — Assessment & Plan Note (Signed)
-   tried size 4 ring with support pessary in 2019, discontinued due to discomfort and pain - For treatment of pelvic organ prolapse, we discussed options for management including expectant management, conservative management, and surgical management, such as Kegels, a pessary, pelvic floor physical therapy, and specific surgical procedures. - reviewed instructions for Kegel exercises, stop if it worsens pain with intercourse - encouraged to consider pelvic floor PT - consider repeat trial of pessary after treatment of atrophy and pelvic floor myofascial pain

## 2023-05-05 NOTE — Assessment & Plan Note (Addendum)
-   only reports one incident last year with loose stool - takes probiotics, encouraged switching to fiber supplementation for stool consistency and history of diverticulosis - Treatment options include anti-diarrhea medication (loperamide/ Imodium OTC or prescription lomotil), fiber supplements, physical therapy, and possible sacral neuromodulation or surgery.   - encouraged Kegel exercises

## 2023-05-05 NOTE — Patient Instructions (Signed)
 You have a stage 2 (out of 4) prolapse.  We discussed the fact that it is not life threatening but there are several treatment options. For treatment of pelvic organ prolapse, we discussed options for management including expectant management, conservative management, and surgical management, such as Kegels, a pessary, pelvic floor physical therapy, and specific surgical procedures.     We discussed the symptoms of overactive bladder (OAB), which include urinary urgency, urinary frequency, night-time urination, with or without urge incontinence.  We discussed management including behavioral therapy (decreasing bladder irritants by following a bladder diet, urge suppression strategies, timed voids, bladder retraining), physical therapy, medication; and for refractory cases posterior tibial nerve stimulation, sacral neuromodulation, and intravesical botulinum toxin injection.   For Beta-3 agonist medication, we discussed the potential side effect of elevated blood pressure which is more likely to occur in individuals with uncontrolled hypertension. You were given samples for Gemtesa  75 mg.  It can take a month to start working so give it time, but if you have bothersome side effects call sooner and we can try a different medication.  Call us  if you have trouble filling the prescription or if it's not covered by your insurance.  For treatment of stress urinary incontinence, which is leakage with physical activity/movement/strainging/coughing, we discussed expectant management versus nonsurgical options versus surgery. Nonsurgical options include weight loss, physical therapy, as well as a pessary.  Surgical options include a midurethral sling, which is a synthetic mesh sling that acts like a hammock under the urethra to prevent leakage of urine, a Burch urethropexy, and transurethral injection of a bulking agent.   The origin of pelvic floor muscle spasm can be multifactorial, including primary, reactive to a  different pain source, trauma, or even part of a centralized pain syndrome.Treatment options include pelvic floor physical therapy, local (vaginal) or oral  muscle relaxants, pelvic muscle trigger point injections or centrally acting pain medications.      Start vaginal estrogen therapy nightly for two weeks then 2 times weekly at night for treatment of vaginal atrophy (dryness of the vaginal tissues).  Please let us  know if the prescription is too expensive and we can look for alternative options.

## 2023-05-05 NOTE — Progress Notes (Signed)
 New Patient Evaluation and Consultation  Referring Provider: Cathlyn JAYSON Nikki Bobie* PCP: Clarice Nottingham, MD Date of Service: 05/05/2023  SUBJECTIVE Chief Complaint: New Patient (Initial Visit) Victoria Cooper is a 68 y.o. female here for a prolapse.)  History of Present Illness: Victoria Cooper is a 68 y.o. White or Caucasian female seen in consultation at the request of Dr Cathlyn JAYSON Nikki for evaluation of stage III pelvic organ prolapse and urinary incontinence.    Reports leakage in college after laughing, worsened after vaginal deliveries Tried pelvic floor PT,  #4 ring with support pessary in 2019 for bulge symptoms. Discontinued due to discomfort. Denies history of vaginal estrogen.   HRT with PO estradiol  0.5mg  s/p hysterectomy with history of migraine, previously on testosterone  therapy for decreased libido. Plaquenil  for connective tissue disorder, previously with pain prior to treatment Lyrica  for spinal stenosis pending ablation Monday and s/p lumbar laminectomy in 2009  S/p TVH by Dr. Johnnye in 2004 for AUB, denies complications with ovaries in place.  Urinary Symptoms: Leaks urine with cough/ sneeze, laughing, lifting, with a full bladder, with movement to the bathroom, and with urgency More bothered by urgency leakage, increased in the past 10 years with weight gain and when mother was diagnosed with stage IV cancer Takes diuretic in the morning Leaks 5 time(s) per days with urgency, 1x/month with activity  Denies pad use, managed with underwear or clothing changes 1-2x/week for 6 months ago with worsening back pain Patient is bothered by UI symptoms.  Day time voids 10.  Nocturia: 1-2 times per night to void. Voiding dysfunction:  empties bladder well.  Patient does not use a catheter to empty bladder.  When urinating, patient feels dribbling after finishing and the need to urinate multiple times in a row Drinks: 64-80oz water per day due to dry mouth, leaks  less with decreases fluid intake. Drinks 2 cups of coffee/day   UTIs:  0  UTI's in the last year.   Denies history of blood in urine, kidney or bladder stones, pyelonephritis, bladder cancer, and kidney cancer No results found for the last 90 days.   Pelvic Organ Prolapse Symptoms:                  Patient Admits to a feeling of a bulge to the vaginal opening. It has been present for 10-15 years.  Patient Denies seeing a bulge.  This bulge is bothersome.  Bowel Symptom: Bowel movements: 1 time(s) per day Stool consistency: soft  Straining: no.  Splinting: no.  Incomplete evacuation: yes.  Patient Admits to accidental bowel leakage / fecal incontinence x 1 last year after 2hr long car ride  Consistency with leakage: soft with diarrhea Bowel regimen: diet and probiotics Last colonoscopy: Results moderate diverticulosis HM Colonoscopy          Upcoming     Colonoscopy (Every 10 Years) Next due on 08/13/2026    08/12/2016  Done - lec   Only the first 1 history entries have been loaded, but more history exists.                Sexual Function Sexually active: yes.  Sexual orientation: Straight Pain with sex: Yes, deep in the pelvis, has discomfort due to dryness started around 10-15 years ago  Pelvic Pain Denies pelvic pain  Past Medical History:  Past Medical History:  Diagnosis Date   Abnormal Pap smear of cervix 2006   Repeat Pap normal   Allergy  Animal bite 03/18/2011   Dog Bite   Arthritis    Asthma    Classical migraine with intractable migraine 11/30/2015   Connective tissue disease, undifferentiated (HCC) 2015   Dr. Mai   Female hypogonadism syndrome 01/13/2019   GERD (gastroesophageal reflux disease)    Hyperlipidemia    Hypertension    Migraine    Vitamin D deficiency disease 01/13/2019   Wears glasses      Past Surgical History:   Past Surgical History:  Procedure Laterality Date   BACK SURGERY  2009   lumb lam-fusion   Basil cell      BREAST REDUCTION SURGERY  05/26/2012   Procedure: MAMMARY REDUCTION  (BREAST);  Surgeon: Kayla Pray, MD;  Location: [facility];  Service: Plastics;  Laterality: Bilateral;   COLONOSCOPY  2003, 2018   diverticulosis   VAGINAL HYSTERECTOMY  2003   hyst     Past OB/GYN History: OB History  Gravida Para Term Preterm AB Living  3 3 3   3   SAB IAB Ectopic Multiple Live Births      3    # Outcome Date GA Lbr Len/2nd Weight Sex Type Anes PTL Lv  3 Term 03/08/85   9 lb 9 oz (4.338 kg) F Vag-Spont     2 Term 06/29/82   9 lb 2 oz (4.139 kg) F Vag-Spont     1 Term 11/07/80   10 lb 4 oz (4.649 kg) M Vag-Spont       Vaginal deliveries: 3,  Forceps/ Vacuum deliveries: 1 forcep delivery with 1st baby, Cesarean section: 0 Menopausal: Yes, at age 46s, Denies vaginal bleeding since menopause Contraception: s/p menopause. Last pap smear was negative in 2012 per chart review.  Any history of abnormal pap smears: no, denies history of CKC or LEEP. No results found for: DIAGPAP, HPVHIGH, ADEQPAP  Medications: Patient has a current medication list which includes the following prescription(s): vitamin d-3, diazepam , [START ON 05/08/2023] estradiol , estradiol , hydroxychloroquine , pregabalin , nurtec, rosuvastatin , trazodone , valsartan -hydrochlorothiazide , gemtesa , and gemtesa .   Allergies: Patient is allergic to morphine and codeine , sulfa antibiotics, and topamax  [topiramate ].   Social History:  Social History   Tobacco Use   Smoking status: Never   Smokeless tobacco: Never  Vaping Use   Vaping status: Never Used  Substance Use Topics   Alcohol use: Yes    Alcohol/week: 0.0 - 2.0 standard drinks of alcohol   Drug use: No    Relationship status: married Patient lives with her husband.   Patient is not employed. Regular exercise: Yes: 3x/week at fitness center History of abuse: No  Family History:   Family History  Problem Relation Age of Onset   Lung cancer  Mother        never smoker   Heart disease Father    Dementia Father    Ulcerative colitis Brother    Heart failure Maternal Grandmother    ALS Maternal Grandfather    Melanoma Paternal Grandmother    Brain cancer Paternal Grandmother    Stroke Paternal Grandfather    Migraines Neg Hx    Colon cancer Neg Hx      Review of Systems: Review of Systems  Constitutional:  Negative for fever, malaise/fatigue and weight loss.       Weight gain  Respiratory:  Negative for cough, shortness of breath and wheezing.   Cardiovascular:  Negative for chest pain, palpitations and leg swelling.  Gastrointestinal:  Negative for abdominal pain, blood in stool and constipation.  Genitourinary:  Positive for frequency and urgency. Negative for dysuria and hematuria.       Leakage  Skin:  Negative for rash.  Neurological:  Negative for dizziness, weakness and headaches.  Endo/Heme/Allergies:  Does not bruise/bleed easily.       Hot flashes  Psychiatric/Behavioral:  Negative for depression. The patient is not nervous/anxious.      OBJECTIVE Physical Exam: Vitals:   05/05/23 1346  BP: 126/80  Pulse: 86  Weight: 166 lb (75.3 kg)  Height: 5' 0.5 (1.537 m)    Physical Exam Constitutional:      General: She is not in acute distress.    Appearance: Normal appearance.  Genitourinary:     Bladder and urethral meatus normal.     No lesions in the vagina.     Right Labia: No rash, tenderness, lesions, skin changes or Bartholin's cyst.    Left Labia: No tenderness, lesions, skin changes, Bartholin's cyst or rash.    No vaginal discharge, erythema, tenderness, bleeding, ulceration or granulation tissue.     Anterior and apical vaginal prolapse present.    Mild vaginal atrophy present.     Right Adnexa: not tender, not full and no mass present.    Left Adnexa: not tender, not full and no mass present.    Cervix is absent.     Uterus is absent.     Urethral meatus caruncle not present.    No  urethral prolapse, tenderness, mass, hypermobility, discharge or stress urinary incontinence with cough stress test present.     Bladder is not tender, urgency on palpation not present and masses not present.      Levator ani is tender (left sided) and obturator internus is tender (left sided).     No asymmetrical contractions present and no pelvic spasms present.    BC reflex absent.     Symmetrical pelvic sensation and anal wink present. Cardiovascular:     Rate and Rhythm: Normal rate.  Pulmonary:     Effort: Pulmonary effort is normal. No respiratory distress.  Abdominal:     General: Abdomen is flat. There is no distension.     Palpations: Abdomen is soft. There is no mass.     Tenderness: There is no abdominal tenderness.     Hernia: No hernia is present.  Neurological:     Mental Status: She is alert.  Vitals reviewed. Exam conducted with a chaperone present.      POP-Q:   POP-Q  1                                            Aa   1                                           Ba  -3                                              C   4  Gh  3                                            Pb  6                                            tvl   -2                                            Ap  -2                                            Bp                                                 D    Post-Void Residual (PVR) by Bladder Scan: In order to evaluate bladder emptying, we discussed obtaining a postvoid residual and patient agreed to this procedure.  Procedure: The ultrasound unit was placed on the patient's abdomen in the suprapubic region after the patient had voided.    Post Void Residual - 05/05/23 1401       Post Void Residual   Post Void Residual 8 mL              Laboratory Results: Lab Results  Component Value Date   COLORU Yellow 05/05/2023   CLARITYU Clear 05/05/2023   GLUCOSEUR Negative 05/05/2023    BILIRUBINUR Negative 05/05/2023   KETONESU Negative 05/05/2023   SPECGRAV 1.020 05/05/2023   RBCUR Negative 05/05/2023   PHUR 7.0 05/05/2023   PROTEINUR Negative 05/05/2023   UROBILINOGEN 0.2 05/05/2023   LEUKOCYTESUR Negative 05/05/2023    Lab Results  Component Value Date   CREATININE 0.60 01/23/2022   CREATININE 0.64 01/23/2022   CREATININE 0.69 05/09/2020    No results found for: HGBA1C  Lab Results  Component Value Date   HGB 14.3 01/23/2022     ASSESSMENT AND PLAN Ms. Biel is a 68 y.o. with:  1. Mixed stress and urge urinary incontinence   2. Incontinence of feces, unspecified fecal incontinence type   3. Vaginal atrophy   4. Pelvic pain   5. Pelvic organ prolapse quantification stage 2 cystocele     Mixed stress and urge urinary incontinence Assessment & Plan: - POCT UA negative - urgency > stress, SUI since college - We discussed the symptoms of overactive bladder (OAB), which include urinary urgency, urinary frequency, nocturia, with or without urge incontinence.  While we do not know the exact etiology of OAB, several treatment options exist. We discussed management including behavioral therapy (decreasing bladder irritants, urge suppression strategies, timed voids, bladder retraining), physical therapy, medication; for refractory cases posterior tibial nerve stimulation, sacral neuromodulation, and intravesical botulinum toxin injection.  For anticholinergic medications, we discussed the potential side effects of anticholinergics including dry eyes, dry mouth, constipation, cognitive impairment and urinary retention. For Beta-3 agonist medication,  we discussed the potential side effect of elevated blood pressure which is more likely to occur in individuals with uncontrolled hypertension. - samples and Rx for trial of Gemtesa  - discussed association of weight gain with urinary symptoms - consider return to pelvic floor PT if refractory symptoms - resume  Kegel exercises, encouraged fluid management, bladder training and caffeine reduction  Orders: -     POCT urinalysis dipstick -     Gemtesa ; Take 1 tablet (75 mg total) by mouth daily.  Dispense: 30 tablet; Refill: 2 -     Gemtesa ; Take 1 tablet (75 mg total) by mouth daily.  Dispense: 28 tablet; Refill: 0  Incontinence of feces, unspecified fecal incontinence type Assessment & Plan: - only reports one incident last year with loose stool - takes probiotics, encouraged switching to fiber supplementation for stool consistency and history of diverticulosis - Treatment options include anti-diarrhea medication (loperamide/ Imodium OTC or prescription lomotil), fiber supplements, physical therapy, and possible sacral neuromodulation or surgery.   - encouraged Kegel exercises   Vaginal atrophy Assessment & Plan: - takes PO estradiol  0.5mg  daily - reports vaginal dryness and discomfort with intercourse for 10 years - For symptomatic vaginal atrophy options include lubrication with a water-based lubricant, personal hygiene measures and barrier protection against wetness, and estrogen replacement in the form of vaginal cream, vaginal tablets, or a time-released vaginal ring.   - We discussed the potential risks associated with hormone replacement including stroke, heart attack, and blood clots; and the fact that these risks are very low with vaginal estrogen use due to the very low systemic absorption rate of ~ 0.01% with a twice-week regimen. - Rx low dose vaginal estrogen 0.5-1g twice a week  Orders: -     Estradiol ; Place 0.5 g vaginally 2 (two) times a week. Place 0.5g nightly for two weeks then twice a week after  Dispense: 30 g; Refill: 3  Pelvic pain Assessment & Plan: - vaginal atrophy and left sided pelvic floor myofascial pain on exam - Rx low dose vaginal estrogen for atrophy, continue lubrication for intercourse - The origin of pelvic floor muscle spasm can be multifactorial,  including primary, reactive to a different pain source, trauma, or even part of a centralized pain syndrome.Treatment options include pelvic floor physical therapy, local (vaginal) or oral  muscle relaxants, pelvic muscle trigger point injections or centrally acting pain medications.   - encourage to consider return to pelvic floor PT if persistent left sided pelvic pain on repeat exam after ablation for left sided back pain - encouraged pelvic relaxation exercises   Pelvic organ prolapse quantification stage 2 cystocele Assessment & Plan: - tried size 4 ring with support pessary in 2019, discontinued due to discomfort and pain - For treatment of pelvic organ prolapse, we discussed options for management including expectant management, conservative management, and surgical management, such as Kegels, a pessary, pelvic floor physical therapy, and specific surgical procedures. - reviewed instructions for Kegel exercises, stop if it worsens pain with intercourse - encouraged to consider pelvic floor PT - consider repeat trial of pessary after treatment of atrophy and pelvic floor myofascial pain   Time spent: I spent 61 minutes dedicated to the care of this patient on the date of this encounter to include pre-visit review of records, face-to-face time with the patient discussing stage II pelvic organ prolapse, mixed urinary incontinence, pelvic pain, vaginal atrophy, and post visit documentation and ordering medication/ testing.   Lianne ONEIDA Gillis, MD

## 2023-05-05 NOTE — Assessment & Plan Note (Addendum)
-   POCT UA negative - urgency > stress, SUI since college - We discussed the symptoms of overactive bladder (OAB), which include urinary urgency, urinary frequency, nocturia, with or without urge incontinence.  While we do not know the exact etiology of OAB, several treatment options exist. We discussed management including behavioral therapy (decreasing bladder irritants, urge suppression strategies, timed voids, bladder retraining), physical therapy, medication; for refractory cases posterior tibial nerve stimulation, sacral neuromodulation, and intravesical botulinum toxin injection.  For anticholinergic medications, we discussed the potential side effects of anticholinergics including dry eyes, dry mouth, constipation, cognitive impairment and urinary retention. For Beta-3 agonist medication, we discussed the potential side effect of elevated blood pressure which is more likely to occur in individuals with uncontrolled hypertension. - samples and Rx for trial of Gemtesa  - discussed association of weight gain with urinary symptoms - consider return to pelvic floor PT if refractory symptoms - resume Kegel exercises, encouraged fluid management, bladder training and caffeine reduction

## 2023-05-13 ENCOUNTER — Other Ambulatory Visit: Payer: Self-pay | Admitting: Obstetrics and Gynecology

## 2023-05-13 DIAGNOSIS — Z1231 Encounter for screening mammogram for malignant neoplasm of breast: Secondary | ICD-10-CM

## 2023-05-28 ENCOUNTER — Ambulatory Visit
Admission: RE | Admit: 2023-05-28 | Discharge: 2023-05-28 | Disposition: A | Discharge status: Home / Self Care | Payer: Medicare Other | Source: Ambulatory Visit

## 2023-05-28 DIAGNOSIS — Z1231 Encounter for screening mammogram for malignant neoplasm of breast: Secondary | ICD-10-CM

## 2023-05-28 NOTE — Progress Notes (Deleted)
 GYNECOLOGY  VISIT   HPI: 68 y.o.   Married  Caucasian female   [phone] with Patient's last menstrual period was 04/29/2001 (lmp unknown).   here for: 6 week lab f/u     GYNECOLOGIC HISTORY: Patient's last menstrual period was 04/29/2001 (lmp unknown). Contraception:  hyst Menopausal hormone therapy:  estrace Last 2 paps:  2012 neg History of abnormal Pap or positive HPV:  yes, repeat pap normal Mammogram:  01/07/22 Breast Density Cat B, BI-RADS CAT 1 neg         OB History     Gravida  3   Para  3   Term  3   Preterm      AB      Living  3      SAB      IAB      Ectopic      Multiple      Live Births  3              Patient Active Problem List   Diagnosis Date Noted   Vaginal atrophy 05/05/2023   Incontinence of feces 05/05/2023   Mixed stress and urge urinary incontinence 05/05/2023   Pelvic pain 05/05/2023   Pelvic organ prolapse quantification stage 2 cystocele 05/05/2023   Female hypogonadism syndrome 01/13/2019   Vitamin D deficiency disease 01/13/2019   Classical migraine with intractable migraine 11/30/2015   Upper airway cough syndrome 04/07/2013   Essential hypertension, benign 04/07/2013   Extrinsic asthma 03/05/2013   Animal bite 03/18/2011    Past Medical History:  Diagnosis Date   Abnormal Pap smear of cervix 2006   Repeat Pap normal   Allergy    Animal bite 03/18/2011   Dog Bite   Arthritis    Asthma    Classical migraine with intractable migraine 11/30/2015   Connective tissue disease, undifferentiated (HCC) 2015   Dr. Dierdre Forth   Female hypogonadism syndrome 01/13/2019   GERD (gastroesophageal reflux disease)    Hyperlipidemia    Hypertension    Migraine    Vitamin D deficiency disease 01/13/2019   Wears glasses     Past Surgical History:  Procedure Laterality Date   BACK SURGERY  2009   lumb lam-fusion   Basil cell     BREAST REDUCTION SURGERY  05/26/2012   Procedure: MAMMARY REDUCTION  (BREAST);  Surgeon: Pleas Aron, MD;  Location: [facility];  Service: Plastics;  Laterality: Bilateral;   COLONOSCOPY  2003, 2018   diverticulosis   VAGINAL HYSTERECTOMY  2003   hyst    Current Outpatient Medications  Medication Sig Dispense Refill   Cholecalciferol (VITAMIN D-3) 125 MCG (5000 UT) TABS Take 10,000 Units by mouth daily.     diazepam (VALIUM) 5 MG tablet Take 1 tablet by mouth 1 hour prior to procedure. Bring 2nd tablet with you to appointment, if you still feel anxious prior to going into the procedure you are able to take the 2nd tablet. You should not drive after taking --You will need to arrange for a driver to and from your appointment. 2 tablet 0   estradiol (ESTRACE) 0.1 MG/GM vaginal cream Place 0.5 g vaginally 2 (two) times a week. Place 0.5g nightly for two weeks then twice a week after 30 g 3   estradiol (ESTRACE) 0.5 MG tablet Take 1 tablet (0.5 mg total) by mouth daily. 90 tablet 6   hydroxychloroquine (PLAQUENIL) 200 MG tablet Take 200 mg by mouth daily.  pregabalin (LYRICA) 75 MG capsule Take 1 capsule (75 mg total) by mouth 2 (two) times daily 180 capsule 0   Rimegepant Sulfate (NURTEC) 75 MG TBDP Dissolve 75 mg by mouth as needed (for migraine). Max dose 1 tablet in 24 hours 8 tablet 11   rosuvastatin (CRESTOR) 20 MG tablet Take 1 tablet (20 mg total) by mouth daily. 90 tablet 3   traZODone (DESYREL) 150 MG tablet Take 1 tablet (150 mg total) by mouth at bedtime. 90 tablet 3   valsartan-hydrochlorothiazide (DIOVAN-HCT) 160-12.5 MG tablet Take 1 tablet by mouth daily. 90 tablet 3   Vibegron (GEMTESA) 75 MG TABS Take 1 tablet (75 mg total) by mouth daily. 30 tablet 2   Vibegron (GEMTESA) 75 MG TABS Take 1 tablet (75 mg total) by mouth daily. 28 tablet 0   No current facility-administered medications for this visit.     ALLERGIES: Morphine and codeine, Sulfa antibiotics, and Topamax [topiramate]  Family History  Problem Relation Age of Onset   Lung cancer Mother         never smoker   Heart disease Father    Dementia Father    Ulcerative colitis Brother    Heart failure Maternal Grandmother    ALS Maternal Grandfather    Melanoma Paternal Grandmother    Brain cancer Paternal Grandmother    Stroke Paternal Grandfather    Migraines Neg Hx    Colon cancer Neg Hx     Social History   Socioeconomic History   Marital status: Married    Spouse name: Brett Canales   Number of children: 3   Years of education: Master's   Highest education level: Not on file  Occupational History   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   Right-handed   Caffeine: 1-3 cups of coffee per day   Social Drivers of Health   Financial Resource Strain: Low Risk  (10/30/2022)   Received from [facility]   Overall Financial Resource Strain (CARDIA)    Difficulty of Paying Living Expenses: Not hard at all  Food Insecurity: No Food Insecurity (10/30/2022)   Received from [facility]   Hunger Vital Sign    Worried About Running Out of Food in the Last Year: Never true    Ran Out of Food in the Last Year: Never true  Transportation Needs: No Transportation Needs (10/30/2022)   Received [facility] - [facility]    In the past 12 months, has lack of transportation kept you from medical appointments or from getting medications?: No    Lack of Transportation (Non-Medical): No   Physical Activity: Not on file  Stress: Not on file  Social Connections: Not on file  Intimate Partner Violence: Not on file    Review of Systems  PHYSICAL EXAMINATION:  LMP 04/29/2001 (LMP Unknown)     General appearance: alert, cooperative and appears stated age Head: Normocephalic, without obvious abnormality, atraumatic Neck: no adenopathy, supple, symmetrical, trachea midline and thyroid normal to inspection and palpation Lungs: clear to auscultation bilaterally Breasts: normal appearance, no masses or tenderness, No nipple retraction or dimpling, No nipple discharge or bleeding, No axillary or supraclavicular adenopathy Heart: regular rate and rhythm Abdomen: soft, non-tender, no masses,  no organomegaly Extremities: extremities normal, atraumatic, no cyanosis or edema Skin: Skin color, texture, turgor normal. No rashes or lesions Lymph nodes: Cervical, supraclavicular, and axillary nodes normal. No abnormal inguinal nodes palpated Neurologic: Grossly normal  Pelvic: External genitalia:  no lesions              Urethra:  normal appearing urethra with no masses, tenderness or lesions              Bartholins and Skenes: normal                 Vagina: normal appearing vagina with normal color and discharge, no lesions              Cervix: no lesions                Bimanual Exam:  Uterus:  normal size, contour, position, consistency, mobility, non-tender              Adnexa: no mass, fullness, tenderness              Rectal exam: {yes no:314532}.  Confirms.              Anus:  normal sphincter tone, no lesions  Chaperone was present for exam:  {BSCHAPERONE:31226::"Lameshia Hypolite F, CMA"}  ASSESSMENT:    PLAN:    {LABS (Optional):23779}  ***  total time was spent for this patient encounter, including preparation, face-to-face counseling with the patient, coordination of care, and documentation of the encounter.

## 2023-05-29 ENCOUNTER — Encounter: Payer: Self-pay | Admitting: Obstetrics and Gynecology

## 2023-06-11 ENCOUNTER — Ambulatory Visit: Payer: Medicare Other | Admitting: Obstetrics and Gynecology

## 2023-06-18 ENCOUNTER — Telehealth: Payer: Self-pay

## 2023-06-18 ENCOUNTER — Other Ambulatory Visit (HOSPITAL_COMMUNITY): Payer: Self-pay

## 2023-06-18 NOTE — Telephone Encounter (Signed)
*  [facility]  Pharmacy Patient Advocate Encounter   Received notification from CoverMyMeds that prior authorization for Nurtec is required/requested.   Insurance verification completed.   The patient is insured through [employer] .   Per test claim: PA required; PA submitted to above mentioned insurance via CoverMyMeds Key/confirmation #/EOC [id number] Status is pending

## 2023-06-19 ENCOUNTER — Encounter: Payer: Self-pay | Admitting: Family Medicine

## 2023-06-19 NOTE — Telephone Encounter (Signed)
Pharmacy Patient Advocate Encounter  Received notification from [facility] that Prior Authorization for Nurtec 75MG  dispersible tablets has been DENIED.  Full denial letter will be uploaded to the media tab. See denial reason below.    PA #/Case ID/Reference #: PA Case ID #: [id number]

## 2023-06-23 ENCOUNTER — Other Ambulatory Visit (HOSPITAL_COMMUNITY): Payer: Self-pay

## 2023-06-23 ENCOUNTER — Other Ambulatory Visit: Payer: Self-pay

## 2023-06-23 ENCOUNTER — Ambulatory Visit: Payer: Medicare Other | Admitting: Obstetrics and Gynecology

## 2023-06-23 MED ORDER — UBRELVY 100 MG PO TABS
100.0000 mg | ORAL_TABLET | Freq: Every day | ORAL | 11 refills | Status: DC | PRN
  Filled 2023-06-23: qty 8, 30d supply, fill #0

## 2023-06-23 NOTE — Telephone Encounter (Signed)
 Called and relayed msg pt voiced understanding and agreeable to [place]. Routing to amy lomax to send new rx

## 2023-06-23 NOTE — Addendum Note (Signed)
 Addended by: Shawnie Dapper L on: 06/23/2023 08:58 AM   Modules accepted: Orders

## 2023-06-24 ENCOUNTER — Ambulatory Visit (INDEPENDENT_AMBULATORY_CARE_PROVIDER_SITE_OTHER): Payer: Medicare Other | Admitting: Obstetrics and Gynecology

## 2023-06-24 ENCOUNTER — Encounter: Payer: Self-pay | Admitting: Obstetrics and Gynecology

## 2023-06-24 VITALS — BP 116/64 | HR 85 | Wt 166.0 lb

## 2023-06-24 DIAGNOSIS — R6882 Decreased libido: Secondary | ICD-10-CM | POA: Diagnosis not present

## 2023-06-24 MED ORDER — NONFORMULARY OR COMPOUNDED ITEM: 0 refills | Status: AC

## 2023-06-24 NOTE — Progress Notes (Signed)
 GYNECOLOGY  VISIT   HPI: 68 y.o.   Married  Caucasian female   [phone] with Patient's last menstrual period was 04/29/2001 (lmp unknown).   here for: 6 week lab follow up.    Interested in taking testosterone.  Her libido is low. In the past, it helped with her energy level.   Total testosterone 26 and free testosterone 3.3 on 04/28/23.   Had a spinal ablation 4 weeks ago.  Trip to the [place]. Has been able to do [place] after the procedure.   GYNECOLOGIC HISTORY: Patient's last menstrual period was 04/29/2001 (lmp unknown). Contraception:  hyst Menopausal hormone therapy:  estrace Last 2 paps:  2012 neg History of abnormal Pap or positive HPV:  yes, repeat pap normal Mammogram:  05/28/23 Breast Density Cat B, BI-RADS CAT 1 neg         OB History     Gravida  3   Para  3   Term  3   Preterm      AB      Living  3      SAB      IAB      Ectopic      Multiple      Live Births  3              Patient Active Problem List   Diagnosis Date Noted   Vaginal atrophy 05/05/2023   Incontinence of feces 05/05/2023   Mixed stress and urge urinary incontinence 05/05/2023   Pelvic pain 05/05/2023   Pelvic organ prolapse quantification stage 2 cystocele 05/05/2023   Female hypogonadism syndrome 01/13/2019   Vitamin D deficiency disease 01/13/2019   Classical migraine with intractable migraine 11/30/2015   Upper airway cough syndrome 04/07/2013   Essential hypertension, benign 04/07/2013   Extrinsic asthma 03/05/2013   Animal bite 03/18/2011    Past Medical History:  Diagnosis Date   Abnormal Pap smear of cervix 2006   Repeat Pap normal   Allergy    Animal bite 03/18/2011   Dog Bite   Arthritis    Asthma    Classical migraine with intractable migraine 11/30/2015   Connective tissue disease, undifferentiated (HCC) 2015   Dr. Dierdre Forth   Female hypogonadism syndrome 01/13/2019   GERD (gastroesophageal reflux disease)    Hyperlipidemia     Hypertension    Migraine    Vitamin D deficiency disease 01/13/2019   Wears glasses     Past Surgical History:  Procedure Laterality Date   BACK SURGERY  2009   lumb lam-fusion   Basil cell     BREAST REDUCTION SURGERY  05/26/2012   Procedure: MAMMARY REDUCTION  (BREAST);  Surgeon: Pleas Haylyn, MD;  Location: [facility];  Service: Plastics;  Laterality: Bilateral;   COLONOSCOPY  2003, 2018   diverticulosis   VAGINAL HYSTERECTOMY  2003   hyst    Current Outpatient Medications  Medication Sig Dispense Refill   Cholecalciferol (VITAMIN D-3) 125 MCG (5000 UT) TABS Take 10,000 Units by mouth daily.     estradiol (ESTRACE) 0.1 MG/GM vaginal cream Place 0.5 g vaginally 2 (two) times a week. Place 0.5g nightly for two weeks then twice a week after 30 g 3   estradiol (ESTRACE) 0.5 MG tablet Take 1 tablet (0.5 mg total) by mouth daily. 90 tablet 6   hydroxychloroquine (PLAQUENIL) 200 MG tablet Take 200 mg by mouth daily.     pregabalin (LYRICA) 75 MG capsule Take 1 capsule (75  mg total) by mouth 2 (two) times daily 180 capsule 0   rosuvastatin (CRESTOR) 20 MG tablet Take 1 tablet (20 mg total) by mouth daily. 90 tablet 3   traZODone (DESYREL) 150 MG tablet Take 1 tablet (150 mg total) by mouth at bedtime. 90 tablet 3   Ubrogepant (UBRELVY) 100 MG TABS Take 1 tablet (100 mg total) by mouth daily as needed. Take one tablet at onset of headache, may repeat 1 tablet in 2 hours, no more than 2 tablets in 24 hours 8 tablet 11   valsartan-hydrochlorothiazide (DIOVAN-HCT) 160-12.5 MG tablet Take 1 tablet by mouth daily. 90 tablet 3   Vibegron (GEMTESA) 75 MG TABS Take 1 tablet (75 mg total) by mouth daily. 30 tablet 2   Vibegron (GEMTESA) 75 MG TABS Take 1 tablet (75 mg total) by mouth daily. 28 tablet 0   No current facility-administered medications for this visit.     ALLERGIES: Morphine and codeine, Sulfa antibiotics, and Topamax [topiramate]  Family History  Problem  Relation Age of Onset   Lung cancer Mother        never smoker   Heart disease Father    Dementia Father    Ulcerative colitis Brother    Heart failure Maternal Grandmother    ALS Maternal Grandfather    Melanoma Paternal Grandmother    Brain cancer Paternal Grandmother    Stroke Paternal Grandfather    Migraines Neg Hx    Colon cancer Neg Hx     Social History   Socioeconomic History   Marital status: Married    Spouse name: Brett Canales   Number of children: 3   Years of education: Master's   Highest education level: Not on file  Occupational History   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   Right-handed   Caffeine: 1-3 cups of coffee per day   Social Drivers of Health   Financial Resource Strain: Low Risk  (10/30/2022)   Received from [facility]   Overall Financial Resource Strain (CARDIA)    Difficulty of Paying Living Expenses: Not hard at all  Food Insecurity: No Food Insecurity (10/30/2022)   Received from [facility]   Hunger Vital Sign    Worried About Running Out of Food in the Last Year: Never true    Ran Out of Food in the Last Year: Never true  Transportation Needs: No Transportation Needs (10/30/2022)   Received from [facility] - Transportation    In the past 12 months, has lack of transportation kept you from medical appointments or from getting medications?: No     Lack of Transportation (Non-Medical): No  Physical Activity: Not on file  Stress: Not on file  Social Connections: Not on file  Intimate Partner Violence: Not on file  Review of Systems  All other systems reviewed and are negative.   PHYSICAL EXAMINATION:   BP 116/64 (BP Location: Left Arm, Patient Position: Sitting, Cuff Size: Small)   Pulse 85   Wt 166 lb (75.3 kg)   LMP 04/29/2001 (LMP Unknown)   SpO2 96%   BMI 31.89 kg/m     General appearance: alert, cooperative and appears stated age   ASSESSMENT:  Decreased libido.   PLAN:  We talked about benefits and risks of testosterone therapy.  Rx for compounded testosterone cream 0.5 mg daily to skin of arm, leg, or abdomen and rotate site. Disp:  60 grams, RF none.  Prefers Forensic psychologist.  Fu in 6 weeks for a recheck appointment and lab visit.   20 min  total time was spent for this patient encounter, including preparation, face-to-face counseling with the patient, coordination of care, and documentation of the encounter.

## 2023-07-03 ENCOUNTER — Ambulatory Visit: Payer: Medicare Other | Admitting: Obstetrics

## 2023-07-29 NOTE — Progress Notes (Deleted)
 GYNECOLOGY  VISIT   HPI: 68 y.o.   Married  Caucasian female   [phone] with Patient's last menstrual period was 04/29/2001 (lmp unknown).   here for: med check     GYNECOLOGIC HISTORY: Patient's last menstrual period was 04/29/2001 (lmp unknown). Contraception:  hyst Menopausal hormone therapy:  estrace Last 2 paps:  2012 neg History of abnormal Pap or positive HPV:  yes, repeat pap normal Mammogram:  05/28/23 Breast Density Cat B, BI-RADS CAT 1 neg         OB History     Gravida  3   Para  3   Term  3   Preterm      AB      Living  3      SAB      IAB      Ectopic      Multiple      Live Births  3              Patient Active Problem List   Diagnosis Date Noted   Vaginal atrophy 05/05/2023   Incontinence of feces 05/05/2023   Mixed stress and urge urinary incontinence 05/05/2023   Pelvic pain 05/05/2023   Pelvic organ prolapse quantification stage 2 cystocele 05/05/2023   Female hypogonadism syndrome 01/13/2019   Vitamin D deficiency disease 01/13/2019   Classical migraine with intractable migraine 11/30/2015   Upper airway cough syndrome 04/07/2013   Essential hypertension, benign 04/07/2013   Extrinsic asthma 03/05/2013   Animal bite 03/18/2011    Past Medical History:  Diagnosis Date   Abnormal Pap smear of cervix 2006   Repeat Pap normal   Allergy    Animal bite 03/18/2011   Dog Bite   Arthritis    Asthma    Classical migraine with intractable migraine 11/30/2015   Connective tissue disease, undifferentiated (HCC) 2015   Dr. Dierdre Forth   Female hypogonadism syndrome 01/13/2019   GERD (gastroesophageal reflux disease)    Hyperlipidemia    Hypertension    Migraine    Vitamin D deficiency disease 01/13/2019   Wears glasses     Past Surgical History:  Procedure Laterality Date   BACK SURGERY  2009   lumb lam-fusion   Basil cell     BREAST REDUCTION SURGERY  05/26/2012   Procedure: MAMMARY REDUCTION  (BREAST);  Surgeon: Pleas Kourtnei, MD;  Location: [facility];  Service: Plastics;  Laterality: Bilateral;   COLONOSCOPY  2003, 2018   diverticulosis   VAGINAL HYSTERECTOMY  2003   hyst    Current Outpatient Medications  Medication Sig Dispense Refill   Cholecalciferol (VITAMIN D-3) 125 MCG (5000 UT) TABS Take 10,000 Units by mouth daily.     estradiol (ESTRACE) 0.1 MG/GM vaginal cream Place 0.5 g vaginally 2 (two) times a week. Place 0.5g nightly for two weeks then twice a week after 30 g 3   estradiol (ESTRACE) 0.5 MG tablet Take 1 tablet (0.5 mg total) by mouth daily. 90 tablet 6   hydroxychloroquine (PLAQUENIL) 200 MG tablet Take 200 mg by mouth daily.     NONFORMULARY OR COMPOUNDED ITEM Testosterone cream 0.1%.  Apply 0.5 grams daily to skin of arm, leg, or abdomen and rotate site.  Disp:  60 grams, RF:  none.  [facility]. 60 each 0   pregabalin (LYRICA) 75 MG capsule Take 1 capsule (75 mg total) by mouth 2 (two) times daily 180 capsule 0   rosuvastatin (CRESTOR) 20 MG tablet  Take 1 tablet (20 mg total) by mouth daily. 90 tablet 3   traZODone (DESYREL) 150 MG tablet Take 1 tablet (150 mg total) by mouth at bedtime. 90 tablet 3   Ubrogepant (UBRELVY) 100 MG TABS Take 1 tablet (100 mg total) by mouth daily as needed. Take one tablet at onset of headache, may repeat 1 tablet in 2 hours, no more than 2 tablets in 24 hours 8 tablet 11   valsartan-hydrochlorothiazide (DIOVAN-HCT) 160-12.5 MG tablet Take 1 tablet by mouth daily. 90 tablet 3   Vibegron (GEMTESA) 75 MG TABS Take 1 tablet (75 mg total) by mouth daily. 30 tablet 2   Vibegron (GEMTESA) 75 MG TABS Take 1 tablet (75 mg total) by mouth daily. 28 tablet 0   No current facility-administered medications for this visit.     ALLERGIES: Morphine and codeine, Sulfa antibiotics, and Topamax [topiramate]  Family History  Problem Relation Age of Onset   Lung cancer Mother        never smoker   Heart disease Father    Dementia Father     Ulcerative colitis Brother    Heart failure Maternal Grandmother    ALS Maternal Grandfather    Melanoma Paternal Grandmother    Brain cancer Paternal Grandmother    Stroke Paternal Grandfather    Migraines Neg Hx    Colon cancer Neg Hx     Social History   Socioeconomic History   Marital status: Married    Spouse name: Brett Canales   Number of children: 3   Years of education: Master's   Highest education level: Not on file  Occupational History   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   Right-handed   Caffeine: 1-3 cups of coffee per day   Social Drivers of Health   Financial Resource Strain: Low Risk  (07/14/2023)   Received from [facility]   Overall Financial Resource Strain (CARDIA)    Difficulty of Paying Living Expenses: Not hard at all  Food Insecurity: No Food Insecurity (07/14/2023)   Received from [facility]   Hunger Vital Sign    Worried About Running Out of Food in the Last Year: Never true    Ran Out of Food in the Last Year: Never true  Transportation Needs: No Transportation Needs (07/14/2023)   Received from [facility]    In the past 12 months, has lack of transportation kept you from medical appointments or from getting medications?: No    Lack of Transportation (Non-Medical): No  Physical Activity: Not on file  Stress: Not on file  Social  Connections: Not on file  Intimate Partner Violence: Not on file    Review of Systems  PHYSICAL EXAMINATION:   LMP 04/29/2001 (LMP Unknown)     General appearance:  alert, cooperative and appears stated age Head: Normocephalic, without obvious abnormality, atraumatic Neck: no adenopathy, supple, symmetrical, trachea midline and thyroid normal to inspection and palpation Lungs: clear to auscultation bilaterally Breasts: normal appearance, no masses or tenderness, No nipple retraction or dimpling, No nipple discharge or bleeding, No axillary or supraclavicular adenopathy Heart: regular rate and rhythm Abdomen: soft, non-tender, no masses,  no organomegaly Extremities: extremities normal, atraumatic, no cyanosis or edema Skin: Skin color, texture, turgor normal. No rashes or lesions Lymph nodes: Cervical, supraclavicular, and axillary nodes normal. No abnormal inguinal nodes palpated Neurologic: Grossly normal  Pelvic: External genitalia:  no lesions              Urethra:  normal appearing urethra with no masses, tenderness or lesions              Bartholins and Skenes: normal                 Vagina: normal appearing vagina with normal color and discharge, no lesions              Cervix: no lesions                Bimanual Exam:  Uterus:  normal size, contour, position, consistency, mobility, non-tender              Adnexa: no mass, fullness, tenderness              Rectal exam: {yes no:314532}.  Confirms.              Anus:  normal sphincter tone, no lesions  Chaperone was present for exam:  {BSCHAPERONE:31226::"Mykiah Schmuck F, CMA"}  ASSESSMENT:    PLAN:    {LABS (Optional):23779}  ***  total time was spent for this patient encounter, including preparation, face-to-face counseling with the patient, coordination of care, and documentation of the encounter.

## 2023-08-12 ENCOUNTER — Ambulatory Visit: Payer: Medicare Other | Admitting: Obstetrics and Gynecology

## 2023-09-02 ENCOUNTER — Encounter: Payer: Self-pay | Admitting: Obstetrics and Gynecology

## 2023-09-02 ENCOUNTER — Ambulatory Visit: Admitting: Obstetrics

## 2023-09-02 ENCOUNTER — Ambulatory Visit (INDEPENDENT_AMBULATORY_CARE_PROVIDER_SITE_OTHER): Admitting: Obstetrics and Gynecology

## 2023-09-02 VITALS — BP 126/78 | HR 80

## 2023-09-02 DIAGNOSIS — Z5181 Encounter for therapeutic drug level monitoring: Secondary | ICD-10-CM

## 2023-09-02 DIAGNOSIS — R6882 Decreased libido: Secondary | ICD-10-CM | POA: Diagnosis not present

## 2023-09-02 NOTE — Progress Notes (Signed)
 GYNECOLOGY  VISIT   HPI: 68 y.o.   Married  Caucasian female   [phone] with Patient's last menstrual period was 04/29/2001 (lmp unknown).   here for: 7 week follow up   - Testerone cream. Patient states she has been doing good with the cream.  Increased libido.  She is ok on her current dosage.   Caring for her father who has dementia.  He is in Hospice.   GYNECOLOGIC HISTORY: Patient's last menstrual period was 04/29/2001 (lmp unknown). Contraception:  Hysterectomy Menopausal hormone therapy:  Testosterone   Last 2 paps:  2012 neg History of abnormal Pap or positive HPV:  yes Mammogram:  05/28/23 Breast Density Cat B, BIRADS Cat 1 neg        OB History     Gravida  3   Para  3   Term  3   Preterm      AB      Living  3      SAB      IAB      Ectopic      Multiple      Live Births  3              Patient Active Problem List   Diagnosis Date Noted   Vaginal atrophy 05/05/2023   Incontinence of feces 05/05/2023   Mixed stress and urge urinary incontinence 05/05/2023   Pelvic pain 05/05/2023   Pelvic organ prolapse quantification stage 2 cystocele 05/05/2023   Female hypogonadism syndrome 01/13/2019   Vitamin D deficiency disease 01/13/2019   Classical migraine with intractable migraine 11/30/2015   Upper airway cough syndrome 04/07/2013   Essential hypertension, benign 04/07/2013   Extrinsic asthma 03/05/2013   Animal bite 03/18/2011    Past Medical History:  Diagnosis Date   Abnormal Pap smear of cervix 2006   Repeat Pap normal   Allergy    Animal bite 03/18/2011   Dog Bite   Arthritis    Asthma    Classical migraine with intractable migraine 11/30/2015   Connective tissue disease, undifferentiated (HCC) 2015   Dr. Ebbie Goldmann   Female hypogonadism syndrome 01/13/2019   GERD (gastroesophageal reflux disease)    Hyperlipidemia    Hypertension    Migraine    Vitamin D deficiency disease 01/13/2019   Wears glasses     Past Surgical History:   Procedure Laterality Date   BACK SURGERY  2009   lumb lam-fusion   Basil cell     BREAST REDUCTION SURGERY  05/26/2012   Procedure: MAMMARY REDUCTION  (BREAST);  Surgeon: Bonnielee Buttery, MD;  Location: [facility];  Service: Plastics;  Laterality: Bilateral;   COLONOSCOPY  2003, 2018   diverticulosis   VAGINAL HYSTERECTOMY  2003   hyst    Current Outpatient Medications  Medication Sig Dispense Refill   Cholecalciferol (VITAMIN D-3) 125 MCG (5000 UT) TABS Take 10,000 Units by mouth daily.     estradiol  (ESTRACE ) 0.1 MG/GM vaginal cream Place 0.5 g vaginally 2 (two) times a week. Place 0.5g nightly for two weeks then twice a week after 30 g 3   estradiol  (ESTRACE ) 0.5 MG tablet Take 1 tablet (0.5 mg total) by mouth daily. 90 tablet 6   hydroxychloroquine  (PLAQUENIL ) 200 MG tablet Take 200 mg by mouth daily.     NONFORMULARY OR COMPOUNDED ITEM Testosterone  cream 0.1%.  Apply 0.5 grams daily to skin of arm, leg, or abdomen and rotate site.  Disp:  60 grams, RF:  none.  [facility]. 60 each 0   pregabalin  (LYRICA ) 75 MG capsule Take 1 capsule (75 mg total) by mouth 2 (two) times daily 180 capsule 0   rosuvastatin  (CRESTOR ) 20 MG tablet Take 1 tablet (20 mg total) by mouth daily. 90 tablet 3   traZODone  (DESYREL ) 150 MG tablet Take 1 tablet (150 mg total) by mouth at bedtime. 90 tablet 3   Ubrogepant  (UBRELVY ) 100 MG TABS Take 1 tablet (100 mg total) by mouth daily as needed. Take one tablet at onset of headache, may repeat 1 tablet in 2 hours, no more than 2 tablets in 24 hours 8 tablet 11   valsartan -hydrochlorothiazide  (DIOVAN -HCT) 160-12.5 MG tablet Take 1 tablet by mouth daily. 90 tablet 3   Vibegron  (GEMTESA ) 75 MG TABS Take 1 tablet (75 mg total) by mouth daily. 30 tablet 2   No current facility-administered medications for this visit.     ALLERGIES: Morphine and codeine , Sulfa antibiotics, and Topamax  [topiramate ]  Family History  Problem Relation Age of  Onset   Lung cancer Mother        never smoker   Heart disease Father    Dementia Father    Ulcerative colitis Brother    Heart failure Maternal Grandmother    ALS Maternal Grandfather    Melanoma Paternal Grandmother    Brain cancer Paternal Grandmother    Stroke Paternal Grandfather    Migraines Neg Hx    Colon cancer Neg Hx     Social History   Socioeconomic History   Marital status: Married    Spouse name: Siegfried Dress   Number of children: 3   Years of education: Master's   Highest education level: Not on file  Occupational History   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   Right-handed   Caffeine: 1-3 cups of coffee per day   Social Drivers of Health   Financial Resource Strain: Low Risk  (07/14/2023)   Received from [facility]   Overall Financial Resource Strain (CARDIA)    Difficulty of Paying Living Expenses: Not hard at all  Food Insecurity: No Food Insecurity (07/14/2023)   Received from [facility]   Hunger Vital Sign    Worried About Running Out of Food in the Last Year: Never true    Ran Out of Food in the Last Year: Never true  Transportation Needs: No Transportation Needs (07/14/2023)   Received from [facility] - Transportation    In the past 12 months, has lack of transportation kept you from medical appointments or from getting medications?: No    Lack of  Transportation (Non-Medical): No  Physical Activity: Not on file  Stress: Not on file  Social Connections: Not on file  Intimate Partner Violence: Not on file  Review of Systems  PHYSICAL EXAMINATION:   BP 126/78 (BP Location: Left Arm, Patient Position: Sitting)   Pulse 80   LMP 04/29/2001 (LMP Unknown)   SpO2 96%     General appearance: alert, cooperative and appears stated age   ASSESSMENT:  Decreased libido.  Encounter for medication monitoring.  Father is in Hospice care.   PLAN:  Will check testosterone  levels.  [facility] will contact me for refills. FU for yearly visit and prn.   15 min  total time was spent for this patient encounter, including preparation, face-to-face counseling with the patient, coordination of care, and documentation of the encounter.

## 2023-09-06 LAB — TESTOS,TOTAL,FREE AND SHBG (FEMALE)
Free Testosterone: 3.7 pg/mL (ref 0.1–6.4)
Sex Hormone Binding: 66 nmol/L (ref 14–73)
Testosterone, Total, LC-MS-MS: 30 ng/dL (ref 2–45)

## 2023-09-07 ENCOUNTER — Encounter: Payer: Self-pay | Admitting: Obstetrics and Gynecology

## 2023-09-15 ENCOUNTER — Other Ambulatory Visit (HOSPITAL_COMMUNITY): Payer: Self-pay

## 2023-09-16 ENCOUNTER — Other Ambulatory Visit: Payer: Self-pay

## 2023-09-16 ENCOUNTER — Other Ambulatory Visit (HOSPITAL_COMMUNITY): Payer: Self-pay

## 2023-09-16 MED ORDER — ROSUVASTATIN CALCIUM 20 MG PO TABS
20.0000 mg | ORAL_TABLET | Freq: Every day | ORAL | 3 refills | Status: DC
  Filled 2023-09-16 – 2023-09-23 (×2): qty 90, 90d supply, fill #0
  Filled 2023-12-18: qty 90, 90d supply, fill #1

## 2023-09-18 ENCOUNTER — Ambulatory Visit: Admitting: Obstetrics

## 2023-09-19 ENCOUNTER — Other Ambulatory Visit: Payer: Self-pay

## 2023-09-23 ENCOUNTER — Other Ambulatory Visit (HOSPITAL_COMMUNITY): Payer: Self-pay

## 2023-09-23 ENCOUNTER — Other Ambulatory Visit: Payer: Self-pay

## 2023-09-25 ENCOUNTER — Telehealth: Admitting: Obstetrics

## 2023-09-25 ENCOUNTER — Encounter (HOSPITAL_COMMUNITY): Payer: Self-pay

## 2023-09-25 ENCOUNTER — Other Ambulatory Visit (HOSPITAL_COMMUNITY): Payer: Self-pay

## 2023-09-25 DIAGNOSIS — N3946 Mixed incontinence: Secondary | ICD-10-CM | POA: Diagnosis not present

## 2023-09-25 MED ORDER — MIRABEGRON ER 25 MG PO TB24
25.0000 mg | ORAL_TABLET | Freq: Every day | ORAL | 0 refills | Status: AC
  Filled 2023-09-25: qty 30, 30d supply, fill #0

## 2023-09-25 NOTE — Assessment & Plan Note (Addendum)
-   POCT UA negative 05/15/23 - urgency > stress, SUI since college - We discussed the symptoms of overactive bladder (OAB), which include urinary urgency, urinary frequency, nocturia, with or without urge incontinence.  While we do not know the exact etiology of OAB, several treatment options exist. We discussed management including behavioral therapy (decreasing bladder irritants, urge suppression strategies, timed voids, bladder retraining), physical therapy, medication; for refractory cases posterior tibial nerve stimulation, sacral neuromodulation, and intravesical botulinum toxin injection.  For anticholinergic medications, we discussed the potential side effects of anticholinergics including dry eyes, dry mouth, constipation, cognitive impairment and urinary retention. For Beta-3 agonist medication, we discussed the potential side effect of elevated blood pressure which is more likely to occur in individuals with uncontrolled hypertension. - Gemtesa  with relief, cost prohibitive - encouraged weight loss due to association with onset of urinary symptoms - consider return to pelvic floor PT if refractory symptoms - encouraged Kegel exercises, encouraged fluid management, bladder training and caffeine reduction - Rx mirabegron 25mg  daily. Start at 25mg  daily for 1 month, if blood pressure remains unchanged, increase to 50mg  after 1 month and continue to monitor blood pressure. - if mirabegron is cost prohibitive, discussed Rx trospium. [facility] if cost prohibitive - switch Diovan -HCT to 2-3pm to reassess daytime urinary frequency

## 2023-09-25 NOTE — Progress Notes (Addendum)
 [facility] Return Visit  Patient location: [place], [place]  Provider location: [facility]  Patient consents to video call and understands the limitations of telehealth visit.  SUBJECTIVE  History of Present Illness: Victoria Cooper is a 68 y.o. female seen in follow-up for mixed urinary incontinence, fecal incontinence, stage II pelvic organ prolapse, vaginal estrogen, and pelvic pain. Plan at last visit was trial of Gemtesa , consider return to pelvic floor PT, resume Kegel exercises, fluid management, Kegel exercises, caffeine reduction, vaginal estrogen, and consider pessary trial.   Started Gemtesa  with relief, cost prohibitive. Desires to continue medication use Leaks 5x/days with urgency resolved with Gemtesa , 1x/month with activity Day time voids 10 attributed to morning dosing of blood pressure medication.  Nocturia: 1-2 times per night to void resolved since Gemtesa  and caffeine reduction Drinks: 64-80oz water per day due to dry mouth, leaks less with decreases fluid intake. Drinks 2 cups of coffee/day reduced to 1 cup/day Trying intermittent fasting to lose weight  Past Medical History: Patient  has a past medical history of Abnormal Pap smear of cervix (2006), Allergy, Animal bite (03/18/2011), Arthritis, Asthma, Classical migraine with intractable migraine (11/30/2015), Connective tissue disease, undifferentiated (HCC) (2015), Female hypogonadism syndrome (01/13/2019), GERD (gastroesophageal reflux disease), Hyperlipidemia, Hypertension, Migraine, Vitamin D deficiency disease (01/13/2019), and Wears glasses.   Past Surgical History: She  has a past surgical history that includes Back surgery (2009); Vaginal hysterectomy (2003); Breast reduction surgery (05/26/2012); Basil cell; and Colonoscopy (2003, 2018).   Medications: She has a current medication list which includes the following prescription(s): mirabegron er, vitamin d-3,  estradiol , estradiol , hydroxychloroquine , NONFORMULARY OR COMPOUNDED ITEM, pregabalin , rosuvastatin , rosuvastatin , trazodone , ubrelvy , and valsartan -hydrochlorothiazide .   Allergies: Patient is allergic to morphine and codeine , sulfa antibiotics, and topamax  [topiramate ].   Social History: Patient  reports that she has never smoked. She has never used smokeless tobacco. She reports current alcohol use. She reports that she does not use drugs.     OBJECTIVE     Physical Exam: Gen: No apparent distress, A&O x 3.    ASSESSMENT AND PLAN    Ms. Stuck is a 68 y.o. with:  1. Mixed stress and urge urinary incontinence     Mixed stress and urge urinary incontinence Assessment & Plan: - POCT UA negative 05/15/23 - urgency > stress, SUI since college - We discussed the symptoms of overactive bladder (OAB), which include urinary urgency, urinary frequency, nocturia, with or without urge incontinence.  While we do not know the exact etiology of OAB, several treatment options exist. We discussed management including behavioral therapy (decreasing bladder irritants, urge suppression strategies, timed voids, bladder retraining), physical therapy, medication; for refractory cases posterior tibial nerve stimulation, sacral neuromodulation, and intravesical botulinum toxin injection.  For anticholinergic medications, we discussed the potential side effects of anticholinergics including dry eyes, dry mouth, constipation, cognitive impairment and urinary retention. For Beta-3 agonist medication, we discussed the potential side effect of elevated blood pressure which is more likely to occur in individuals with uncontrolled hypertension. - Gemtesa  with relief, cost prohibitive - encouraged weight loss due to association with onset of urinary symptoms - consider return to pelvic floor PT if refractory symptoms - encouraged Kegel exercises, encouraged fluid management, bladder training and caffeine  reduction - Rx mirabegron 25mg  daily. Start at 25mg  daily for 1 month, if blood pressure remains unchanged, increase to 50mg  after 1 month and continue to monitor blood pressure. - if mirabegron is cost prohibitive, discussed Rx  trospium. [facility] if cost prohibitive - switch Diovan -HCT to 2-3pm to reassess daytime urinary frequency  Orders: -     Mirabegron  ER; Take 1 tablet (25 mg total) by mouth daily.  Dispense: 30 tablet; Refill: 0    Time spent: I spent 27 minutes dedicated to the care of this patient on the date of this encounter to include pre-visit review of records, face-to-face time with the patient discussing mixed urinary incontinence and post visit documentation and ordering medication/ testing.    Darlene Ehlers, MD

## 2023-09-25 NOTE — Patient Instructions (Addendum)
  For Beta-3 agonist medication,there is a potential side effect of elevated blood pressure which is more likely to occur in individuals with uncontrolled hypertension. It appears that your most recent blood pressure is within normal limits 120s/80s. Please monitor your blood pressure and stop the medication if you experience any headache, chest discomfort, or shortness of breath and seek care immediately.  I have sent your prescription of mirabegron  to your pharmacy. Start at 25mg  daily for 1 month, if your blood pressure remains unchanged, increase to 50mg  after 1 month and continue to monitor your blood pressure. You can send your blood pressure recordings via MyChart for review.  If Mirabegron  is cost prohibitive, please call for prescription of Trospium For anticholinergic medications, we discussed the potential side effects of anticholinergics including dry eyes, dry mouth, constipation, rare risks of cognitive impairment and urinary retention.   If Trospium is too costly from your pharmacy.  Please visit the website below to sign up for an account. We will need an email address to send along with your prescription to verify your prescription once you have signed up.  [email or web address]  Trospium Chloride ER Capsule Extended Release  60mg   30 count $31.11

## 2023-10-23 ENCOUNTER — Other Ambulatory Visit (HOSPITAL_COMMUNITY): Payer: Self-pay

## 2023-10-23 MED ORDER — EZETIMIBE 10 MG PO TABS
10.0000 mg | ORAL_TABLET | Freq: Every day | ORAL | 3 refills | Status: AC
  Filled 2023-10-23: qty 90, 90d supply, fill #0
  Filled 2024-01-29: qty 90, 90d supply, fill #1
  Filled 2024-05-03: qty 90, 90d supply, fill #2

## 2023-10-27 ENCOUNTER — Other Ambulatory Visit (HOSPITAL_COMMUNITY): Payer: Self-pay

## 2023-11-06 ENCOUNTER — Other Ambulatory Visit (HOSPITAL_COMMUNITY): Payer: Self-pay

## 2023-11-06 ENCOUNTER — Other Ambulatory Visit: Payer: Self-pay

## 2023-11-06 MED ORDER — VALSARTAN-HYDROCHLOROTHIAZIDE 160-12.5 MG PO TABS
1.0000 | ORAL_TABLET | Freq: Every day | ORAL | 3 refills | Status: AC
  Filled 2023-11-06: qty 90, 90d supply, fill #0
  Filled 2024-01-29: qty 90, 90d supply, fill #1
  Filled 2024-05-03: qty 90, 90d supply, fill #2

## 2023-11-07 ENCOUNTER — Other Ambulatory Visit: Payer: Self-pay

## 2023-11-13 NOTE — Progress Notes (Signed)
 PATIENT: Victoria Cooper DOB: Sep 10, 1955  REASON FOR VISIT: follow up HISTORY FROM: patient  Virtual Visit via MyChart video  I connected with Victoria Cooper on 11/18/23 at  9:45 AM EDT via MyChart video and verified that I am speaking with the correct person using two identifiers.   I discussed the limitations, risks, security and privacy concerns of performing an evaluation and management service by Mychart video and the availability of in person appointments. I also discussed with the patient that there may be a patient responsible charge related to this service. The patient expressed understanding and agreed to proceed.   History of Present Illness:  11/18/23 ALL (MyChart): Victoria Cooper is a 68 y.o. female here today for follow up for migraines and hx of CVA. She was last seen 10/2022 and doing well on Nurtec PRN. Since, she reports doing very well. She has had very few headaches. No unusual visual changes or complex migraine symptoms. She has taken Nurtec once since last visit.   She continues close follow up with PCP. Now on rosuvastatin  and Zetia . She is taking estrogen replacement. She is aware of risks associated with increased risk of stroke in women with migraine aura. Unclear etiology of previous stroke. She feels benefits out weight risks at this time but discusses with PCP often.   She was recently seen by Dr Allyn with [facility] for lumbar stenosis. She continues follow up with psychology (pain management), yoga and massage therapy. Pain is well managed.   11/14/22 ALL:  Victoria Cooper is a 68 y.o. female here today for follow up for migraines. She was last seen by Dr Rush 01/2022 following Dr Jenel' retirement. She had an episode of right sided weakness and vertigo associated with migraine. MRI showed no acute proess but did show old bilateral cerebral infarcts also present in 2017 imaging. CTA ordered to complete stroke workup. 50% narrowing in left  ECA. No significant carotid stenosis. Asa 81mg  and Crestor  20mg  continued per PCP. Nurtec started for concerns of hemiplegic migraine. She was also reporting worsening low back pain s/p lumbar fusion. MRI showed severe spinal stenosis at L3-L4. She was referred to NS for repeat ESI and or consideration of surgery.   Since, she reports doing fairly well. She has had about 4 migraines over the past 6 months. Nurtec works very well. She continues to have left leg numbness. She was seen by Dr Debby, NS. He recommended surgery. Felt ESI would not be helpful. She was referred to PT. She completed 11 weeks of PT. She was not comfortable with considering surgery and Dr Clarice referred her to [facility]. She was seen recently and planning to to have a type of injection next week.   HISTORY (copied from Dr Merna previous note)  Medical co-morbidities: HLD, GERD   The patient presents for evaluation of headaches and weakness.  On 01/23/22 she was in a shop when she suddenly felt disoriented, then felt like her right leg was being pulled away from her. Right arm felt weak as well. This lasted for 3-4 minutes. She then developed a severe headache with photophobia, nausea, and vertigo. No phonophobia, numbness, or visual aura. She presented to the ED where MRI brain showed no acute process but did show bilateral remote cerebellar infarcts (present on MRI in 2017 as well). She was unaware that she had a stroke in the past. Headache lasted 2-3 days and resolved on its own. She does note increased stress since August  as she has been the primary caregiver for her father.    She has a history of migraine with aura since college. She is prescribed Maxalt but rarely takes it. Usually takes Excedrin which helps take the edge off. Only has one migraine every 6 months. Has lower level headaches ~2 days per week.   She previously followed with neurology in 2017 for migraine with visual and sensory aura. She was prescribed Topamax  for  migraine prevention, but this caused side effects so she stopped it.   She also reports pain in her lower back which radiates down her right leg. Sometimes feels like it will give out from under her. Has intermittent numbness in this leg as well. She has a history of lumbar stenosis s/p L4-S1 fusion. Feels symptoms have gotten worse since her surgery. She tried SI injections and physical therapy last year, but symptoms have persisted.   Headache History: Onset: college Triggers: weather changes Aura: visual aura, numbness Quality/Description: throbbing Associated Symptoms:             Photophobia: yes             Phonophobia: no             Nausea: yes Other symptoms: vertigo Worse with activity?: yes Duration of headaches: several hours   Migraine days per month: 1 Headache free days per month: 29   Current Treatment: Abortive Maxalt Excedrin   Preventative none   Prior Therapies                                 Topamax  75 mg QHS valsartan  Maxalt 10 mg PRN   Observations/Objective:  Generalized: Well developed, in no acute distress  Mentation: Alert oriented to time, place, history taking. Follows all commands speech and language fluent   Assessment and Plan:  68 y.o. year old female  has a past medical history of Abnormal Pap smear of cervix (2006), Allergy, Animal bite (03/18/2011), Arthritis, Asthma, Classical migraine with intractable migraine (11/30/2015), Connective tissue disease, undifferentiated (HCC) (2015), Female hypogonadism syndrome (01/13/2019), GERD (gastroesophageal reflux disease), Hyperlipidemia, Hypertension, Migraine, Vitamin D deficiency disease (01/13/2019), and Wears glasses. here with    ICD-10-CM   1. Cerebrovascular accident (CVA), unspecified mechanism (HCC)  I63.9     2. Migraine with aura and without status migrainosus, not intractable  G43.109     3. Lumbar radiculopathy  M54.16      Victoria Cooper is doing well from a migraine standpoint. We  will continue Nurtec as needed. Will send prescription when needed. We have discussed risks associated with estrogen use in women with migraine aura as well as previous history of stroke. She is aware of risks and feels benefits out weigh risks at this time. She is followed closely by PCP and continues regular conversation regarding estrogen use. She continues rosuvastatin  and Zetia  for cholesterol management. Healthy lifestyle habits encouraged. She will follow up with me in 1 year, sooner if needed.    No orders of the defined types were placed in this encounter.   No orders of the defined types were placed in this encounter.    Follow Up Instructions:  I discussed the assessment and treatment plan with the patient. The patient was provided an opportunity to ask questions and all were answered. The patient agreed with the plan and demonstrated an understanding of the instructions.   The patient was advised to call back or seek  an in-person evaluation if the symptoms worsen or if the condition fails to improve as anticipated.  I provided 30 minutes of face-to-face and non face-to-face time during this MyChart video encounter. Patient located at their place of residence. Provider is in the office.    Gerritt Galentine, NP

## 2023-11-13 NOTE — Patient Instructions (Addendum)
 Below is our plan:  We will continue Nurtec as needed. I recommend against the use of estrogen in women with a history of migraine with aura due to an increased risk for stroke. Keep talking with Dr Clarice regarding risks versus benefit.   Please make sure you are staying well hydrated. I recommend 50-60 ounces daily. Well balanced diet and regular exercise encouraged. Consistent sleep schedule with 6-8 hours recommended.   Please continue follow up with care team as directed.   Follow up with me in 1 year   You may receive a survey regarding today's visit. I encourage you to leave honest feed back as I do use this information to improve patient care. Thank you for seeing me today!   GENERAL HEADACHE INFORMATION:   Natural supplements: Magnesium Oxide or Magnesium Glycinate 500 mg at bed (up to 800 mg daily) Coenzyme Q10 300 mg in AM Vitamin B2- 200 mg twice a day   Add 1 supplement at a time since even natural supplements can have undesirable side effects. You can sometimes buy supplements cheaper (especially Coenzyme Q10) at www.WebmailGuide.co.za or at [facility].  Migraine with aura: There is increased risk for stroke in women with migraine with aura and a contraindication for the combined contraceptive pill for use by women who have migraine with aura. The risk for women with migraine without aura is lower. However other risk factors like smoking are far more likely to increase stroke risk than migraine. There is a recommendation for no smoking and for the use of OCPs without estrogen such as progestogen only pills particularly for women with migraine with aura.SABRA People who have migraine headaches with auras may be 3 times more likely to have a stroke caused by a blood clot, compared to migraine patients who don't see auras. Women who take hormone-replacement therapy may be 30 percent more likely to suffer a clot-based stroke than women not taking medication containing estrogen. Other risk factors like  smoking and high blood pressure may be  much more important.    Vitamins and herbs that show potential:   Magnesium: Magnesium (250 mg twice a day or 500 mg at bed) has a relaxant effect on smooth muscles such as blood vessels. Individuals suffering from frequent or daily headache usually have low magnesium levels which can be increase with daily supplementation of 400-750 mg. Three trials found 40-90% average headache reduction  when used as a preventative. Magnesium may help with headaches are aura, the best evidence for magnesium is for migraine with aura is its thought to stop the cortical spreading depression we believe is the pathophysiology of migraine aura.Magnesium also demonstrated the benefit in menstrually related migraine.  Magnesium is part of the messenger system in the serotonin cascade and it is a good muscle relaxant.  It is also useful for constipation which can be a side effect of other medications used to treat migraine. Good sources include nuts, whole grains, and tomatoes. Side Effects: loose stool/diarrhea  Riboflavin (vitamin B 2) 200 mg twice a day. This vitamin assists nerve cells in the production of ATP a principal energy storing molecule.  It is necessary for many chemical reactions in the body.  There have been at least 3 clinical trials of riboflavin using 400 mg per day all of which suggested that migraine frequency can be decreased.  All 3 trials showed significant improvement in over half of migraine sufferers.  The supplement is found in bread, cereal, milk, meat, and poultry.  Most Americans  get more riboflavin than the recommended daily allowance, however riboflavin deficiency is not necessary for the supplements to help prevent headache. Side effects: energizing, green urine   Coenzyme Q10: This is present in almost all cells in the body and is critical component for the conversion of energy.  Recent studies have shown that a nutritional supplement of CoQ10 can reduce  the frequency of migraine attacks by improving the energy production of cells as with riboflavin.  Doses of 150 mg twice a day have been shown to be effective.   Melatonin: Increasing evidence shows correlation between melatonin secretion and headache conditions.  Melatonin supplementation has decreased headache intensity and duration.  It is widely used as a sleep aid.  Sleep is natures way of dealing with migraine.  A dose of 3 mg is recommended to start for headaches including cluster headache. Higher doses up to 15 mg has been reviewed for use in Cluster headache and have been used. The rationale behind using melatonin for cluster is that many theories regarding the cause of Cluster headache center around the disruption of the normal circadian rhythm in the brain.  This helps restore the normal circadian rhythm.   HEADACHE DIET: Foods and beverages which may trigger migraine Note that only 20% of headache patients are food sensitive. You will know if you are food sensitive if you get a headache consistently 20 minutes to 2 hours after eating a certain food. Only cut out a food if it causes headaches, otherwise you might remove foods you enjoy! What matters most for diet is to eat a well balanced healthy diet full of vegetables and low fat protein, and to not miss meals.   Chocolate, other sweets ALL cheeses except cottage and cream cheese Dairy products, yogurt, sour cream, ice cream Liver Meat extracts (Bovril, Marmite, meat tenderizers) Meats or fish which have undergone aging, fermenting, pickling or smoking. These include: Hotdogs,salami,Lox,sausage, mortadellas,smoked salmon, pepperoni, Pickled herring Pods of broad bean (English beans, Chinese pea pods, [place] (fava) beans, lima and navy beans Ripe avocado, ripe banana Yeast extracts or active yeast preparations such as Brewer's or Fleishman's (commercial bakes goods are permitted) Tomato based foods, pizza (lasagna, etc.)   MSG  (monosodium glutamate) is disguised as many things; look for these common aliases: Monopotassium glutamate Autolysed yeast Hydrolysed protein Sodium caseinate "flavorings" "all natural preservatives Nutrasweet   Avoid all other foods that convincingly provoke headaches.   Resources: The Dizzy Bluford Aid Your Headache Diet, migrainestrong.com  https://zamora-andrews.com/   Caffeine and Migraine For patients that have migraine, caffeine intake more than 3 days per week can lead to dependency and increased migraine frequency. I would recommend cutting back on your caffeine intake as best you can. The recommended amount of caffeine is 200-300 mg daily, although migraine patients may experience dependency at even lower doses. While you may notice an increase in headache temporarily, cutting back will be helpful for headaches in the long run. For more information on caffeine and migraine, visit: https://americanmigrainefoundation.org/resource-library/caffeine-and-migraine/   Headache Prevention Strategies:   1. Maintain a headache diary; learn to identify and avoid triggers.  - This can be a simple note where you log when you had a headache, associated symptoms, and medications used - There are several smartphone apps developed to help track migraines: Migraine Buddy, Migraine Monitor, Curelator N1-Headache App   Common triggers include: Emotional triggers: Emotional/Upset family or friends Emotional/Upset occupation Business reversal/success Anticipation anxiety Crisis-serious Post-crisis periodNew job/position   Physical triggers: Vacation Day Weekend Strenuous  Exercise High Altitude Location New Move Menstrual Day Physical Illness Oversleep/Not enough sleep Weather changes Light: Photophobia or light sesnitivity treatment involves a balance between desensitization and reduction in overly strong input. Use dark polarized glasses  outside, but not inside. Avoid bright or fluorescent light, but do not dim environment to the point that going into a normally lit room hurts. Consider FL-41 tint lenses, which reduce the most irritating wavelengths without blocking too much light.  These can be obtained at axonoptics.com or theraspecs.com Foods: see list above.   2. Limit use of acute treatments (over-the-counter medications, triptans, etc.) to no more than 2 days per week or 10 days per month to prevent medication overuse headache (rebound headache).     3. Follow a regular schedule (including weekends and holidays): Don't skip meals. Eat a balanced diet. 8 hours of sleep nightly. Minimize stress. Exercise 30 minutes per day. Being overweight is associated with a 5 times increased risk of chronic migraine. Keep well hydrated and drink 6-8 glasses of water per day.   4. Initiate non-pharmacologic measures at the earliest onset of your headache. Rest and quiet environment. Relax and reduce stress. Breathe2Relax is a free app that can instruct you on    some simple relaxtion and breathing techniques. Http://Dawnbuse.com is a    free website that provides teaching videos on relaxation.  Also, there are  many apps that   can be downloaded for "mindful" relaxation.  An app called YOGA NIDRA will help walk you through mindfulness. Another app called Calm can be downloaded to give you a structured mindfulness guide with daily reminders and skill development. Headspace for guided meditation Mindfulness Based Stress Reduction Online Course: www.palousemindfulness.com Cold compresses.   5. Don't wait!! Take the maximum allowable dosage of prescribed medication at the first sign of migraine.   6. Compliance:  Take prescribed medication regularly as directed and at the first sign of a migraine.   7. Communicate:  Call your physician when problems arise, especially if your headaches change, increase in frequency/severity, or become  associated with neurological symptoms (weakness, numbness, slurred speech, etc.). Proceed to emergency room if you experience new or worsening symptoms or symptoms do not resolve, if you have new neurologic symptoms or if headache is severe, or for any concerning symptom.   8. Headache/pain management therapies: Consider various complementary methods, including medication, behavioral therapy, psychological counselling, biofeedback, massage therapy, acupuncture, dry needling, and other modalities.  Such measures may reduce the need for medications. Counseling for pain management, where patients learn to function and ignore/minimize their pain, seems to work very well.   9. Recommend changing family's attention and focus away from patient's headaches. Instead, emphasize daily activities. If first question of day is 'How are your headaches/Do you have a headache today?', then patient will constantly think about headaches, thus making them worse. Goal is to re-direct attention away from headaches, toward daily activities and other distractions.   10. Helpful Websites: www.AmericanHeadacheSociety.org PatentHood.ch www.headaches.org TightMarket.nl www.achenet.org

## 2023-11-18 ENCOUNTER — Encounter: Payer: Self-pay | Admitting: Family Medicine

## 2023-11-18 ENCOUNTER — Telehealth: Payer: Medicare HMO | Admitting: Family Medicine

## 2023-11-18 DIAGNOSIS — M5416 Radiculopathy, lumbar region: Secondary | ICD-10-CM | POA: Diagnosis not present

## 2023-11-18 DIAGNOSIS — I639 Cerebral infarction, unspecified: Secondary | ICD-10-CM | POA: Diagnosis not present

## 2023-11-18 DIAGNOSIS — G43109 Migraine with aura, not intractable, without status migrainosus: Secondary | ICD-10-CM | POA: Diagnosis not present

## 2023-12-15 ENCOUNTER — Ambulatory Visit: Attending: Cardiology | Admitting: Cardiology

## 2023-12-15 ENCOUNTER — Encounter: Payer: Self-pay | Admitting: Cardiology

## 2023-12-15 VITALS — BP 124/82 | HR 85 | Ht 62.0 in | Wt 167.8 lb

## 2023-12-15 DIAGNOSIS — I10 Essential (primary) hypertension: Secondary | ICD-10-CM | POA: Diagnosis present

## 2023-12-15 DIAGNOSIS — E782 Mixed hyperlipidemia: Secondary | ICD-10-CM | POA: Diagnosis present

## 2023-12-15 DIAGNOSIS — I340 Nonrheumatic mitral (valve) insufficiency: Secondary | ICD-10-CM | POA: Diagnosis present

## 2023-12-15 DIAGNOSIS — R002 Palpitations: Secondary | ICD-10-CM | POA: Diagnosis not present

## 2023-12-15 NOTE — Progress Notes (Signed)
    Cardiology Office Note  Date: 12/15/2023   ID: Chynah, Orihuela [birth year], MRN [id number]  History of Present Illness: Victoria Cooper is a 68 y.o. female last seen by Ms. Miriam NP in May 2024, I reviewed her note.  Our last visit was in 2023.  She is here for a routine visit.  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  She is exercising as tolerated, has had some trouble with her back and knees however.  No exertional chest pain or unusual shortness of breath.  No palpitations or syncope.  We went over her medications.  No specific change noted.  Her most recent lipid panel looked quite good with HDL 74 and LDL 66.  I reviewed her ECG today which shows normal sinus rhythm.  Physical Exam: VS:  BP 124/82 (BP Location: Right Arm)   Pulse 85   Ht 5' 2 (1.575 m)   Wt 167 lb 12.8 oz (76.1 kg)   LMP 04/29/2001 (LMP Unknown)   SpO2 95%   BMI 30.69 kg/m , BMI Body mass index is 30.69 kg/m.  Wt Readings from Last 3 Encounters:  12/15/23 167 lb 12.8 oz (76.1 kg)  06/24/23 166 lb (75.3 kg)  05/05/23 166 lb (75.3 kg)    General: Patient appears comfortable at rest. HEENT: Conjunctiva and lids normal. Neck: Supple, no elevated JVP. Lungs: Clear to auscultation, nonlabored breathing at rest. Cardiac: Regular rate and rhythm, no S3, 2/6 systolic murmur. Extremities: No pitting edema.  ECG:  An ECG dated 01/23/2022 was personally reviewed today and demonstrated:  Sinus rhythm.  Labwork:  June 2024: Hemoglobin 15.2, platelets 176, BUN 11, creatinine 0.78, potassium 3.7, GFR 84, AST 23, ALT 21 December 2023: Cholesterol 150, triglycerides 43, HDL 74, LDL 66  Other Studies Reviewed Today:  Echocardiogram 11/11/2022:  1. Left ventricular ejection fraction, by estimation, is 60 to 65%. The  left ventricle has normal function. The left ventricle has no regional  wall motion abnormalities.  Left ventricular diastolic parameters are  consistent with Grade I diastolic  dysfunction (impaired relaxation).   2. Right ventricular systolic function is low normal. The right  ventricular size is normal. There is normal pulmonary artery systolic  pressure.   3. The mitral valve is abnormal. Mild mitral valve regurgitation. No  evidence of mitral stenosis.   4. The aortic valve is tricuspid. There is mild calcification of the  aortic valve. Aortic valve regurgitation is not visualized. No aortic  stenosis is present.   5. The inferior vena cava is normal in size with greater than 50%  respiratory variability, suggesting right atrial pressure of 3 mmHg.   Assessment and Plan:  1.  Palpitations.  Quiescent at this time.  Continue observation.  ECG reviewed.  2.  Mitral regurgitation, mild by most recent echocardiogram in July, LVEF 60 to 65%.  Asymptomatic, no significant change in cardiac murmur.  3.  Mixed hyperlipidemia.  Coronary calcium  score 31.7 in 2022.  Recent lipid panel shows HDL 74 and LDL 66.  Currently on Crestor  20 mg daily and Zetia  10 mg daily.  4.  Primary hypertension.  No change in current regimen including Diovan  HCT 160/12.5 mg daily.  Disposition:  Follow up 1 year.  Signed, Jayson JUDITHANN Sierras, M.D., F.A.C.C. [facility] at [facility]

## 2023-12-15 NOTE — Patient Instructions (Signed)
Medication Instructions:  Your physician recommends that you continue on your current medications as directed. Please refer to the Current Medication list given to you today.   Labwork: None today  Testing/Procedures: None today  Follow-Up: 1 year Dr.McDowell  Any Other Special Instructions Will Be Listed Below (If Applicable).  If you need a refill on your cardiac medications before your next appointment, please call your pharmacy.

## 2023-12-16 ENCOUNTER — Encounter: Payer: Self-pay | Admitting: Obstetrics and Gynecology

## 2023-12-16 ENCOUNTER — Telehealth: Payer: Self-pay | Admitting: Obstetrics and Gynecology

## 2023-12-16 NOTE — Telephone Encounter (Signed)
 Please contact patient regarding an office note I received from cardiology for 12/15/23 visit.  This caused me to review her chart further.   I see her history of CVA, cerebrovascular accident and have added this to her medical history in her Epic electronic record.   I do recommend that she discontinue her use of oral estrogen and the testosterone  cream, as I am concerned that these will increase her risk of future stroke.    She may wish to review her oral estrogen prescription with her PCP, who is currently prescribing this.   The vaginal estrogen cream she is using is a low dose and should not increase the risk of blood clots.   There are other options for her to treat menopausal symptoms if needed.

## 2023-12-17 NOTE — Telephone Encounter (Signed)
 Call placed to patient, left detailed message, ok per dpr. Advised per Dr. Nikki. Return call to office to advise how she plans to proceed, [phone], opt 4.

## 2023-12-18 ENCOUNTER — Other Ambulatory Visit: Payer: Self-pay

## 2023-12-30 ENCOUNTER — Ambulatory Visit: Admitting: Obstetrics

## 2023-12-31 NOTE — Telephone Encounter (Signed)
 Left message to call back.

## 2024-01-08 NOTE — Telephone Encounter (Signed)
 MyChart message sent to patient.

## 2024-01-08 NOTE — Telephone Encounter (Signed)
 No response from patient, detailed message left on 12/17/23.   Routing to Dr. Nikki

## 2024-01-19 ENCOUNTER — Other Ambulatory Visit (HOSPITAL_COMMUNITY): Payer: Self-pay

## 2024-01-19 MED ORDER — PREDNISONE 10 MG (21) PO TBPK
ORAL_TABLET | ORAL | 0 refills | Status: DC
  Filled 2024-01-19: qty 21, 6d supply, fill #0

## 2024-01-29 ENCOUNTER — Other Ambulatory Visit: Payer: Self-pay

## 2024-01-29 ENCOUNTER — Other Ambulatory Visit (HOSPITAL_COMMUNITY): Payer: Self-pay

## 2024-01-29 MED ORDER — TRAZODONE HCL 150 MG PO TABS
150.0000 mg | ORAL_TABLET | Freq: Every day | ORAL | 3 refills | Status: AC
  Filled 2024-01-29: qty 90, 90d supply, fill #0
  Filled 2024-05-03: qty 90, 90d supply, fill #1

## 2024-01-29 MED ORDER — HYDROXYCHLOROQUINE SULFATE 200 MG PO TABS
400.0000 mg | ORAL_TABLET | Freq: Every day | ORAL | 0 refills | Status: DC
  Filled 2024-01-29: qty 60, 30d supply, fill #0

## 2024-01-30 ENCOUNTER — Other Ambulatory Visit: Payer: Self-pay

## 2024-02-09 ENCOUNTER — Other Ambulatory Visit (HOSPITAL_COMMUNITY): Payer: Self-pay

## 2024-02-09 ENCOUNTER — Other Ambulatory Visit: Payer: Self-pay

## 2024-02-09 MED ORDER — DICLOFENAC SODIUM 75 MG PO TBEC
DELAYED_RELEASE_TABLET | ORAL | 2 refills | Status: DC
  Filled 2024-02-09: qty 60, 30d supply, fill #0
  Filled 2024-04-11: qty 60, 30d supply, fill #1

## 2024-02-20 ENCOUNTER — Telehealth (HOSPITAL_BASED_OUTPATIENT_CLINIC_OR_DEPARTMENT_OTHER): Payer: Self-pay

## 2024-02-20 NOTE — Telephone Encounter (Signed)
   Pre-operative Risk Assessment    Patient Name: Victoria Cooper  DOB: 1955-07-19 MRN: [id number]   Date of last office visit: 12/15/2023 with Jayson Sierras, MD Date of next office visit: N/A   Request for Surgical Clearance    Procedure:  Left Total Knee Arthroplasty   Date of Surgery:  Clearance TBD                                 Surgeon:  Dr. Donnice Car Surgeon's Group or Practice Name:  [facility]  Phone number:  [phone] Fax number:  [phone]   Type of Clearance Requested:   - Medical    Type of Anesthesia:  Spinal   Additional requests/questions:  [facility]: All cases will have a hospital PAT appointment w/labs per anesthesia protocol 10-14 days prior to surgery. We will manage HgbA1C. Order additional labs as needed (labs valid for 3 mo).   SignedPatrcia Hong L   02/20/2024, 2:35 PM

## 2024-02-20 NOTE — Telephone Encounter (Signed)
 Left message call back to schedule tele preop appt.

## 2024-02-20 NOTE — Telephone Encounter (Signed)
   Name: Victoria Cooper  DOB: 31-Aug-1955  MRN: [id number]  Primary Cardiologist: Jayson Sierras, MD   Preoperative team, please contact this patient and set up a phone call appointment for further preoperative risk assessment. Please obtain consent and complete medication review. Thank you for your help.  I confirm that guidance regarding antiplatelet and oral anticoagulation therapy has been completed and, if necessary, noted below.  Patient is not on anticoagulation or antiplatelet per review of current medical record in Epic.    I also confirmed the patient resides in the state of [place] . As per [facility] Medical Board telemedicine laws, the patient must reside in the state in which the provider is licensed.   Lamarr Satterfield, NP 02/20/2024, 2:51 PM [facility]

## 2024-02-23 ENCOUNTER — Telehealth (HOSPITAL_BASED_OUTPATIENT_CLINIC_OR_DEPARTMENT_OTHER): Payer: Self-pay | Admitting: *Deleted

## 2024-02-23 NOTE — Telephone Encounter (Signed)
 Pt has been scheduled tele preop 04/05/24. Pt tells me surgery is planned foe 05/13/24. Med rec and consent are done.   We did discuss that f her surgery is moved up sooner to please call ASAP to let us  know as we would need to most likely need to change her tele appt. Pt agreed.

## 2024-02-23 NOTE — Telephone Encounter (Signed)
 Pt has been scheduled tele preop 04/05/24. Pt tells me surgery is planned foe 05/13/24. Med rec and consent are done.   We did discuss that f her surgery is moved up sooner to please call ASAP to let us  know as we would need to most likely need to change her tele appt. Pt agreed.     Patient Consent for Virtual Visit        Victoria Cooper has provided verbal consent on 02/23/2024 for a virtual visit (video or telephone).   CONSENT FOR VIRTUAL VISIT FOR:  Victoria Cooper  By participating in this virtual visit I agree to the following:  I hereby voluntarily request, consent and authorize [facility] and its employed or contracted physicians, physician assistants, nurse practitioners or other licensed health care professionals (the Practitioner), to provide me with telemedicine health care services (the "Services) as deemed necessary by the treating Practitioner. I acknowledge and consent to receive the Services by the Practitioner via telemedicine. I understand that the telemedicine visit will involve communicating with the Practitioner through live audiovisual communication technology and the disclosure of certain medical information by electronic transmission. I acknowledge that I have been given the opportunity to request an in-person assessment or other available alternative prior to the telemedicine visit and am voluntarily participating in the telemedicine visit.  I understand that I have the right to withhold or withdraw my consent to the use of telemedicine in the course of my care at any time, without affecting my right to future care or treatment, and that the Practitioner or I may terminate the telemedicine visit at any time. I understand that I have the right to inspect all information obtained and/or recorded in the course of the telemedicine visit and may receive copies of available information for a reasonable fee.  I understand that some of the potential risks of  receiving the Services via telemedicine include:  Delay or interruption in medical evaluation due to technological equipment failure or disruption; Information transmitted may not be sufficient (e.g. poor resolution of images) to allow for appropriate medical decision making by the Practitioner; and/or  In rare instances, security protocols could fail, causing a breach of personal health information.  Furthermore, I acknowledge that it is my responsibility to provide information about my medical history, conditions and care that is complete and accurate to the best of my ability. I acknowledge that Practitioner's advice, recommendations, and/or decision may be based on factors not within their control, such as incomplete or inaccurate data provided by me or distortions of diagnostic images or specimens that may result from electronic transmissions. I understand that the practice of medicine is not an exact science and that Practitioner makes no warranties or guarantees regarding treatment outcomes. I acknowledge that a copy of this consent can be made available to me via my patient portal [facility] MyChart), or I can request a printed copy by calling the office of  Heart[facility].    I understand that my insurance will be billed for this visit.   I have read or had this consent read to me. I understand the contents of this consent, which adequately explains the benefits and risks of the Services being provided via telemedicine.  I have been provided ample opportunity to ask questions regarding this consent and the Services and have had my questions answered to my satisfaction. I give my informed consent for the services to be provided through the use of telemedicine in my medical care

## 2024-03-08 ENCOUNTER — Other Ambulatory Visit (HOSPITAL_COMMUNITY): Payer: Self-pay

## 2024-03-08 ENCOUNTER — Other Ambulatory Visit: Payer: Self-pay

## 2024-03-08 MED ORDER — HYDROXYCHLOROQUINE SULFATE 200 MG PO TABS
200.0000 mg | ORAL_TABLET | Freq: Every day | ORAL | 1 refills | Status: AC
  Filled 2024-03-08: qty 90, 90d supply, fill #0
  Filled 2024-06-02: qty 90, 90d supply, fill #1

## 2024-03-08 MED ORDER — CELECOXIB 100 MG PO CAPS
100.0000 mg | ORAL_CAPSULE | Freq: Two times a day (BID) | ORAL | 1 refills | Status: AC | PRN
  Filled 2024-03-08: qty 60, 30d supply, fill #0
  Filled 2024-05-03: qty 60, 30d supply, fill #1

## 2024-03-10 ENCOUNTER — Other Ambulatory Visit (HOSPITAL_COMMUNITY): Payer: Self-pay

## 2024-03-10 MED ORDER — TIZANIDINE HCL 4 MG PO TABS
4.0000 mg | ORAL_TABLET | Freq: Two times a day (BID) | ORAL | 0 refills | Status: DC | PRN
  Filled 2024-03-10: qty 60, 30d supply, fill #0

## 2024-04-05 ENCOUNTER — Ambulatory Visit: Attending: Cardiology | Admitting: Nurse Practitioner

## 2024-04-05 DIAGNOSIS — Z01810 Encounter for preprocedural cardiovascular examination: Secondary | ICD-10-CM

## 2024-04-05 NOTE — Progress Notes (Signed)
 Virtual Visit via Telephone Note   Because of Victoria Cooper co-morbid illnesses, she is at least at moderate risk for complications without adequate follow up.  This format is felt to be most appropriate for this patient at this time.  Due to technical limitations with video connection (technology), today's appointment will be conducted as an audio only telehealth visit, and Victoria Cooper verbally agreed to proceed in this manner.   All issues noted in this document were discussed and addressed.  No physical exam could be performed with this format.  Evaluation Performed:  Preoperative cardiovascular risk assessment _____________   Date:  04/05/2024   Patient ID:  Victoria Cooper, Victoria Cooper [birth year], MRN [id number] Patient Location:  Home Provider location:   Office  Primary Care Provider:  Clarice Nottingham, MD Primary Cardiologist:  Jayson Sierras, MD  Chief Complaint / Patient Profile   68 y.o. y/o female with a h/o HLD, palpitations, mitral regurgitation, HTN who is pending left total knee arthroplasty with Dr. Ernie on 05/13/24 and presents today for telephonic preoperative cardiovascular risk assessment.  History of Present Illness    Victoria Cooper is a 68 y.o. female who presents via audio/video conferencing for a telehealth visit today.  Pt was last seen in cardiology clinic on 12/15/23 by Dr. Sierras.  At that time Victoria Cooper was doing well. The patient is now pending procedure as outlined above. Since her last visit, she denies chest pain, shortness of breath, lower extremity edema, fatigue, palpitations, melena, hematuria, presyncope, syncope, orthopnea, and PND. She is somewhat limited by knee pain following injury that occurred in September but remains able to achieve > 4 METS activity without concerning cardiac symptoms. The history of CVA followed a very significant migraine. She was evaluated by neurology and was advised that symptoms/changes felt to be  secondary to migraine. She was not advised to take blood thinner.    Past Medical History    Past Medical History:  Diagnosis Date   Abnormal Pap smear of cervix 2006   Repeat Pap normal   Allergy    Animal bite 03/18/2011   Dog Bite   Arthritis    Asthma    Classical migraine with intractable migraine 11/30/2015   Connective tissue disease, undifferentiated 2015   Dr. Mai   CVA (cerebral vascular accident) [facility])    Female hypogonadism syndrome 01/13/2019   GERD (gastroesophageal reflux disease)    Hyperlipidemia    Hypertension    Migraine    Vitamin D deficiency disease 01/13/2019   Wears glasses    Past Surgical History:  Procedure Laterality Date   BACK SURGERY  2009   lumb lam-fusion   Basil cell     BREAST REDUCTION SURGERY  05/26/2012   Procedure: MAMMARY REDUCTION  (BREAST);  Surgeon: Kayla Pray, MD;  Location: [facility];  Service: Plastics;  Laterality: Bilateral;   COLONOSCOPY  2003, 2018   diverticulosis   VAGINAL HYSTERECTOMY  2003   hyst    Allergies  Allergies  Allergen Reactions   Morphine And Codeine  Itching   Sulfa Antibiotics Itching and Other (See Comments)    Itching in palms of hand and feet   Topamax  [Topiramate ]     Unknown to patient    Home Medications    Prior to Admission medications   Medication Sig Start Date End Date Taking? Authorizing Provider  celecoxib  (CELEBREX ) 100 MG capsule Take 1 capsule (100 mg total) by mouth 2 (two) times daily  as needed. 03/08/24     Cholecalciferol (VITAMIN D-3) 125 MCG (5000 UT) TABS Take 10,000 Units by mouth daily.    [provider]  diclofenac  (VOLTAREN ) 75 MG EC tablet Take 1 tablet twice a day by oral route with meal(s). 02/09/24     estradiol  (ESTRACE ) 0.1 MG/GM vaginal cream Place 0.5 g vaginally 2 (two) times a week. Place 0.5g nightly for two weeks then twice a week after 05/08/23   Guadlupe Dull T, MD  estradiol  (ESTRACE ) 0.5 MG tablet Take 1 tablet (0.5 mg  total) by mouth daily. 03/03/23     ezetimibe  (ZETIA ) 10 MG tablet Take 1 tablet (10 mg total) by mouth daily. 10/23/23     hydroxychloroquine  (PLAQUENIL ) 200 MG tablet Take 200 mg by mouth daily.    [provider]  hydroxychloroquine  (PLAQUENIL ) 200 MG tablet Take 1 tablet (200 mg total) by mouth daily. 03/08/24     mirabegron  ER (MYRBETRIQ ) 25 MG TB24 tablet Take 1 tablet (25 mg total) by mouth daily. Patient not taking: Reported on 12/15/2023 09/25/23   Guadlupe Dull DASEN, MD  NONFORMULARY OR COMPOUNDED ITEM Testosterone  cream 0.1%.  Apply 0.5 grams daily to skin of arm, leg, or abdomen and rotate site.  Disp:  60 grams, RF:  none.  [facility]. 06/24/23   Cathlyn JAYSON Cary, Bobie BRAVO, MD  predniSONE  (STERAPRED UNI-PAK 21 TAB) 10 MG (21) TBPK tablet Take as directed on package with meals 01/19/24     pregabalin  (LYRICA ) 75 MG capsule Take 1 capsule (75 mg total) by mouth 2 (two) times daily Patient not taking: Reported on 12/15/2023 12/05/22     rosuvastatin  (CRESTOR ) 20 MG tablet Take 1 tablet (20 mg total) by mouth daily. 03/03/23     tiZANidine  (ZANAFLEX ) 4 MG tablet Take 1 tablet (4 mg total) by mouth 2 (two) times daily as needed for up to 30 days 03/10/24     traZODone  (DESYREL ) 150 MG tablet Take 1 tablet (150 mg total) by mouth at bedtime. 01/29/24     valsartan -hydrochlorothiazide  (DIOVAN -HCT) 160-12.5 MG tablet Take 1 tablet by mouth daily. 11/06/23       Physical Exam    Vital Signs:  Victoria Cooper does not have vital signs available for review today.  Given telephonic nature of communication, physical exam is limited. AAOx3. NAD. Normal affect.  Speech and respirations are unlabored.  Accessory Clinical Findings    None  Assessment & Plan    1.  Preoperative Cardiovascular Risk Assessment: According to the Revised Cardiac Risk Index (RCRI), her Perioperative Risk of Major Cardiac Event is (%): 0.9 Her Functional Capacity in METs is: 6.61 according to the [facility] Activity  Status Index (DASI). The patient is doing well from a cardiac perspective. Therefore, based on ACC/AHA guidelines, the patient would be at acceptable risk for the planned procedure without further cardiovascular testing.    The patient was advised that if she develops new symptoms prior to surgery to contact our office to arrange for a follow-up visit, and she verbalized understanding.  No request to hold cardiac medications.  A copy of this note will be routed to requesting surgeon.  Time:   Today, I have spent 10 minutes with the patient with telehealth technology discussing medical history, symptoms, and management plan.     Victoria EMERSON Bane, NP-C  04/05/2024, 9:00 AM [address], [address] [place], [place] [address] Office [phone] Fax [phone]

## 2024-04-11 ENCOUNTER — Other Ambulatory Visit (HOSPITAL_COMMUNITY): Payer: Self-pay

## 2024-04-12 ENCOUNTER — Other Ambulatory Visit: Payer: Self-pay

## 2024-04-12 ENCOUNTER — Other Ambulatory Visit (HOSPITAL_COMMUNITY): Payer: Self-pay

## 2024-04-12 MED ORDER — ROSUVASTATIN CALCIUM 20 MG PO TABS
20.0000 mg | ORAL_TABLET | Freq: Every day | ORAL | 3 refills | Status: AC
  Filled 2024-04-12: qty 90, 90d supply, fill #0

## 2024-04-13 ENCOUNTER — Other Ambulatory Visit: Payer: Self-pay

## 2024-04-13 ENCOUNTER — Other Ambulatory Visit (HOSPITAL_COMMUNITY): Payer: Self-pay

## 2024-04-13 MED ORDER — GABAPENTIN 300 MG PO CAPS
300.0000 mg | ORAL_CAPSULE | Freq: Every evening | ORAL | 0 refills | Status: AC
  Filled 2024-04-13: qty 90, 90d supply, fill #0

## 2024-04-19 ENCOUNTER — Other Ambulatory Visit (HOSPITAL_COMMUNITY): Payer: Self-pay

## 2024-04-19 MED ORDER — CYCLOBENZAPRINE HCL 5 MG PO TABS
5.0000 mg | ORAL_TABLET | Freq: Every evening | ORAL | 0 refills | Status: DC | PRN
  Filled 2024-04-19: qty 30, 15d supply, fill #0

## 2024-05-03 ENCOUNTER — Other Ambulatory Visit (HOSPITAL_COMMUNITY): Payer: Self-pay

## 2024-05-03 NOTE — Progress Notes (Addendum)
 Date of COVID positive in last 90 days:  No  PCP - Ryan Hives, MD Cardiologist - Jayson Sierras, MD Neurologist - Greig Forbes, NP  Cardiac clearance in Epic dated 04-05-24  Chest x-ray - N/A EKG - 12-15-23 Epic Stress Test - N/A ECHO - 11-11-22 Epic Cardiac CT - 10-09-20 Epic Cardiac Cath - N/A Pacemaker/ICD device last checked:N/A Spinal Cord Stimulator:N/A  Bowel Prep - N/A  Sleep Study - N/A CPAP -   Fasting Blood Sugar - N/A Checks Blood Sugar _____ times a day  Last dose of GLP1 agonist-  N/A GLP1 instructions:  Do not take after     Last dose of SGLT-2 inhibitors-  N/A SGLT-2 instructions:  Do not take after    Blood Thinner Instructions: N/A Last dose:   Time: Aspirin  Instructions  ASA 81, per patient to stop 5 days before  Last Dose:  Activity level:   Can go up a flight of stairs and perform activities of daily living without stopping and without symptoms of chest pain or shortness of breath.  Some limitations due to bilateral knee pain.   Anesthesia review: Hx of palpitations( patient states palpitations are rare now),  mitral regurg, HTN, hx of CVA with no deficits  Patient denies shortness of breath, fever, cough and chest pain at PAT appointment  Patient verbalized understanding of instructions that were given to them at the PAT appointment. Patient was also instructed that they will need to review over the PAT instructions again at home before surgery.

## 2024-05-03 NOTE — Patient Instructions (Addendum)
 SURGICAL WAITING ROOM VISITATION Patients having surgery or a procedure may have no more than 2 support people in the waiting area - these visitors may rotate.    Children under the age of 38 will not be allowed to visit due to the increase in respiratory illness  Children under the age of 35 must have an adult with them who is not the patient.  If the patient needs to stay at the hospital during part of their recovery, the visitor guidelines for inpatient rooms apply. Pre-op nurse will coordinate an appropriate time for 1 support person to accompany patient in pre-op.  This support person may not rotate.    Please refer to the [facility] website for the visitor guidelines for Inpatients (after your surgery is over and you are in a regular room).       Your procedure is scheduled on: 05-13-24   Report to [facility] Main Entrance    Report to admitting at 6:00 AM   Call this number if you have problems the morning of surgery [phone]   Do not eat food :After Midnight.   After Midnight you may have the following liquids until 5:30 AM DAY OF SURGERY  Water Non-Citrus Juices (without pulp, NO RED-Apple, White grape, White cranberry) Black Coffee (NO MILK/CREAM OR CREAMERS, sugar ok)  Clear Tea (NO MILK/CREAM OR CREAMERS, sugar ok) regular and decaf                             Plain Jell-O (NO RED)                                           Fruit ices (not with fruit pulp, NO RED)                                     Popsicles (NO RED)                                                               Sports drinks like Gatorade (NO RED)                   The day of surgery:  Drink ONE (1) Pre-Surgery Clear Ensure  by 5:30 AM the morning of surgery. Drink in one sitting. Do not sip.  This drink was given to you during your hospital  pre-op appointment visit. Nothing else to drink after completing the Pre-Surgery Clear Ensure.          If you have questions, please contact  your surgeons office.   FOLLOW  ANY ADDITIONAL PRE OP INSTRUCTIONS YOU RECEIVED FROM YOUR SURGEON'S OFFICE!!!     Oral Hygiene is also important to reduce your risk of infection.                                    Remember - BRUSH YOUR TEETH THE MORNING OF SURGERY WITH YOUR REGULAR TOOTHPASTE   Do NOT smoke after Midnight  Take these medicines the morning of surgery with A SIP OF WATER:    Ezetimibe    Rosuvastatin    If needed Nurtec   Hold Aspirin  5 days before surgery  Stop all vitamins and herbal supplements 7 days before surgery                              You may not have any metal on your body including hair pins, jewelry, and body piercing             Do not wear make-up, lotions, powders, perfumes, or deodorant  Do not wear nail polish including gel and S&S, artificial/acrylic nails, or any other type of covering on natural nails including finger and toenails. If you have artificial nails, gel coating, etc. that needs to be removed by a nail salon please have this removed prior to surgery or surgery may need to be canceled/ delayed if the surgeon/ anesthesia feels like they are unable to be safely monitored.   Do not shave  48 hours prior to surgery.      Do not bring valuables to the hospital. [facility] IS NOT RESPONSIBLE   FOR VALUABLES.   Contacts, dentures or bridgework may not be worn into surgery.  DO NOT BRING YOUR HOME MEDICATIONS TO THE HOSPITAL. PHARMACY WILL DISPENSE MEDICATIONS LISTED ON YOUR MEDICATION LIST TO YOU DURING YOUR ADMISSION IN THE HOSPITAL!    Patients discharged on the day of surgery will not be allowed to drive home.  Someone NEEDS to stay with you for the first 24 hours after anesthesia.   Special Instructions: Bring a copy of your healthcare power of attorney and living will documents the day of surgery if you haven't scanned them before.              Please read over the following fact sheets you were given: IF YOU HAVE QUESTIONS ABOUT  YOUR PRE-OP INSTRUCTIONS PLEASE CALL [phone] G[facility]  If you received a COVID test during your pre-op visit  it is requested that you wear a mask when out in public, stay away from anyone that may not be feeling well and notify your surgeon if you develop symptoms. If you test positive for Covid or have been in contact with anyone that has tested positive in the last 10 days please notify you surgeon.   Pre-operative 4 CHG Bath Instructions  DYNA-Hex 4 Chlorhexidine  Gluconate 4% Solution Antiseptic 4 fl. oz   You can play a key role in reducing the risk of infection after surgery. Your skin needs to be as free of germs as possible. You can reduce the number of germs on your skin by washing with CHG (chlorhexidine  gluconate) soap before surgery. CHG is an antiseptic soap that kills germs and continues to kill germs even after washing.   DO NOT use if you have an allergy to chlorhexidine /CHG or antibacterial soaps. If your skin becomes reddened or irritated, stop using the CHG and notify one of our RNs at   Please shower with the CHG soap starting 4 days before surgery using the following schedule:     Please keep in mind the following:  DO NOT shave, including legs and underarms, starting the day of your first shower.   You may shave your face at any point before/day of surgery.  Place clean sheets on your bed the day you start using CHG soap. Use a clean washcloth (not used  since being washed) for each shower. DO NOT sleep with pets once you start using the CHG.  CHG Shower Instructions:  If you choose to wash your hair and private area, wash first with your normal shampoo/soap.  After you use shampoo/soap, rinse your hair and body thoroughly to remove shampoo/soap residue.  Turn the water OFF and apply about 3 tablespoons (45 ml) of CHG soap to a CLEAN washcloth.  Apply CHG soap ONLY FROM YOUR NECK DOWN TO YOUR TOES (washing for 3-5 minutes)  DO NOT use CHG soap on face, private areas,  open wounds, or sores.  Pay special attention to the area where your surgery is being performed.  If you are having back surgery, having someone wash your back for you may be helpful. Wait 2 minutes after CHG soap is applied, then you may rinse off the CHG soap.  Pat dry with a clean towel  Put on clean clothes/pajamas   If you choose to wear lotion, please use ONLY the CHG-compatible lotions on the back of this paper.     Additional instructions for the day of surgery:  Shower with regular soap the day of surgery DO NOT APPLY any lotions, deodorants, cologne, or perfumes.   Put on clean/comfortable clothes.  Brush your teeth.  Ask your nurse before applying any prescription medications to the skin.   CHG Compatible Lotions   Aveeno Moisturizing lotion  Cetaphil Moisturizing Cream  Cetaphil Moisturizing Lotion  Clairol Herbal Essence Moisturizing Lotion, Dry Skin  Clairol Herbal Essence Moisturizing Lotion, Extra Dry Skin  Clairol Herbal Essence Moisturizing Lotion, Normal Skin  Curel Age Defying Therapeutic Moisturizing Lotion with Alpha Hydroxy  Curel Extreme Care Body Lotion  Curel Soothing Hands Moisturizing Hand Lotion  Curel Therapeutic Moisturizing Cream, Fragrance-Free  Curel Therapeutic Moisturizing Lotion, Fragrance-Free  Curel Therapeutic Moisturizing Lotion, Original Formula  Eucerin Daily Replenishing Lotion  Eucerin Dry Skin Therapy Plus Alpha Hydroxy Crme  Eucerin Dry Skin Therapy Plus Alpha Hydroxy Lotion  Eucerin Original Crme  Eucerin Original Lotion  Eucerin Plus Crme Eucerin Plus Lotion  Eucerin TriLipid Replenishing Lotion  Keri Anti-Bacterial Hand Lotion  Keri Deep Conditioning Original Lotion Dry Skin Formula Softly Scented  Keri Deep Conditioning Original Lotion, Fragrance Free Sensitive Skin Formula  Keri Lotion Fast Absorbing Fragrance Free Sensitive Skin Formula  Keri Lotion Fast Absorbing Softly Scented Dry Skin Formula  Keri Original Lotion   Keri Skin Renewal Lotion Keri Silky Smooth Lotion  Keri Silky Smooth Sensitive Skin Lotion  Nivea Body Creamy Conditioning Oil  Nivea Body Extra Enriched Lotion  Nivea Body Original Lotion  Nivea Body Sheer Moisturizing Lotion Nivea Crme  Nivea Skin Firming Lotion  NutraDerm 30 Skin Lotion  NutraDerm Skin Lotion  NutraDerm Therapeutic Skin Cream  NutraDerm Therapeutic Skin Lotion  ProShield Protective Hand Cream  Provon moisturizing lotion   PATIENT SIGNATURE_________________________________  NURSE SIGNATURE__________________________________  ________________________________________________________________________    Victoria Cooper  An incentive spirometer is a tool that can help keep your lungs clear and active. This tool measures how well you are filling your lungs with each breath. Taking long deep breaths may help reverse or decrease the chance of developing breathing (pulmonary) problems (especially infection) following: A long period of time when you are unable to move or be active. BEFORE THE PROCEDURE  If the spirometer includes an indicator to show your best effort, your nurse or respiratory therapist will set it to a desired goal. If possible, sit up straight or lean slightly forward. Try not  to slouch. Hold the incentive spirometer in an upright position. INSTRUCTIONS FOR USE  Sit on the edge of your bed if possible, or sit up as far as you can in bed or on a chair. Hold the incentive spirometer in an upright position. Breathe out normally. Place the mouthpiece in your mouth and seal your lips tightly around it. Breathe in slowly and as deeply as possible, raising the piston or the ball toward the top of the column. Hold your breath for 3-5 seconds or for as long as possible. Allow the piston or ball to fall to the bottom of the column. Remove the mouthpiece from your mouth and breathe out normally. Rest for a few seconds and repeat Steps 1 through 7 at least  10 times every 1-2 hours when you are awake. Take your time and take a few normal breaths between deep breaths. The spirometer may include an indicator to show your best effort. Use the indicator as a goal to work toward during each repetition. After each set of 10 deep breaths, practice coughing to be sure your lungs are clear. If you have an incision (the cut made at the time of surgery), support your incision when coughing by placing a pillow or rolled up towels firmly against it. Once you are able to get out of bed, walk around indoors and cough well. You may stop using the incentive spirometer when instructed by your caregiver.  RISKS AND COMPLICATIONS Take your time so you do not get dizzy or light-headed. If you are in pain, you may need to take or ask for pain medication before doing incentive spirometry. It is harder to take a deep breath if you are having pain. AFTER USE Rest and breathe slowly and easily. It can be helpful to keep track of a log of your progress. Your caregiver can provide you with a simple table to help with this. If you are using the spirometer at home, follow these instructions: SEEK MEDICAL CARE IF:  You are having difficultly using the spirometer. You have trouble using the spirometer as often as instructed. Your pain medication is not giving enough relief while using the spirometer. You develop fever of 100.5 F (38.1 C) or higher. SEEK IMMEDIATE MEDICAL CARE IF:  You cough up bloody sputum that had not been present before. You develop fever of 102 F (38.9 C) or greater. You develop worsening pain at or near the incision site. MAKE SURE YOU:  Understand these instructions. Will watch your condition. Will get help right away if you are not doing well or get worse. Document Released: 08/26/2006 Document Revised: 07/08/2011 Document Reviewed: 10/27/2006 [facility] Patient Information 2014 [place],  [place].  _______________________________________________________________________

## 2024-05-04 ENCOUNTER — Other Ambulatory Visit (HOSPITAL_COMMUNITY): Payer: Self-pay

## 2024-05-04 ENCOUNTER — Other Ambulatory Visit: Payer: Self-pay

## 2024-05-04 MED ORDER — ESTRADIOL 0.5 MG PO TABS
0.5000 mg | ORAL_TABLET | Freq: Every day | ORAL | 6 refills | Status: AC
  Filled 2024-05-04: qty 90, 90d supply, fill #0

## 2024-05-06 ENCOUNTER — Encounter (HOSPITAL_COMMUNITY)
Admission: RE | Admit: 2024-05-06 | Discharge: 2024-05-06 | Disposition: A | Discharge status: Home / Self Care | Source: Ambulatory Visit | Attending: Orthopedic Surgery | Admitting: Orthopedic Surgery

## 2024-05-06 ENCOUNTER — Encounter (HOSPITAL_COMMUNITY): Payer: Self-pay

## 2024-05-06 ENCOUNTER — Other Ambulatory Visit: Payer: Self-pay

## 2024-05-06 VITALS — BP 150/86 | HR 79 | Temp 98.5°F | Resp 12 | Ht 62.0 in | Wt 165.2 lb

## 2024-05-06 DIAGNOSIS — K219 Gastro-esophageal reflux disease without esophagitis: Secondary | ICD-10-CM | POA: Diagnosis not present

## 2024-05-06 DIAGNOSIS — I340 Nonrheumatic mitral (valve) insufficiency: Secondary | ICD-10-CM | POA: Insufficient documentation

## 2024-05-06 DIAGNOSIS — J45909 Unspecified asthma, uncomplicated: Secondary | ICD-10-CM | POA: Diagnosis not present

## 2024-05-06 DIAGNOSIS — Z981 Arthrodesis status: Secondary | ICD-10-CM | POA: Insufficient documentation

## 2024-05-06 DIAGNOSIS — Z01818 Encounter for other preprocedural examination: Secondary | ICD-10-CM

## 2024-05-06 DIAGNOSIS — E871 Hypo-osmolality and hyponatremia: Secondary | ICD-10-CM | POA: Diagnosis not present

## 2024-05-06 DIAGNOSIS — Z79899 Other long term (current) drug therapy: Secondary | ICD-10-CM | POA: Insufficient documentation

## 2024-05-06 DIAGNOSIS — Z01812 Encounter for preprocedural laboratory examination: Secondary | ICD-10-CM | POA: Diagnosis present

## 2024-05-06 DIAGNOSIS — E785 Hyperlipidemia, unspecified: Secondary | ICD-10-CM | POA: Diagnosis not present

## 2024-05-06 DIAGNOSIS — M359 Systemic involvement of connective tissue, unspecified: Secondary | ICD-10-CM | POA: Diagnosis not present

## 2024-05-06 DIAGNOSIS — I10 Essential (primary) hypertension: Secondary | ICD-10-CM | POA: Insufficient documentation

## 2024-05-06 DIAGNOSIS — R002 Palpitations: Secondary | ICD-10-CM | POA: Insufficient documentation

## 2024-05-06 HISTORY — DX: Other complications of anesthesia, initial encounter: T88.59XA

## 2024-05-06 HISTORY — DX: Unspecified malignant neoplasm of skin, unspecified: C44.90

## 2024-05-06 LAB — CBC
HCT: 42.8 % (ref 36.0–46.0)
Hemoglobin: 14.6 g/dL (ref 12.0–15.0)
MCH: 32.2 pg (ref 26.0–34.0)
MCHC: 34.1 g/dL (ref 30.0–36.0)
MCV: 94.5 fL (ref 80.0–100.0)
Platelets: 235 K/uL (ref 150–400)
RBC: 4.53 MIL/uL (ref 3.87–5.11)
RDW: 12.5 % (ref 11.5–15.5)
WBC: 7.5 K/uL (ref 4.0–10.5)
nRBC: 0 % (ref 0.0–0.2)

## 2024-05-06 LAB — SURGICAL PCR SCREEN
MRSA, PCR: NEGATIVE
Staphylococcus aureus: POSITIVE — AB

## 2024-05-06 LAB — BASIC METABOLIC PANEL WITH GFR
Anion gap: 10 (ref 5–15)
BUN: 12 mg/dL (ref 8–23)
CO2: 26 mmol/L (ref 22–32)
Calcium: 9.4 mg/dL (ref 8.9–10.3)
Chloride: 98 mmol/L (ref 98–111)
Creatinine, Ser: 0.65 mg/dL (ref 0.44–1.00)
GFR, Estimated: >60 mL/min
Glucose, Bld: 95 mg/dL (ref 70–99)
Potassium: 3.9 mmol/L (ref 3.5–5.1)
Sodium: 134 mmol/L — ABNORMAL LOW (ref 135–145)

## 2024-05-06 NOTE — Progress Notes (Signed)
PCR results sent to Dr. Olin to review.   

## 2024-05-07 NOTE — Anesthesia Preprocedure Evaluation (Signed)
 "                                  Anesthesia Evaluation  Patient identified by MRN, date of birth, ID band Patient awake    Reviewed: Allergy & Precautions, NPO status , Patient's Chart, lab work & pertinent test results  Airway Mallampati: II  TM Distance: >3 FB Neck ROM: Full    Dental  (+) Teeth Intact, Dental Advisory Given   Pulmonary asthma    breath sounds clear to auscultation       Cardiovascular hypertension, Pt. on medications  Rhythm:Regular Rate:Normal     Neuro/Psych  Headaches CVA  negative psych ROS   GI/Hepatic Neg liver ROS,GERD  ,,  Endo/Other  negative endocrine ROS    Renal/GU negative Renal ROS     Musculoskeletal  (+) Arthritis ,    Abdominal   Peds  Hematology negative hematology ROS (+)   Anesthesia Other Findings   Reproductive/Obstetrics                              Anesthesia Physical Anesthesia Plan  ASA: 2  Anesthesia Plan: Spinal   Post-op Pain Management: Regional block*   Induction: Intravenous  PONV Risk Score and Plan: 3 and Ondansetron , Dexamethasone , Propofol  infusion and Treatment may vary due to age or medical condition  Airway Management Planned: Natural Airway and Nasal Cannula  Additional Equipment: None  Intra-op Plan:   Post-operative Plan:   Informed Consent: I have reviewed the patients History and Physical, chart, labs and discussed the procedure including the risks, benefits and alternatives for the proposed anesthesia with the patient or authorized representative who has indicated his/her understanding and acceptance.       Plan Discussed with: CRNA  Anesthesia Plan Comments: (Lab Results      Component                Value               Date                      WBC                      7.5                 05/06/2024                HGB                      14.6                05/06/2024                HCT                      42.8                 05/06/2024                MCV                      94.5                05/06/2024  PLT                      235                 05/06/2024             PAT note by Victoria Hope, PA-C: 69 year old female follows with cardiology for history of HTN, HLD, palpitations, mitral regurgitation.  Echo 10/2022 showed LVEF 65%, grade 1 DD, RV systolic function low normal, mild mitral regurgitation.  Seen by Rosaline Bane, NP on 04/05/2024 for preop evaluation.  Per note, According to the Revised Cardiac Risk Index (RCRI), her Perioperative Risk of Major Cardiac Event is (%): 0.9. Her Functional Capacity in METs is: 6.61 according to the [facility] Activity Status Index (DASI). The patient is doing well from a cardiac perspective. Therefore, based on ACC/AHA guidelines, the patient would be at acceptable risk for the planned procedure without further cardiovascular testing.  Other pertinent history includes asthma, GERD, undifferentiated connective tissue disease (maintained on hydroxychloroquine ).  CVA listed in history, however, per neurology notes, event was felt more likely to be complex migraine.  Preop labs reviewed, mild hyponatremia sodium 134, otherwise WNL.  EKG 12/15/2023: NSR.  Rate 84.  TTE 11/11/2022: 1. Left ventricular ejection fraction, by estimation, is 60 to 65%. The  left ventricle has normal function. The left ventricle has no regional  wall motion abnormalities. Left ventricular diastolic parameters are  consistent with Grade I diastolic  dysfunction (impaired relaxation).  2. Right ventricular systolic function is low normal. The right  ventricular size is normal. There is normal pulmonary artery systolic  pressure.  3. The mitral valve is abnormal. Mild mitral valve regurgitation. No  evidence of mitral stenosis.  4. The aortic valve is tricuspid. There is mild calcification of the  aortic valve. Aortic valve regurgitation is not visualized. No aortic  stenosis is  present.  5. The inferior vena cava is normal in size with greater than 50%  respiratory variability, suggesting right atrial pressure of 3 mmHg.   Comparison(s): Changes from prior study are noted. MR is mild now  (compared to moderate in 2020).   )         Anesthesia Quick Evaluation  "

## 2024-05-07 NOTE — Progress Notes (Addendum)
 Anesthesia Chart Review:  69 year old female follows with cardiology for history of HTN, HLD, palpitations, mitral regurgitation.  Echo 10/2022 showed LVEF 65%, grade 1 DD, RV systolic function low normal, mild mitral regurgitation.  Seen by Rosaline Bane, NP on 04/05/2024 for preop evaluation.  Per note, According to the Revised Cardiac Risk Index (RCRI), her Perioperative Risk of Major Cardiac Event is (%): 0.9. Her Functional Capacity in METs is: 6.61 according to the Duke Activity Status Index (DASI). The patient is doing well from a cardiac perspective. Therefore, based on ACC/AHA guidelines, the patient would be at acceptable risk for the planned procedure without further cardiovascular testing.   Other pertinent history includes asthma, GERD, undifferentiated connective tissue disease (maintained on hydroxychloroquine ).  CVA listed in history, however, per neurology notes, event was felt more likely to be complex migraine.  Pt is s/p L4-S1 posterior lumbar fusion.   Preop labs reviewed, mild hyponatremia sodium 134, otherwise WNL.  EKG 12/15/2023: NSR.  Rate 84.  TTE 11/11/2022: 1. Left ventricular ejection fraction, by estimation, is 60 to 65%. The  left ventricle has normal function. The left ventricle has no regional  wall motion abnormalities. Left ventricular diastolic parameters are  consistent with Grade I diastolic  dysfunction (impaired relaxation).   2. Right ventricular systolic function is low normal. The right  ventricular size is normal. There is normal pulmonary artery systolic  pressure.   3. The mitral valve is abnormal. Mild mitral valve regurgitation. No  evidence of mitral stenosis.   4. The aortic valve is tricuspid. There is mild calcification of the  aortic valve. Aortic valve regurgitation is not visualized. No aortic  stenosis is present.   5. The inferior vena cava is normal in size with greater than 50%  respiratory variability, suggesting right atrial  pressure of 3 mmHg.   Comparison(s): Changes from prior study are noted. MR is mild now  (compared to moderate in 2020).      Lynwood Geofm RIGGERS [facility] Short Stay Center/Anesthesiology Phone [phone] 05/07/2024 9:34 AM

## 2024-05-10 ENCOUNTER — Encounter: Payer: Self-pay | Admitting: *Deleted

## 2024-05-13 ENCOUNTER — Encounter (HOSPITAL_COMMUNITY): Payer: Self-pay | Admitting: Orthopedic Surgery

## 2024-05-13 ENCOUNTER — Other Ambulatory Visit: Payer: Self-pay

## 2024-05-13 ENCOUNTER — Encounter (HOSPITAL_COMMUNITY): Payer: Self-pay | Admitting: Physician Assistant

## 2024-05-13 ENCOUNTER — Other Ambulatory Visit (HOSPITAL_COMMUNITY): Payer: Self-pay

## 2024-05-13 ENCOUNTER — Ambulatory Visit (HOSPITAL_COMMUNITY): Admitting: Anesthesiology

## 2024-05-13 ENCOUNTER — Encounter (HOSPITAL_COMMUNITY): Admission: RE | Disposition: A | Payer: Self-pay | Source: Ambulatory Visit | Attending: Orthopedic Surgery

## 2024-05-13 ENCOUNTER — Observation Stay (HOSPITAL_COMMUNITY)
Admission: RE | Admit: 2024-05-13 | Discharge: 2024-05-14 | Disposition: A | Discharge status: Home / Self Care | Source: Ambulatory Visit | Attending: Orthopedic Surgery | Admitting: Orthopedic Surgery

## 2024-05-13 DIAGNOSIS — I10 Essential (primary) hypertension: Secondary | ICD-10-CM | POA: Diagnosis not present

## 2024-05-13 DIAGNOSIS — Z85828 Personal history of other malignant neoplasm of skin: Secondary | ICD-10-CM | POA: Diagnosis not present

## 2024-05-13 DIAGNOSIS — Z79899 Other long term (current) drug therapy: Secondary | ICD-10-CM | POA: Insufficient documentation

## 2024-05-13 DIAGNOSIS — Z96652 Presence of left artificial knee joint: Principal | ICD-10-CM

## 2024-05-13 DIAGNOSIS — M1712 Unilateral primary osteoarthritis, left knee: Principal | ICD-10-CM | POA: Insufficient documentation

## 2024-05-13 DIAGNOSIS — Z8673 Personal history of transient ischemic attack (TIA), and cerebral infarction without residual deficits: Secondary | ICD-10-CM | POA: Diagnosis not present

## 2024-05-13 DIAGNOSIS — J45909 Unspecified asthma, uncomplicated: Secondary | ICD-10-CM | POA: Insufficient documentation

## 2024-05-13 DIAGNOSIS — M25562 Pain in left knee: Secondary | ICD-10-CM | POA: Diagnosis present

## 2024-05-13 HISTORY — PX: TOTAL KNEE ARTHROPLASTY: SHX125

## 2024-05-13 MED ORDER — METOCLOPRAMIDE HCL 5 MG/ML IJ SOLN: 5.0000 mg | Freq: Three times a day (TID) | INTRAMUSCULAR | Status: DC | PRN

## 2024-05-13 MED ORDER — ALUM & MAG HYDROXIDE-SIMETH 200-200-20 MG/5ML PO SUSP: 30.0000 mL | ORAL | Status: DC | PRN

## 2024-05-13 MED ORDER — TRANEXAMIC ACID-NACL 1000-0.7 MG/100ML-% IV SOLN
1000.0000 mg | INTRAVENOUS | Status: AC
  Administered 2024-05-13: 1000 mg via INTRAVENOUS
  Filled 2024-05-13: qty 100

## 2024-05-13 MED ORDER — ORAL CARE MOUTH RINSE
15.0000 mL | Freq: Once | OROMUCOSAL | Status: AC
  Administered 2024-05-13: 15 mL via OROMUCOSAL

## 2024-05-13 MED ORDER — SENNA 8.6 MG PO TABS
2.0000 | ORAL_TABLET | Freq: Every day | ORAL | Status: DC
  Administered 2024-05-13: 17.2 mg via ORAL
  Filled 2024-05-13: qty 2

## 2024-05-13 MED ORDER — RIMEGEPANT SULFATE 75 MG PO TBDP: 75.0000 mg | ORAL_TABLET | Freq: Every day | ORAL | Status: DC | PRN

## 2024-05-13 MED ORDER — BUPIVACAINE-EPINEPHRINE (PF) 0.25% -1:200000 IJ SOLN
INTRAMUSCULAR | Status: AC
  Filled 2024-05-13: qty 30

## 2024-05-13 MED ORDER — HYDROMORPHONE HCL 1 MG/ML IJ SOLN: 0.5000 mg | INTRAMUSCULAR | Status: DC | PRN

## 2024-05-13 MED ORDER — SODIUM CHLORIDE 0.9 % IV SOLN: INTRAVENOUS | Status: DC

## 2024-05-13 MED ORDER — MIDAZOLAM HCL (PF) 2 MG/2ML IJ SOLN
1.0000 mg | Freq: Once | INTRAMUSCULAR | Status: AC
  Administered 2024-05-13: 1 mg via INTRAVENOUS

## 2024-05-13 MED ORDER — ROSUVASTATIN CALCIUM 20 MG PO TABS
20.0000 mg | ORAL_TABLET | Freq: Every day | ORAL | Status: DC
  Administered 2024-05-14: 20 mg via ORAL
  Filled 2024-05-13: qty 1

## 2024-05-13 MED ORDER — LACTATED RINGERS IV SOLN: INTRAVENOUS | Status: DC

## 2024-05-13 MED ORDER — ROPIVACAINE HCL 5 MG/ML IJ SOLN
INTRAMUSCULAR | Status: DC | PRN
  Administered 2024-05-13: 20 mL via PERINEURAL

## 2024-05-13 MED ORDER — SODIUM CHLORIDE 0.9 % IR SOLN
Status: DC | PRN
  Administered 2024-05-13: 1000 mL

## 2024-05-13 MED ORDER — 0.9 % SODIUM CHLORIDE (POUR BTL) OPTIME
TOPICAL | Status: DC | PRN
  Administered 2024-05-13: 1000 mL

## 2024-05-13 MED ORDER — TRANEXAMIC ACID-NACL 1000-0.7 MG/100ML-% IV SOLN
1000.0000 mg | Freq: Once | INTRAVENOUS | Status: AC
  Administered 2024-05-13: 1000 mg via INTRAVENOUS
  Filled 2024-05-13: qty 100

## 2024-05-13 MED ORDER — DEXAMETHASONE SOD PHOSPHATE PF 10 MG/ML IJ SOLN
INTRAMUSCULAR | Status: AC
  Filled 2024-05-13: qty 1

## 2024-05-13 MED ORDER — TRAZODONE HCL 50 MG PO TABS
150.0000 mg | ORAL_TABLET | Freq: Every day | ORAL | Status: DC
  Administered 2024-05-13: 150 mg via ORAL
  Filled 2024-05-13: qty 1

## 2024-05-13 MED ORDER — FENTANYL CITRATE (PF) 100 MCG/2ML IJ SOLN
INTRAMUSCULAR | Status: DC | PRN
  Administered 2024-05-13 (×2): 50 ug via INTRAVENOUS

## 2024-05-13 MED ORDER — ASPIRIN 81 MG PO CHEW
81.0000 mg | CHEWABLE_TABLET | Freq: Two times a day (BID) | ORAL | Status: DC
  Administered 2024-05-13 – 2024-05-14 (×2): 81 mg via ORAL
  Filled 2024-05-13 (×2): qty 1

## 2024-05-13 MED ORDER — KETAMINE HCL 10 MG/ML IJ SOLN
INTRAMUSCULAR | Status: DC | PRN
  Administered 2024-05-13: 20 mg via INTRAVENOUS

## 2024-05-13 MED ORDER — CHLORHEXIDINE GLUCONATE 0.12 % MT SOLN: 15.0000 mL | Freq: Once | OROMUCOSAL | Status: AC

## 2024-05-13 MED ORDER — MEPERIDINE HCL 25 MG/ML IJ SOLN: 6.2500 mg | INTRAMUSCULAR | Status: DC | PRN

## 2024-05-13 MED ORDER — CEFAZOLIN SODIUM-DEXTROSE 2-4 GM/100ML-% IV SOLN
2.0000 g | Freq: Four times a day (QID) | INTRAVENOUS | Status: AC
  Administered 2024-05-13 (×2): 2 g via INTRAVENOUS
  Filled 2024-05-13 (×2): qty 100

## 2024-05-13 MED ORDER — BISACODYL 10 MG RE SUPP: 10.0000 mg | Freq: Every day | RECTAL | Status: DC | PRN

## 2024-05-13 MED ORDER — OXYCODONE HCL 5 MG PO TABS
5.0000 mg | ORAL_TABLET | ORAL | Status: DC | PRN
  Administered 2024-05-13: 10 mg via ORAL
  Administered 2024-05-14 (×2): 5 mg via ORAL
  Filled 2024-05-13: qty 2
  Filled 2024-05-13 (×2): qty 1

## 2024-05-13 MED ORDER — POLYETHYLENE GLYCOL 3350 17 G PO PACK
17.0000 g | PACK | Freq: Two times a day (BID) | ORAL | Status: DC
  Administered 2024-05-14: 17 g via ORAL
  Filled 2024-05-13: qty 1

## 2024-05-13 MED ORDER — HYDROCHLOROTHIAZIDE 12.5 MG PO TABS
12.5000 mg | ORAL_TABLET | Freq: Every day | ORAL | Status: DC
  Administered 2024-05-14: 12.5 mg via ORAL
  Filled 2024-05-13: qty 1

## 2024-05-13 MED ORDER — PHENYLEPHRINE 80 MCG/ML (10ML) SYRINGE FOR IV PUSH (FOR BLOOD PRESSURE SUPPORT)
PREFILLED_SYRINGE | INTRAVENOUS | Status: AC
  Filled 2024-05-13: qty 10

## 2024-05-13 MED ORDER — PHENOL 1.4 % MT LIQD: 1.0000 | OROMUCOSAL | Status: DC | PRN

## 2024-05-13 MED ORDER — POVIDONE-IODINE 10 % EX SWAB: 2.0000 | Freq: Once | CUTANEOUS | Status: DC

## 2024-05-13 MED ORDER — OXYCODONE HCL 5 MG PO TABS: 10.0000 mg | ORAL_TABLET | ORAL | Status: DC | PRN

## 2024-05-13 MED ORDER — HYDROXYCHLOROQUINE SULFATE 200 MG PO TABS
200.0000 mg | ORAL_TABLET | Freq: Every day | ORAL | Status: DC
  Administered 2024-05-14: 200 mg via ORAL
  Filled 2024-05-13: qty 1

## 2024-05-13 MED ORDER — DIPHENHYDRAMINE HCL 12.5 MG/5ML PO ELIX: 12.5000 mg | ORAL_SOLUTION | ORAL | Status: DC | PRN

## 2024-05-13 MED ORDER — ONDANSETRON HCL 4 MG PO TABS: 4.0000 mg | ORAL_TABLET | Freq: Four times a day (QID) | ORAL | Status: DC | PRN

## 2024-05-13 MED ORDER — FENTANYL CITRATE (PF) 50 MCG/ML IJ SOSY
50.0000 ug | PREFILLED_SYRINGE | Freq: Once | INTRAMUSCULAR | Status: AC
  Administered 2024-05-13: 50 ug via INTRAVENOUS

## 2024-05-13 MED ORDER — PROPOFOL 500 MG/50ML IV EMUL
INTRAVENOUS | Status: DC | PRN
  Administered 2024-05-13: 50 ug/kg/min via INTRAVENOUS

## 2024-05-13 MED ORDER — SODIUM CHLORIDE (PF) 0.9 % IJ SOLN
INTRAMUSCULAR | Status: AC
  Filled 2024-05-13: qty 50

## 2024-05-13 MED ORDER — DEXMEDETOMIDINE HCL IN NACL 80 MCG/20ML IV SOLN
INTRAVENOUS | Status: DC | PRN
  Administered 2024-05-13: 8 ug via INTRAVENOUS

## 2024-05-13 MED ORDER — PHENYLEPHRINE HCL (PRESSORS) 10 MG/ML IV SOLN
INTRAVENOUS | Status: DC | PRN
  Administered 2024-05-13 (×2): 100 ug via INTRAVENOUS

## 2024-05-13 MED ORDER — CEFAZOLIN SODIUM-DEXTROSE 2-4 GM/100ML-% IV SOLN
2.0000 g | INTRAVENOUS | Status: AC
  Administered 2024-05-13: 2 g via INTRAVENOUS
  Filled 2024-05-13: qty 100

## 2024-05-13 MED ORDER — LIDOCAINE HCL (PF) 2 % IJ SOLN
INTRAMUSCULAR | Status: AC
  Filled 2024-05-13: qty 5

## 2024-05-13 MED ORDER — PHENYLEPHRINE HCL-NACL 20-0.9 MG/250ML-% IV SOLN
INTRAVENOUS | Status: AC
  Filled 2024-05-13: qty 250

## 2024-05-13 MED ORDER — CELECOXIB 200 MG PO CAPS
200.0000 mg | ORAL_CAPSULE | Freq: Two times a day (BID) | ORAL | Status: DC
  Administered 2024-05-13 – 2024-05-14 (×2): 200 mg via ORAL
  Filled 2024-05-13 (×2): qty 1

## 2024-05-13 MED ORDER — SODIUM CHLORIDE (PF) 0.9 % IJ SOLN
INTRAMUSCULAR | Status: DC | PRN
  Administered 2024-05-13: 61 mL

## 2024-05-13 MED ORDER — OXYCODONE HCL 5 MG/5ML PO SOLN: 5.0000 mg | Freq: Once | ORAL | Status: DC | PRN

## 2024-05-13 MED ORDER — ONDANSETRON HCL 4 MG/2ML IJ SOLN: 4.0000 mg | Freq: Four times a day (QID) | INTRAMUSCULAR | Status: DC | PRN

## 2024-05-13 MED ORDER — OXYCODONE HCL 5 MG PO TABS: 5.0000 mg | ORAL_TABLET | Freq: Once | ORAL | Status: DC | PRN

## 2024-05-13 MED ORDER — DROPERIDOL 2.5 MG/ML IJ SOLN: 0.6250 mg | Freq: Once | INTRAMUSCULAR | Status: DC | PRN

## 2024-05-13 MED ORDER — ONDANSETRON HCL 4 MG/2ML IJ SOLN
INTRAMUSCULAR | Status: DC | PRN
  Administered 2024-05-13: 4 mg via INTRAVENOUS

## 2024-05-13 MED ORDER — KETAMINE HCL 50 MG/5ML IJ SOSY
PREFILLED_SYRINGE | INTRAMUSCULAR | Status: AC
  Filled 2024-05-13: qty 5

## 2024-05-13 MED ORDER — FENTANYL CITRATE (PF) 50 MCG/ML IJ SOSY
PREFILLED_SYRINGE | INTRAMUSCULAR | Status: AC
  Filled 2024-05-13: qty 2

## 2024-05-13 MED ORDER — EZETIMIBE 10 MG PO TABS
10.0000 mg | ORAL_TABLET | Freq: Every day | ORAL | Status: DC
  Administered 2024-05-14: 10 mg via ORAL
  Filled 2024-05-13: qty 1

## 2024-05-13 MED ORDER — ACETAMINOPHEN 10 MG/ML IV SOLN: 1000.0000 mg | Freq: Once | INTRAVENOUS | Status: DC | PRN

## 2024-05-13 MED ORDER — IRBESARTAN 150 MG PO TABS
150.0000 mg | ORAL_TABLET | Freq: Every day | ORAL | Status: DC
  Administered 2024-05-14: 150 mg via ORAL
  Filled 2024-05-13: qty 1

## 2024-05-13 MED ORDER — ACETAMINOPHEN 500 MG PO TABS
1000.0000 mg | ORAL_TABLET | Freq: Four times a day (QID) | ORAL | Status: DC
  Administered 2024-05-13 – 2024-05-14 (×4): 1000 mg via ORAL
  Filled 2024-05-13 (×4): qty 2

## 2024-05-13 MED ORDER — GABAPENTIN 300 MG PO CAPS: 300.0000 mg | ORAL_CAPSULE | Freq: Every evening | ORAL | Status: DC | PRN

## 2024-05-13 MED ORDER — PROPOFOL 1000 MG/100ML IV EMUL
INTRAVENOUS | Status: AC
  Filled 2024-05-13: qty 100

## 2024-05-13 MED ORDER — MIDAZOLAM HCL 2 MG/2ML IJ SOLN
INTRAMUSCULAR | Status: AC
  Filled 2024-05-13: qty 2

## 2024-05-13 MED ORDER — LIDOCAINE HCL (CARDIAC) PF 100 MG/5ML IV SOSY
PREFILLED_SYRINGE | INTRAVENOUS | Status: DC | PRN
  Administered 2024-05-13: 30 mg via INTRAVENOUS

## 2024-05-13 MED ORDER — CYCLOBENZAPRINE HCL 5 MG PO TABS
5.0000 mg | ORAL_TABLET | Freq: Three times a day (TID) | ORAL | Status: DC | PRN
  Administered 2024-05-14 (×2): 5 mg via ORAL
  Filled 2024-05-13 (×2): qty 1

## 2024-05-13 MED ORDER — FENTANYL CITRATE (PF) 100 MCG/2ML IJ SOLN
INTRAMUSCULAR | Status: AC
  Filled 2024-05-13: qty 2

## 2024-05-13 MED ORDER — VALSARTAN-HYDROCHLOROTHIAZIDE 160-12.5 MG PO TABS: 1.0000 | ORAL_TABLET | Freq: Every day | ORAL | Status: DC

## 2024-05-13 MED ORDER — PHENYLEPHRINE HCL-NACL 20-0.9 MG/250ML-% IV SOLN
INTRAVENOUS | Status: DC | PRN
  Administered 2024-05-13: 30 ug/min via INTRAVENOUS

## 2024-05-13 MED ORDER — KETOROLAC TROMETHAMINE 30 MG/ML IJ SOLN
INTRAMUSCULAR | Status: AC
  Filled 2024-05-13: qty 1

## 2024-05-13 MED ORDER — MUPIROCIN 2 % EX OINT
1.0000 | TOPICAL_OINTMENT | Freq: Two times a day (BID) | CUTANEOUS | 0 refills | Status: AC
  Filled 2024-05-13: qty 22, 11d supply, fill #0

## 2024-05-13 MED ORDER — DEXAMETHASONE SOD PHOSPHATE PF 10 MG/ML IJ SOLN
8.0000 mg | Freq: Once | INTRAMUSCULAR | Status: AC
  Administered 2024-05-13: 8 mg via INTRAVENOUS

## 2024-05-13 MED ORDER — METOCLOPRAMIDE HCL 5 MG PO TABS: 5.0000 mg | ORAL_TABLET | Freq: Three times a day (TID) | ORAL | Status: DC | PRN

## 2024-05-13 MED ORDER — MEPIVACAINE HCL (PF) 2 % IJ SOLN
INTRAMUSCULAR | Status: DC | PRN
  Administered 2024-05-13: 60 mg via EPIDURAL

## 2024-05-13 MED ORDER — HYDROMORPHONE HCL 1 MG/ML IJ SOLN: 0.2500 mg | INTRAMUSCULAR | Status: DC | PRN

## 2024-05-13 MED ORDER — MENTHOL 3 MG MT LOZG: 1.0000 | LOZENGE | OROMUCOSAL | Status: DC | PRN

## 2024-05-13 MED ORDER — DEXAMETHASONE SOD PHOSPHATE PF 10 MG/ML IJ SOLN
10.0000 mg | Freq: Once | INTRAMUSCULAR | Status: AC
  Administered 2024-05-14: 10 mg via INTRAVENOUS
  Filled 2024-05-13: qty 1

## 2024-05-13 NOTE — Op Note (Signed)
 " NAME:  Victoria Cooper                      MEDICAL RECORD NO.:  [id number]                             FACILITY:  [facility]      PHYSICIAN:  Donnice BIRCH. Ernie, M.D.  DATE OF BIRTH:  1955-09-04      DATE OF PROCEDURE:  05/13/2024                                     OPERATIVE REPORT         PREOPERATIVE DIAGNOSIS:  left knee osteoarthritis.      POSTOPERATIVE DIAGNOSIS:  {left knee osteoarthritis.      FINDINGS:  The patient was noted to have complete loss of cartilage and   bone-on-bone arthritis with associated osteophytes in the medial and patellofemoral compartments of   the knee with a significant synovitis and associated effusion.  The patient had failed months of conservative treatment including medications, injection therapy, activity modification.     PROCEDURE:  left total knee replacement.      COMPONENTS USED:  DePuy Attune fixed bearing cruciate retaining medial stabilized knee   system, a size 4N femur, 3 tibia, size 6 mm CR MS AOX insert, and 29 anatomic patellar   button.      SURGEON:  Donnice BIRCH. Ernie, M.D.      ASSISTANT:  Rosina Calin, PA-C.      ANESTHESIA:  Regional and Spinal.      SPECIMENS:  None.      COMPLICATION:  None.      DRAINS:  None.  EBL: 150cc      TOURNIQUET TIME:  tourniquet was not used      The patient was stable to the recovery room.      INDICATION FOR PROCEDURE:  Victoria Cooper is a 69 y.o. female patient of   mine.  The patient had been seen, evaluated, and treated for months conservatively in the   office with medication, activity modification, and injections.  The patient had   radiographic changes of complete loss of joint space with endplate sclerosis and osteophytes noted.  Based on the radiographic changes and failed conservative measures, the patient   decided to proceed with total knee replacement as definitive treatment.  Risks of infection, DVT, component failure, stiffness and the need for revision surgery,  neurovascular injury were reviewed in the office setting.  The postop course was reviewed stressing the efforts to maximize post-operative range of motion, satisfaction and function.  Consent was obtained for benefit of pain   relief.      PROCEDURE IN DETAIL:  The patient was brought to the operative theater.   Once adequate anesthesia, preoperative antibiotics, 2 gm of Ancef ,1 gm of Tranexamic Acid , and 10 mg of Decadron  administered, the patient was positioned supine with bony prominences padded and protected.  The  left lower extremity was prepped and draped in sterile fashion.  A time-   out was performed identifying the patient, planned procedure, and the appropriate extremity.      The left lower extremity was placed in the [facility] leg holder.  A midline incision was   made followed by median parapatellar arthrotomy.  Following initial   exposure, attention  was first directed to the patella.  Precut   measurement was noted to be 17 mm.  I resected down to 12 mm and used a   29 anatomic patellar button to restore patellar height as well as cover the cut surface.  Limited lateral facetecomy was performed.     The lug holes were drilled and a metal shim was placed to protect the   patella from retractors and saw blade during the procedure.      At this point, attention was now directed to the femur.  The femoral   canal was opened with a drill, irrigated to try to prevent fat emboli.  An   intramedullary rod was passed at 3 degrees valgus, 8 mm of bone was   resected off the distal femur.  Following this resection, the tibia was   subluxated anteriorly.  Using the extramedullary guide, 2 mm of bone was resected off   the proximal medial tibia.  We confirmed the gap would be   stable medially and laterally with a size 5 spacer block as well as confirmed that the tibial cut was perpendicular in the coronal plane, checking with an alignment rod.      Once this was done, I sized the femur to  be a size 4 in the anterior-   posterior dimension and chose a narrow component based on medial and   lateral dimension.  The size 4 rotation block was then pinned in   position anterior referenced using the C-clamp to set rotation.  The   anterior, posterior, and  chamfer cuts were made without difficulty nor   notching making certain that I was along the anterior cortex to help   with flexion gap stability.      The final femoral shim cut was made off the lateral aspect of distal femur.      At this point, the tibia was sized to be a size 3.  The size 3 tray was   then pinned in position through the medial third of the tubercle,   drilled, and keel punched.  Trial reduction was now carried with a 4 femur,  3 tibia, a size 6 mm CR insert, and the 29 anatomic patella botton.  The knee was brought to full extension with good flexion stability with the patella tracking through the trochlea without application of pressure.  Given   all these findings the trial components removed.  Final components were   opened and cement was mixed.  The knee was irrigated with normal saline solution and pulse lavage.  The posterior synovial capsule was then injected with 30 cc of 0.25% Marcaine  with epinephrine , 1 cc of Toradol  and 30 cc of NS for a total of 61 cc.     Final implants were then cemented onto cleaned and dried cut surfaces of bone with the knee brought to extension with a size 6 mm CR trial insert.      Once the cement had fully cured, excessive cement was removed   throughout the knee.  I confirmed that I was satisfied with the range of   motion and stability, and the final size 6 mm CR MS AOX insert was chosen and it was impacted into the tibial tray.     At this point in the case no significant   hemostasis was required.  The extensor mechanism was then reapproximated using #1 Vicryl and #1 Stratafix sutures with the knee   in flexion.  The  remaining wound was closed with 2-0 Vicryl and  running 4-0 Monocryl.   The knee was cleaned, dried, dressed sterilely using Dermabond and   Aquacel dressing.  The patient was then   brought to recovery room in stable condition, tolerating the procedure   well.   Please note that PA Patti was present for the entirety of the case, and was utilized for pre-operative positioning, peri-operative retractor management, general facilitation of the procedure and for primary wound closure at the end of the case.              Donnice CORDOBA Ernie, M.D.    05/13/2024 9:57 AM "

## 2024-05-13 NOTE — Discharge Instructions (Signed)

## 2024-05-13 NOTE — Interval H&P Note (Signed)
 History and Physical Interval Note:  05/13/2024 6:51 AM  Victoria Cooper  has presented today for surgery, with the diagnosis of Left knee osteoarthritis.  The various methods of treatment have been discussed with the patient and family. After consideration of risks, benefits and other options for treatment, the patient has consented to  Procedures: ARTHROPLASTY, KNEE, TOTAL (Left) as a surgical intervention.  The patient's history has been reviewed, patient examined, no change in status, stable for surgery.  I have reviewed the patient's chart and labs.  Questions were answered to the patient's satisfaction.     [name]

## 2024-05-13 NOTE — H&P (Signed)
 TOTAL KNEE ADMISSION H&P  Patient is being admitted for left total knee arthroplasty.  Therapy Plans: outpatient therapy at at [facility] in [place] Disposition: Home with husband and friends helping Planned DVT Prophylaxis: aspirin  81mg  BID DME needed: walker PCP: Dr. 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  HPI: Victoria Cooper, 69 y.o. female has a history of pain and functional disability in the left knee due to osteoarthritis and has failed non-surgical conservative treatments for greater than 12 weeks to include NSAID's and/or analgesics, corticosteriod injections, and activity modification. Onset of symptoms was gradual, starting 2 years ago with gradually worsening course since that time. The patient noted no past surgery on the left knee.  Patient currently rates pain in the left knee at 8 out of 10 with activity. Patient has worsening of pain with activity and weight bearing and pain that interferes with activities of daily living. Patient has evidence of joint space narrowing by imaging studies. There is no active infection.  Patient Active Problem List   Diagnosis Date Noted   Vaginal atrophy 05/05/2023   Incontinence of feces 05/05/2023   Mixed stress and urge urinary incontinence 05/05/2023   Pelvic pain 05/05/2023   Pelvic organ prolapse quantification stage 2 cystocele 05/05/2023   Female hypogonadism syndrome 01/13/2019   Vitamin D deficiency disease 01/13/2019   Classical migraine with intractable migraine 11/30/2015   Upper airway cough syndrome  04/07/2013   Essential hypertension, benign 04/07/2013   Extrinsic asthma 03/05/2013   Animal bite 03/18/2011    Past Medical History:  Diagnosis Date   Abnormal Pap smear of cervix 2006   Repeat Pap normal   Allergy    Animal bite 03/18/2011   Dog Bite   Arthritis    Asthma    Classical migraine with intractable migraine 11/30/2015   Complication of anesthesia    passed out after back surgery   Connective tissue disease, undifferentiated 2015   Dr. Mai   CVA (cerebral vascular accident) [facility])    Female hypogonadism syndrome 01/13/2019   GERD (gastroesophageal reflux disease)    Hyperlipidemia    Hypertension    Migraine    Skin cancer    Vitamin D deficiency disease 01/13/2019   Wears glasses     Past Surgical History:  Procedure Laterality Date   BACK SURGERY  2009   lumb lam-fusion   Basil cell     BREAST REDUCTION SURGERY  05/26/2012   Procedure: MAMMARY REDUCTION  (BREAST);  Surgeon: Kayla Pray, MD;  Location:  SURGERY CENTER;  Service: Plastics;  Laterality: Bilateral;   COLONOSCOPY  2003, 2018   diverticulosis   VAGINAL HYSTERECTOMY  2003   hyst    Prior to Admission medications  Medication Sig Start Date End Date Taking? Authorizing Provider  aspirin  EC 81 MG tablet Take 81 mg by mouth daily. Swallow whole.   Yes [provider]  b complex vitamins capsule Take 1 capsule by mouth daily.   Yes [provider]  celecoxib  (CELEBREX ) 100 MG capsule Take 1 capsule (100 mg total) by mouth 2 (two) times daily  as needed. Patient taking differently: Take 100 mg by mouth 2 (two) times daily. 03/08/24  Yes   Cholecalciferol (VITAMIN D-3) 125 MCG (5000 UT) TABS Take 10,000 Units by mouth daily.   Yes [provider]  cyclobenzaprine  (FLEXERIL ) 5 MG tablet Take 1-2 tablets by mouth at bedtime needed for muscle pain or spasm Patient taking differently: Take 5 mg by mouth at bedtime. 04/19/24  Yes   ezetimibe  (ZETIA ) 10 MG  tablet Take 1 tablet (10 mg total) by mouth daily. 10/23/23  Yes   gabapentin  (NEURONTIN ) 300 MG capsule Take 1 capsule (300 mg total) by mouth at bedtime. Patient taking differently: Take 300 mg by mouth at bedtime as needed (pain). 04/12/24  Yes   hydroxychloroquine  (PLAQUENIL ) 200 MG tablet Take 1 tablet (200 mg total) by mouth daily. 03/08/24  Yes   NONFORMULARY OR COMPOUNDED ITEM Testosterone  cream 0.1%.  Apply 0.5 grams daily to skin of arm, leg, or abdomen and rotate site.  Disp:  60 grams, RF:  none.  [facility]. Patient taking differently: daily as needed. Testosterone  cream 0.1%.  Apply 0.5 grams daily to skin of arm, leg, or abdomen and rotate site.  Disp:  60 grams, RF:  none.  [facility]. 06/24/23  Yes Amundson JAYSON Nikki Bobie FORBES, MD  OVER THE COUNTER MEDICATION Take 1 tablet by mouth at bedtime. magnesium l threonate   Yes [provider]  Rimegepant Sulfate  (NURTEC) 75 MG TBDP Take 75 mg by mouth daily as needed (migraine).   Yes [provider]  rosuvastatin  (CRESTOR ) 20 MG tablet Take 1 tablet (20 mg total) by mouth daily. 04/11/24  Yes   tiZANidine  (ZANAFLEX ) 4 MG tablet Take 1 tablet (4 mg total) by mouth 2 (two) times daily as needed for up to 30 days 03/10/24  Yes   traZODone  (DESYREL ) 150 MG tablet Take 1 tablet (150 mg total) by mouth at bedtime. 01/29/24  Yes   valsartan -hydrochlorothiazide  (DIOVAN -HCT) 160-12.5 MG tablet Take 1 tablet by mouth daily. 11/06/23  Yes   diclofenac  (VOLTAREN ) 75 MG EC tablet Take 1 tablet twice a day by oral route with meal(s). Patient not taking: Reported on 05/03/2024 02/09/24     estradiol  (ESTRACE ) 0.5 MG tablet Take 1 tablet (0.5 mg total) by mouth daily. 05/04/24     mirabegron  ER (MYRBETRIQ ) 25 MG TB24 tablet Take 1 tablet (25 mg total) by mouth daily. Patient not taking: Reported on 12/15/2023 09/25/23   Guadlupe Lianne DASEN, MD    Allergies[1]  Social History   Socioeconomic History   Marital status: Married     Spouse name: Marcey   Number of children: 3   Years of education: Master's   Highest education level: Not on file  Occupational History   Occupation: Chaplain    Comment: Estate Manager/land Agent    Comment: retired  Tobacco Use   Smoking status: Never   Smokeless tobacco: Never  Vaping Use   Vaping status: Never Used  Substance and Sexual Activity   Alcohol use: Yes    Alcohol/week: 0.0 - 2.0 standard drinks of alcohol    Comment: occ   Drug use: No   Sexual activity: Yes    Partners: Male    Birth control/protection: Surgical    Comment: hysterectomy; less than 5, after 17, no STD, no abnormal pap, no DES expsoure  Other Topics Concern   Not on file  Social History Narrative   Lives at home w/ her husband   Retired  chaplain.   Right-handed   Caffeine: 1-3 cups of coffee per day   Social Drivers of Health   Tobacco Use: Low Risk (05/06/2024)   Patient History    Smoking Tobacco Use: Never    Smokeless Tobacco Use: Never    Passive Exposure: Not on file  Financial Resource Strain: Low Risk  (03/10/2024)   Received from [facility]   Overall Financial Resource Strain (CARDIA)    Difficulty of Paying Living Expenses: Not hard at all  Food Insecurity: No Food Insecurity (03/10/2024)   Received from [facility]   Epic    Within the past 12 months, you worried that your food would run out before you got the money to buy more.: Never true    Within the past 12 months, the food you bought just didn't last and you didn't have money to get more.: Never true  Transportation Needs: No Transportation Needs (03/10/2024)   Received from [facility] - Transportation    In the past 12 months, has lack of transportation kept you from medical appointments or from getting medications?: No    Lack of Transportation (Non-Medical): No  Physical Activity: Not on file  Stress: Not on file  Social Connections: Not on file  Intimate Partner  Violence: Not on file  Depression (EYV7-0): Not on file  Alcohol Screen: Not on file  Housing: Low Risk  (03/10/2024)   Received from [facility]   Epic    In the last 12 months, was there a time when you were not able to pay the mortgage or rent on time?: No    In the past 12 months, how many times have you moved where you were living?: 0    At any time in the past 12 months, were you homeless or living in a shelter (including now)?: No  Utilities: Not At Risk (03/10/2024)   Received from [facility]   Epic    In the past 12 months has the electric, gas, oil, or water company threatened to shut off services in your home?: No  Health Literacy: Not on file    Tobacco Use: Low Risk (05/06/2024)   Patient History    Smoking Tobacco Use: Never    Smokeless Tobacco Use: Never    Passive Exposure: Not on file   Social History   Substance and Sexual Activity  Alcohol Use Yes   Alcohol/week: 0.0 - 2.0 standard drinks of alcohol   Comment: occ    Family History  Problem Relation Age of Onset   Lung cancer Mother        never smoker   Heart disease Father    Dementia Father    Ulcerative colitis Brother    Heart failure Maternal Grandmother    ALS Maternal Grandfather    Melanoma Paternal Grandmother    Brain cancer Paternal Grandmother    Stroke Paternal Grandfather    Migraines Neg Hx    Colon cancer Neg Hx     Review Of Systems: Constitutional: Constitutional: no fever, chills, night sweats, or significant weight loss. Cardiovascular: Cardiovascular: no palpitations or chest pain. Respiratory: Respiratory: no cough or shortness of breath and No COPD. Gastrointestinal: Gastrointestinal: no vomiting or nausea. Musculoskeletal: Musculoskeletal: Joint Pain and swelling in Joints. Neurologic: Neurologic: no numbness, tingling, or difficulty with balance.  Objective:  Physical Exam: Left knee exam: No palpable effusion, warmth  erythema Slight flexion contracture with  flexion of 120 degrees Mild genu varum bilaterally Tenderness medially left greater than right Mild tightness with mild crepitation with flexion Normal ipsilateral left hip exam without groin pain or referred pain  Vital signs in last 24 hours: Temp:  [97.8 F (36.6 C)] 97.8 F (36.6 C) (01/15 0606) Pulse Rate:  [95] 95 (01/15 0606) Resp:  [17] 17 (01/15 0606) BP: (166)/(84) 166/84 (01/15 0606) SpO2:  [98 %] 98 % (01/15 0606)  Imaging Review Plain radiographs demonstrate severe degenerative joint disease of the left knee.  The bone quality appears to be adequate for age and reported activity level.  Assessment/Plan:  End stage arthritis, left knee   The patient history, physical examination, clinical judgment of the provider and imaging studies are consistent with end stage degenerative joint disease of the left knee and total knee arthroplasty is deemed medically necessary. The treatment options including medical management, injection therapy arthroscopy and arthroplasty were discussed at length. The risks and benefits of total knee arthroplasty were presented and reviewed. The risks due to aseptic loosening, infection, stiffness, patella tracking problems, thromboembolic complications and other imponderables were discussed. The patient acknowledged the explanation, agreed to proceed with the plan and consent was signed. Patient is being admitted for inpatient treatment for surgery, pain control, PT, OT, prophylactic antibiotics, VTE prophylaxis, progressive ambulation and ADLs and discharge planning. The patient is planning to be discharged home.   Patient's anticipated LOS is less than 2 midnights, meeting these requirements: - Younger than 66 - Lives within 1 hour of care - Has a competent adult at home to recover with post-op recover - NO history of  - Chronic pain requiring opiods  - Diabetes  - Coronary Artery Disease  - Heart  failure  - Heart attack  - Stroke  - DVT/VTE  - Cardiac arrhythmia  - Respiratory Failure/COPD  - Renal failure  - Anemia  - Advanced Liver disease    Rosina Calin, PA-C Orthopedic Surgery [facility] [place] [phone]      [1]  Allergies Allergen Reactions   Morphine And Codeine  Itching   Sulfa Antibiotics Itching and Other (See Comments)    Itching in palms of hand and feet   Topamax  [Topiramate ] Other (See Comments)    Unknown to patient, remember its was bad

## 2024-05-13 NOTE — Transfer of Care (Signed)
 Immediate Anesthesia Transfer of Care Note  Patient: Victoria Cooper  Procedure(s) Performed: ARTHROPLASTY, KNEE, TOTAL (Left: Knee)  Patient Location: PACU  Anesthesia Type:Spinal  Level of Consciousness: awake, alert , and patient cooperative  Airway & Oxygen Therapy: Patient Spontanous Breathing and Patient connected to face mask oxygen  Post-op Assessment: Report given to RN and Post -op Vital signs reviewed and stable  Post vital signs: Reviewed and stable  Last Vitals:  Vitals Value Taken Time  BP 154/140 05/13/24 10:17  Temp    Pulse 74 05/13/24 10:19  Resp 10 05/13/24 10:19  SpO2 100 % 05/13/24 10:19  Vitals shown include unfiled device data.  Last Pain:  Vitals:   05/13/24 0745  TempSrc:   PainSc: 0-No pain         Complications: No notable events documented.

## 2024-05-13 NOTE — Anesthesia Postprocedure Evaluation (Signed)
"   Anesthesia Post Note  Patient: Victoria Cooper  Procedure(s) Performed: ARTHROPLASTY, KNEE, TOTAL (Left: Knee)     Patient location during evaluation: PACU Anesthesia Type: Spinal Level of consciousness: oriented and awake and alert Pain management: pain level controlled Vital Signs Assessment: post-procedure vital signs reviewed and stable Respiratory status: spontaneous breathing, respiratory function stable and patient connected to nasal cannula oxygen Cardiovascular status: blood pressure returned to baseline and stable Postop Assessment: no headache, no backache and no apparent nausea or vomiting Anesthetic complications: no   No notable events documented.  Last Vitals:  Vitals:   05/13/24 1315 05/13/24 1412  BP:  (!) 146/78  Pulse: 90 88  Resp: 16 18  Temp: (!) 36.1 C   SpO2: 95% 97%    Last Pain:  Vitals:   05/13/24 1230  TempSrc:   PainSc: 0-No pain                 [name]      "

## 2024-05-13 NOTE — Anesthesia Procedure Notes (Signed)
 Anesthesia Regional Block: Adductor canal block   Pre-Anesthetic Checklist: , timeout performed,  Correct Patient, Correct Site, Correct Laterality,  Correct Procedure, Correct Position, site marked,  Risks and benefits discussed,  Surgical consent,  Pre-op evaluation,  At surgeon's request and post-op pain management  Laterality: Left  Prep: chloraprep       Needles:  Injection technique: Single-shot  Needle Type: Echogenic Stimulator Needle     Needle Length: 9cm  Needle Gauge: 21     Additional Needles:   Procedures:,,,, ultrasound used (permanent image in chart),,    Narrative:  Start time: 05/13/2024 7:35 AM End time: 05/13/2024 7:40 AM Injection made incrementally with aspirations every 5 mL.  Performed by: Personally  Anesthesiologist: Tilford Franky BIRCH, MD  Additional Notes: Discussed risks and benefits of the nerve block in detail, including but not limited vascular injury, permanent nerve damage and infection.   Patient tolerated the procedure well. Local anesthetic introduced in an incremental fashion under minimal resistance after negative aspirations. No paresthesias were elicited. After completion of the procedure, no acute issues were identified and patient continued to be monitored by RN.

## 2024-05-13 NOTE — Anesthesia Procedure Notes (Signed)
 Spinal  Patient location during procedure: OR Start time: 05/13/2024 8:48 AM End time: 05/13/2024 8:52 AM Reason for block: surgical anesthesia  Staffing Performed: resident/CRNA  Authorized by: Tilford Franky BIRCH, MD   Performed by: Kathern Rollene LABOR, CRNA  Preanesthetic Checklist Completed: patient identified, IV checked, site marked, risks and benefits discussed, surgical consent, monitors and equipment checked, pre-op evaluation and timeout performed Spinal Block Patient position: sitting Prep: DuraPrep Patient monitoring: heart rate, cardiac monitor, continuous pulse ox and blood pressure Approach: midline Location: L3-4 Injection technique: single-shot Needle Needle type: Pencan  Needle gauge: 24 G Needle length: 9 cm Needle insertion depth (cm): 5 Assessment Sensory level: T6 Events: CSF return

## 2024-05-13 NOTE — Evaluation (Signed)
 Physical Therapy Evaluation Patient Details Name: Victoria Cooper MRN: [id number] DOB: 1956-03-03 Today's Date: 05/13/2024  History of Present Illness  Pt is a 69 year old female s/p L TKA on 05/13/24.  PMHx significant for but not limited to: back surgery, CVA  Clinical Impression  Pt is s/p TKA resulting in the deficits listed below (see PT Problem List).  Pt will benefit from acute skilled PT to increase their independence and safety with mobility to allow discharge.  Pt ambulated short distance in hallway and required seated rest break due to lightheadedness (however vitals stable and RN present).  Pt able to ambulate back to room.  Pt anticipates d/c home with spouse assist and will need to practice 4 steps in order to enter home.  Pt encouraged to use incentive spirometer and perform ankle pumps while sitting in recliner end of session.         If plan is discharge home, recommend the following:     Can travel by private vehicle        Equipment Recommendations Rolling walker (2 wheels)  Recommendations for Other Services       Functional Status Assessment Patient has had a recent decline in their functional status and demonstrates the ability to make significant improvements in function in a reasonable and predictable amount of time.     Precautions / Restrictions Precautions Precautions: Fall;Knee Restrictions Weight Bearing Restrictions Per Provider Order: No Other Position/Activity Restrictions: WBAT      Mobility  Bed Mobility Overal bed mobility: Needs Assistance Bed Mobility: Supine to Sit     Supine to sit: Contact guard     General bed mobility comments: CGA for safety; cues for technique    Transfers Overall transfer level: Needs assistance Equipment used: Rolling walker (2 wheels) Transfers: Sit to/from Stand Sit to Stand: Contact guard assist           General transfer comment: verbal cues for UE and LE positioning     Ambulation/Gait Ambulation/Gait assistance: Contact guard assist Gait Distance (Feet): 80 Feet (total) Assistive device: Rolling walker (2 wheels) Gait Pattern/deviations: Step-to pattern, Decreased stance time - left, Antalgic       General Gait Details: verbal cues for sequence, RW positioning, step length; pt reports lightheadedness and requested to sit down; recliner provided and vitals obtained: BP 137/67 mmHg with RN present and aware, pt able to ambulate back to room; SpO2 97% on room air and HR 95 bpm upon return to recliner  Stairs            Wheelchair Mobility     Tilt Bed    Modified Rankin (Stroke Patients Only)       Balance                                             Pertinent Vitals/Pain Pain Assessment Pain Assessment: 0-10 Pain Score: 2  Pain Location: left knee Pain Descriptors / Indicators: Tingling, Sore Pain Intervention(s): Repositioned, Monitored during session    Home Living Family/patient expects to be discharged to:: Private residence Living Arrangements: Spouse/significant other   Type of Home: House Home Access: Stairs to enter Entrance Stairs-Rails: Right Entrance Stairs-Number of Steps: 4   Home Layout: One level Home Equipment: None      Prior Function Prior Level of Function : Independent/Modified Independent  Extremity/Trunk Assessment        Lower Extremity Assessment Lower Extremity Assessment: LLE deficits/detail LLE Deficits / Details: unable to perform SLR, able to perform ankle pumps, observed at least 70* AROM knee flexion with pt sitting EOB       Communication   Communication Communication: No apparent difficulties    Cognition Arousal: Alert Behavior During Therapy: WFL for tasks assessed/performed   PT - Cognitive impairments: No apparent impairments                         Following commands: Intact       Cueing       General  Comments      Exercises     Assessment/Plan    PT Assessment Patient needs continued PT services  PT Problem List Pain;Decreased strength;Decreased range of motion;Decreased knowledge of use of DME;Decreased activity tolerance;Decreased mobility       PT Treatment Interventions Stair training;Gait training;DME instruction;Therapeutic exercise;Functional mobility training;Therapeutic activities;Patient/family education    PT Goals (Current goals can be found in the Care Plan section)  Acute Rehab PT Goals PT Goal Formulation: With patient Time For Goal Achievement: 05/19/24 Potential to Achieve Goals: Good    Frequency 7X/week     Co-evaluation               AM-PAC PT 6 Clicks Mobility  Outcome Measure Help needed turning from your back to your side while in a flat bed without using bedrails?: A Little Help needed moving from lying on your back to sitting on the side of a flat bed without using bedrails?: A Little Help needed moving to and from a bed to a chair (including a wheelchair)?: A Little Help needed standing up from a chair using your arms (e.g., wheelchair or bedside chair)?: A Little Help needed to walk in hospital room?: A Little Help needed climbing 3-5 steps with a railing? : A Lot 6 Click Score: 17    End of Session Equipment Utilized During Treatment: Gait belt Activity Tolerance: Patient tolerated treatment well Patient left: in chair;with call bell/phone within reach;with chair alarm set Nurse Communication: Mobility status PT Visit Diagnosis: Difficulty in walking, not elsewhere classified (R26.2)    Time: 8584-8562 PT Time Calculation (min) (ACUTE ONLY): 22 min   Charges:   PT Evaluation $PT Eval Low Complexity: 1 Low   PT General Charges $$ ACUTE PT VISIT: 1 Visit       Tari KLEIN, DPT Physical Therapist Acute Rehabilitation Services Office: [phone]   Tari CROME [place] 05/13/2024, 2:52 PM

## 2024-05-14 ENCOUNTER — Encounter (HOSPITAL_COMMUNITY): Payer: Self-pay | Admitting: Orthopedic Surgery

## 2024-05-14 ENCOUNTER — Other Ambulatory Visit (HOSPITAL_COMMUNITY): Payer: Self-pay

## 2024-05-14 DIAGNOSIS — M1712 Unilateral primary osteoarthritis, left knee: Secondary | ICD-10-CM | POA: Diagnosis not present

## 2024-05-14 LAB — BASIC METABOLIC PANEL WITH GFR
Anion gap: 9 (ref 5–15)
BUN: 11 mg/dL (ref 8–23)
CO2: 23 mmol/L (ref 22–32)
Calcium: 8.8 mg/dL — ABNORMAL LOW (ref 8.9–10.3)
Chloride: 103 mmol/L (ref 98–111)
Creatinine, Ser: 0.56 mg/dL (ref 0.44–1.00)
GFR, Estimated: >60 mL/min
Glucose, Bld: 146 mg/dL — ABNORMAL HIGH (ref 70–99)
Potassium: 3.8 mmol/L (ref 3.5–5.1)
Sodium: 134 mmol/L — ABNORMAL LOW (ref 135–145)

## 2024-05-14 LAB — CBC
HCT: 35.2 % — ABNORMAL LOW (ref 36.0–46.0)
Hemoglobin: 11.9 g/dL — ABNORMAL LOW (ref 12.0–15.0)
MCH: 31.4 pg (ref 26.0–34.0)
MCHC: 33.8 g/dL (ref 30.0–36.0)
MCV: 92.9 fL (ref 80.0–100.0)
Platelets: 220 K/uL (ref 150–400)
RBC: 3.79 MIL/uL — ABNORMAL LOW (ref 3.87–5.11)
RDW: 12.2 % (ref 11.5–15.5)
WBC: 14.7 K/uL — ABNORMAL HIGH (ref 4.0–10.5)
nRBC: 0 % (ref 0.0–0.2)

## 2024-05-14 MED ORDER — POLYETHYLENE GLYCOL 3350 17 GM/SCOOP PO POWD
17.0000 g | Freq: Two times a day (BID) | ORAL | 0 refills | Status: AC
  Filled 2024-05-14: qty 238, 7d supply, fill #0

## 2024-05-14 MED ORDER — SENNA 8.6 MG PO TABS
2.0000 | ORAL_TABLET | Freq: Every day | ORAL | 0 refills | Status: AC
  Filled 2024-05-14: qty 28, 14d supply, fill #0

## 2024-05-14 MED ORDER — ACETAMINOPHEN 500 MG PO TABS: 1000.0000 mg | ORAL_TABLET | Freq: Four times a day (QID) | ORAL | Status: AC

## 2024-05-14 MED ORDER — OXYCODONE HCL 5 MG PO TABS
5.0000 mg | ORAL_TABLET | ORAL | 0 refills | Status: DC | PRN
  Filled 2024-05-14: qty 42, 7d supply, fill #0

## 2024-05-14 MED ORDER — ASPIRIN 81 MG PO CHEW
81.0000 mg | CHEWABLE_TABLET | Freq: Two times a day (BID) | ORAL | 0 refills | Status: AC
  Filled 2024-05-14: qty 56, 28d supply, fill #0

## 2024-05-14 MED ORDER — CYCLOBENZAPRINE HCL 5 MG PO TABS
5.0000 mg | ORAL_TABLET | Freq: Three times a day (TID) | ORAL | 0 refills | Status: AC | PRN
  Filled 2024-05-14: qty 40, 7d supply, fill #0

## 2024-05-14 NOTE — Plan of Care (Signed)
  Problem: Pain Management: Goal: Pain level will decrease with appropriate interventions Outcome: Progressing   Problem: Clinical Measurements: Goal: Postoperative complications will be avoided or minimized Outcome: Progressing   Problem: Activity: Goal: Range of joint motion will improve Outcome: Progressing   Problem: Safety: Goal: Ability to remain free from injury will improve Outcome: Progressing

## 2024-05-14 NOTE — Progress Notes (Signed)
 "  Subjective: 1 Day Post-Op Procedures (LRB): ARTHROPLASTY, KNEE, TOTAL (Left) Patient reports pain as mild.   Patient seen in rounds with Dr. Ernie. Patient is well, and has had no acute complaints or problems. No acute events overnight. Foley catheter removed. Patient ambulated a few feet with PT. She did get lightheaded when up yesterday. We will start therapy today.   Objective: Vital signs in last 24 hours: Temp:  [97 F (36.1 C)-98.4 F (36.9 C)] 98.1 F (36.7 C) (01/16 0556) Pulse Rate:  [68-94] 68 (01/16 0556) Resp:  [10-20] 18 (01/16 0556) BP: (112-170)/(61-109) 122/62 (01/16 0556) SpO2:  [92 %-100 %] 97 % (01/16 0556)  Intake/Output from previous day:  Intake/Output Summary (Last 24 hours) at 05/14/2024 0743 Last data filed at 05/14/2024 9386 Gross per 24 hour  Intake 3816.67 ml  Output 3025 ml  Net 791.67 ml     Intake/Output this shift: No intake/output data recorded.  Labs: Recent Labs    05/14/24 0311  HGB 11.9*   Recent Labs    05/14/24 0311  WBC 14.7*  RBC 3.79*  HCT 35.2*  PLT 220   Recent Labs    05/14/24 0311  NA 134*  K 3.8  CL 103  CO2 23  BUN 11  CREATININE 0.56  GLUCOSE 146*  CALCIUM  8.8*   No results for input(s): LABPT, INR in the last 72 hours.  Exam: General - Patient is Alert and Oriented Extremity - Neurologically intact Sensation intact distally Intact pulses distally Dorsiflexion/Plantar flexion intact Dressing - dressing C/D/I Motor Function - intact, moving foot and toes well on exam.   Past Medical History:  Diagnosis Date   Abnormal Pap smear of cervix 2006   Repeat Pap normal   Allergy    Animal bite 03/18/2011   Dog Bite   Arthritis    Asthma    Classical migraine with intractable migraine 11/30/2015   Complication of anesthesia    passed out after back surgery   Connective tissue disease, undifferentiated 2015   Dr. Mai   CVA (cerebral vascular accident) [facility])    Female hypogonadism  syndrome 01/13/2019   GERD (gastroesophageal reflux disease)    Hyperlipidemia    Hypertension    Migraine    Skin cancer    Vitamin D deficiency disease 01/13/2019   Wears glasses     Assessment/Plan: 1 Day Post-Op Procedures (LRB): ARTHROPLASTY, KNEE, TOTAL (Left) Principal Problem:   S/P total knee replacement, left Active Problems:   S/P total knee arthroplasty, left  Estimated body mass index is 30.18 kg/m as calculated from the following:   Height as of this encounter: 5' 2 (1.575 m).   Weight as of this encounter: 74.8 kg. Advance diet Up with therapy D/C IV fluids   Patient's anticipated LOS is less than 2 midnights, meeting these requirements: - Younger than 31 - Lives within 1 hour of care - Has a competent adult at home to recover with post-op recover - NO history of  - Chronic pain requiring opiods  - Diabetes  - Coronary Artery Disease  - Heart failure  - Heart attack  - Stroke  - DVT/VTE  - Cardiac arrhythmia  - Respiratory Failure/COPD  - Renal failure  - Anemia  - Advanced Liver disease     DVT Prophylaxis - Aspirin  Weight bearing as tolerated.  Hgb stable at 11.9 this AM BP stable at 122/62  Plan is to go Home after hospital stay. Plan for discharge today following 1-2  sessions of PT as long as they are meeting their goals. Patient is scheduled for OPPT. Follow up in the office in 2 weeks.   Rosina Calin, PA-C Orthopedic Surgery [phone] 05/14/2024, 7:43 AM  "

## 2024-05-14 NOTE — TOC Transition Note (Signed)
 Transition of Care [facility]) - Discharge Note   Patient Details  Name: Victoria Cooper MRN: [id number] Date of Birth: 1955/06/20  [facility] CM/SW Contact:  NORMAN ASPEN, LCSW Phone Number: 05/14/2024, 9:33 AM   Clinical Narrative:     Met with pt and confirming she has received RW to room via [employer].  OPPT already arranged with [facility] ([place]).  Hospital follow up appointment with PCP office secured for 05/19/24 @ 2:45pm with Izetta Cork, NP.  No further IP CM needs.  Final next level of care: OP Rehab Barriers to Discharge: No Barriers Identified   Patient Goals and CMS Choice Patient states their goals for this hospitalization and ongoing recovery are:: return home          Discharge Placement                       Discharge Plan and Services Additional resources added to the After Visit Summary for                  DME Arranged: Walker rolling DME Agency: [employer]                  Social Drivers of Health (SDOH) Interventions SDOH Screenings   Food Insecurity: No Food Insecurity (05/13/2024)  Housing: Low Risk (05/13/2024)  Transportation Needs: No Transportation Needs (05/13/2024)  Utilities: Not At Risk (05/13/2024)  Financial Resource Strain: Low Risk  (03/10/2024)   Received from [facility]  Social Connections: Socially Integrated (05/13/2024)  Tobacco Use: Low Risk (05/13/2024)     Readmission Risk Interventions     No data to display

## 2024-05-14 NOTE — Progress Notes (Signed)
 Discharge meds in a secure bag delivered to patient by this RN

## 2024-05-14 NOTE — Progress Notes (Signed)
 Physical Therapy Treatment Patient Details Name: Victoria Cooper MRN: [id number] DOB: July 25, 1955 Today's Date: 05/14/2024   History of Present Illness Pt is a 69 year old female s/p L TKA on 05/13/24.  PMHx significant for but not limited to: back surgery, CVA    PT Comments  Pt performed LE exercises, ambulated in hallway and practiced safe stair technique.  Pt provided with HEP and stair handouts.  Pt had no further questions and ready for d/c home today.     If plan is discharge home, recommend the following:     Can travel by private vehicle        Equipment Recommendations  Rolling walker (2 wheels)    Recommendations for Other Services       Precautions / Restrictions Precautions Precautions: Fall;Knee Recall of Precautions/Restrictions: Intact Restrictions Weight Bearing Restrictions Per Provider Order: No Other Position/Activity Restrictions: WBAT     Mobility  Bed Mobility Overal bed mobility: Needs Assistance Bed Mobility: Supine to Sit     Supine to sit: Supervision     General bed mobility comments: cues for technique    Transfers Overall transfer level: Needs assistance Equipment used: Rolling walker (2 wheels) Transfers: Sit to/from Stand Sit to Stand: Contact guard assist           General transfer comment: verbal cues for UE and LE positioning    Ambulation/Gait Ambulation/Gait assistance: Contact guard assist Gait Distance (Feet): 240 Feet Assistive device: Rolling walker (2 wheels) Gait Pattern/deviations: Step-to pattern, Decreased stance time - left, Antalgic       General Gait Details: verbal cues for sequence, RW positioning, step length; no symptoms reported today   Stairs Stairs: Yes Stairs assistance: Contact guard assist Stair Management: Step to pattern, Backwards, With walker Number of Stairs: 4 General stair comments: verbal cues for sequence and safety; pt performed twice and reports understanding; provided  handout on technique   Wheelchair Mobility     Tilt Bed    Modified Rankin (Stroke Patients Only)       Balance                                            Communication Communication Communication: No apparent difficulties  Cognition Arousal: Alert Behavior During Therapy: WFL for tasks assessed/performed   PT - Cognitive impairments: No apparent impairments                         Following commands: Intact      Cueing    Exercises Total Joint Exercises Ankle Circles/Pumps: AROM, Both, 10 reps Quad Sets: AROM, Both, 10 reps Heel Slides: AAROM, Left, 10 reps Hip ABduction/ADduction: AAROM, Left, 10 reps Straight Leg Raises: 10 reps, Left, AAROM    General Comments        Pertinent Vitals/Pain Pain Assessment Pain Assessment: 0-10 Pain Score: 4  Pain Location: left knee Pain Descriptors / Indicators: Sore, Aching Pain Intervention(s): Monitored during session, Repositioned    Home Living                          Prior Function            PT Goals (current goals can now be found in the care plan section) Progress towards PT goals: Progressing toward goals    Frequency  7X/week      PT Plan      Co-evaluation              AM-PAC PT 6 Clicks Mobility   Outcome Measure  Help needed turning from your back to your side while in a flat bed without using bedrails?: A Little Help needed moving from lying on your back to sitting on the side of a flat bed without using bedrails?: A Little Help needed moving to and from a bed to a chair (including a wheelchair)?: A Little Help needed standing up from a chair using your arms (e.g., wheelchair or bedside chair)?: A Little Help needed to walk in hospital room?: A Little Help needed climbing 3-5 steps with a railing? : A Little 6 Click Score: 18    End of Session Equipment Utilized During Treatment: Gait belt Activity Tolerance: Patient tolerated  treatment well Patient left: in chair;with call bell/phone within reach;with chair alarm set;with nursing/sitter in room Nurse Communication: Mobility status PT Visit Diagnosis: Difficulty in walking, not elsewhere classified (R26.2)     Time: 8993-8965 PT Time Calculation (min) (ACUTE ONLY): 28 min  Charges:    $Gait Training: 8-22 mins $Therapeutic Exercise: 8-22 mins PT General Charges $$ ACUTE PT VISIT: 1 Visit                    Tari KLEIN, DPT Physical Therapist Acute Rehabilitation Services Office: [phone]    Tari CROME [place] 05/14/2024, 12:10 PM

## 2024-05-14 NOTE — Progress Notes (Signed)
 AVS gone over with pt. CHG was not ordered for pt. MD messaged to ask about it. All other  questions answered. IV removed, walker delivered to room.   Waiting on response from MD.

## 2024-05-19 ENCOUNTER — Other Ambulatory Visit: Payer: Self-pay

## 2024-05-19 ENCOUNTER — Ambulatory Visit (HOSPITAL_COMMUNITY): Attending: Orthopedic Surgery

## 2024-05-19 DIAGNOSIS — R262 Difficulty in walking, not elsewhere classified: Secondary | ICD-10-CM | POA: Diagnosis present

## 2024-05-19 DIAGNOSIS — M25562 Pain in left knee: Secondary | ICD-10-CM | POA: Insufficient documentation

## 2024-05-19 DIAGNOSIS — M25662 Stiffness of left knee, not elsewhere classified: Secondary | ICD-10-CM | POA: Diagnosis present

## 2024-05-19 DIAGNOSIS — M1712 Unilateral primary osteoarthritis, left knee: Secondary | ICD-10-CM | POA: Insufficient documentation

## 2024-05-19 NOTE — Therapy (Signed)
 " OUTPATIENT PHYSICAL THERAPY LOWER EXTREMITY EVALUATION   Patient Name: Victoria Cooper MRN: [id number] DOB:1955-12-21, 69 y.o., female Today's Date: 05/19/2024  END OF SESSION:  PT End of Session - 05/19/24 0904     Visit Number 1    Number of Visits 16    Date for Recertification  07/15/24    Authorization Type Medicare    Authorization Time Period no auth    Progress Note Due on Visit 10    PT Start Time 0903    PT Stop Time 0943    PT Time Calculation (min) 40 min    Activity Tolerance Patient tolerated treatment well    Behavior During Therapy [facility] for tasks assessed/performed          Past Medical History:  Diagnosis Date   Abnormal Pap smear of cervix 2006   Repeat Pap normal   Allergy    Animal bite 03/18/2011   Dog Bite   Arthritis    Asthma    Classical migraine with intractable migraine 11/30/2015   Complication of anesthesia    passed out after back surgery   Connective tissue disease, undifferentiated 2015   Dr. Mai   CVA (cerebral vascular accident) [facility])    Female hypogonadism syndrome 01/13/2019   GERD (gastroesophageal reflux disease)    Hyperlipidemia    Hypertension    Migraine    Skin cancer    Vitamin D deficiency disease 01/13/2019   Wears glasses    Past Surgical History:  Procedure Laterality Date   BACK SURGERY  2009   lumb lam-fusion   Basil cell     BREAST REDUCTION SURGERY  05/26/2012   Procedure: MAMMARY REDUCTION  (BREAST);  Surgeon: Kayla Pray, MD;  Location: [facility];  Service: Plastics;  Laterality: Bilateral;   COLONOSCOPY  2003, 2018   diverticulosis   TOTAL KNEE ARTHROPLASTY Left 05/13/2024   Procedure: ARTHROPLASTY, KNEE, TOTAL;  Surgeon: Ernie Cough, MD;  Location: [facility];  Service: Orthopedics;  Laterality: Left;   VAGINAL HYSTERECTOMY  2003   hyst   Patient Active Problem List   Diagnosis Date Noted   S/P total knee arthroplasty, left 05/13/2024   S/P total knee replacement, left  05/13/2024   Vaginal atrophy 05/05/2023   Incontinence of feces 05/05/2023   Mixed stress and urge urinary incontinence 05/05/2023   Pelvic pain 05/05/2023   Pelvic organ prolapse quantification stage 2 cystocele 05/05/2023   Female hypogonadism syndrome 01/13/2019   Vitamin D deficiency disease 01/13/2019   Classical migraine with intractable migraine 11/30/2015   Upper airway cough syndrome 04/07/2013   Essential hypertension, benign 04/07/2013   Extrinsic asthma 03/05/2013   Animal bite 03/18/2011    ERE:Eyjmm, Ryan MD   REFERRING PROVIDER: Ernie Cough, MD  REFERRING DIAG: [phone] (ICD-10-CM) - Presence of left artificial knee joint Z47.1 (ICD-10-CM) - Aftercare following joint replacement surgery  THERAPY DIAG:  Acute pain of left knee - Plan: PT plan of care cert/re-cert  Stiffness of left knee, not elsewhere classified - Plan: PT plan of care cert/re-cert  Difficulty in walking, not elsewhere classified - Plan: PT plan of care cert/re-cert  Unilateral primary osteoarthritis, left knee - Plan: PT plan of care cert/re-cert  Rationale for Evaluation and Treatment: Rehabilitation  ONSET DATE: 05/13/2024  SUBJECTIVE:   SUBJECTIVE STATEMENT: S/p left knee TKA 05/13/24; stayed overnight.  Had PT the next day. Nerve block left leg.  Left knee was just worn out and getting injections since 2019.  Last year went on a trip and did a lot of hiking and knee gave out; ready for total knee.    PERTINENT HISTORY: Lumbar fusion about 20 years ago PAIN:  Are you having pain? Yes: NPRS scale: 7/10 Pain location: left knee Pain description: real sore and aching Aggravating factors: up and moving Relieving factors: pain meds, ice machine  PRECAUTIONS: None   WEIGHT BEARING RESTRICTIONS: No  FALLS:  Has patient fallen in last 6 months? No  LIVING ENVIRONMENT: Lives with: lives with their spouse Lives in: House/apartment Stairs: Yes: External: 3-4 steps; on left going  up Has following equipment at home: Single point cane and Walker - 2 wheeled  OCCUPATION: retired  PLOF: Independent  PATIENT GOALS: to get my ROM back and to get back to optimal place  NEXT MD VISIT: 05/26/24  OBJECTIVE:  Note: Objective measures were completed at Evaluation unless otherwise noted.  DIAGNOSTIC FINDINGS:   PATIENT SURVEYS:  LEFS  Extreme difficulty/unable (0), Quite a bit of difficulty (1), Moderate difficulty (2), Little difficulty (3), No difficulty (4) Survey date:    Any of your usual work, housework or school activities   2. Usual hobbies, recreational or sporting activities   3. Getting into/out of the bath   4. Walking between rooms   5. Putting on socks/shoes   6. Squatting    7. Lifting an object, like a bag of groceries from the floor   8. Performing light activities around your home   9. Performing heavy activities around your home   10. Getting into/out of a car   11. Walking 2 blocks   12. Walking 1 mile   13. Going up/down 10 stairs (1 flight)   14. Standing for 1 hour   15.  sitting for 1 hour   16. Running on even ground   17. Running on uneven ground   18. Making sharp turns while running fast   19. Hopping    20. Rolling over in bed   Score total:  15/80; 18.8%     COGNITION: Overall cognitive status: Within functional limits for tasks assessed     SENSATION: WFL  EDEMA:  Normal for this time s/p   POSTURE: flexed trunk   PALPATION: General soreness normal for this time s/p  LOWER EXTREMITY ROM:  Active ROM Right eval Left eval  Hip flexion    Hip extension    Hip abduction    Hip adduction    Hip internal rotation    Hip external rotation    Knee flexion  92  Knee extension -2 -8  Ankle dorsiflexion    Ankle plantarflexion    Ankle inversion    Ankle eversion     (Blank rows = not tested)  LOWER EXTREMITY MMT:  MMT Right eval Left eval  Hip flexion    Hip extension    Hip abduction    Hip adduction     Hip internal rotation    Hip external rotation    Knee flexion  2  Knee extension  2  Ankle dorsiflexion  4  Ankle plantarflexion    Ankle inversion    Ankle eversion     (Blank rows = not tested)   FUNCTIONAL TESTS:  5 times sit to stand: 30.47 sec using hands to push up to standing 2 minute walk test: 129 ft with RW  GAIT: Distance walked: 129 ft with RW Assistive device utilized: [employer] - 2 wheeled Level of assistance: SBA Comments:  slow antalgic gait                                                                                                                                TREATMENT DATE: 05/19/24 physical therapy evaluation and HEP instruction    PATIENT EDUCATION:  Education details: Patient educated on exam findings, POC, scope of PT, HEP, and what to expect next visit. Person educated: Patient Education method: Explanation, Demonstration, and Handouts Education comprehension: verbalized understanding, returned demonstration, verbal cues required, and tactile cues required  HOME EXERCISE PROGRAM: Access Code: [email or web address] URL: [email or web address] Date: 05/19/2024 Prepared by: [facility] - Rehab  Exercises - Supine Ankle Pumps  - 1 x daily - 7 x weekly - 3 sets - 15 reps - Supine Quadricep Sets  - 1 x daily - 7 x weekly - 3 sets - 15 reps - Supine Short Arc Quad  - 1 x daily - 7 x weekly - 3 sets - 15 reps - Supine Heel Slides  - 1 x daily - 7 x weekly - 3 sets - 15 reps - Seated Long Arc Quad  - 1 x daily - 7 x weekly - 3 sets - 15 reps - Seated Knee Flexion Stretch  - 1 x daily - 7 x weekly - 3 sets - 15 reps  ASSESSMENT:  CLINICAL IMPRESSION: Patient is a 69 y.o. female who was seen today for physical therapy evaluation and treatment for Z96.652 (ICD-10-CM) - Presence of left artificial knee joint Z47.1 (ICD-10-CM) - Aftercare following joint replacement surgery. Patient demonstrates muscle weakness, reduced ROM, and fascial restrictions which are  likely contributing to symptoms of pain and are negatively impacting patient ability to perform ADLs and functional mobility tasks. Patient will benefit from skilled physical therapy services to address these deficits to reduce pain and improve level of function with ADLs and functional mobility tasks.   OBJECTIVE IMPAIRMENTS: Abnormal gait, decreased activity tolerance, decreased balance, decreased mobility, difficulty walking, decreased ROM, decreased strength, increased edema, increased fascial restrictions, impaired perceived functional ability, and pain.   ACTIVITY LIMITATIONS: bending, sitting, standing, squatting, sleeping, stairs, transfers, bed mobility, and locomotion level  PARTICIPATION LIMITATIONS: meal prep, cleaning, laundry, driving, shopping, and community activity   REHAB POTENTIAL: Good  CLINICAL DECISION MAKING: Evolving/moderate complexity  EVALUATION COMPLEXITY: Moderate   GOALS: Goals reviewed with patient? No  SHORT TERM GOALS: Target date: 06/17/2024 patient will be independent with initial HEP and compliant with HEP 3-4 times a week   Baseline: Goal status: INITIAL  2.  Patient will report 50% improvement overall  Baseline:  Goal status: INITIAL  3.  Patient will increase left knee mobility to -5 to 105 to promote normal navigation of steps; step over step pattern  Baseline: see above Goal status: INITIAL   LONG TERM GOALS: Target date: 07/15/2024  Patient will be independent in self management strategies to improve quality of life and functional outcomes.  Baseline:  Goal status:  INITIAL  2.  Patient will report 70% improvement overall  Baseline:  Goal status: INITIAL  3.  Patient will increase left knee mobility to -2 to 120 to promote normal navigation of steps; step over step pattern  Baseline: see above Goal status: INITIAL  4.  Patient will improve 5 times sit to stand score to 15 sec or less to demonstrate improved functional  mobility and increased leg strength.     Baseline:  Goal status: INITIAL  5.  Patient will increase distance on 2 MWT to 300 ft or more with LRAD to demonstrate improved speed and efficiency with household and community ambulation  Baseline: 129 FT with RW Goal status: INITIAL  6.  Patient will improve LEFS score by 25 points to demonstrate improved perceived function  Baseline: 15/80 Goal status: INITIAL  7.   Patient will increase right leg MMT's to 5/5 to allow navigation of steps without gait deviation or loss of balance   Baseline: see above  Goal status: INITIAL   PLAN:  PT FREQUENCY: 2x/week  PT DURATION: 8 weeks  PLANNED INTERVENTIONS: [id number]- PT Re-evaluation, 97110-Therapeutic exercises, 97530- Therapeutic activity, 97112- Neuromuscular re-education, 97535- Self Care, [id number]- Manual therapy, [id number]- Gait training, [phone]- Orthotic Fit/training, [phone]- Canalith repositioning, [id number]- Aquatic Therapy, 97760- Splinting, 97597- Wound care (first 20 sq cm), 97598- Wound care (each additional 20 sq cm)Patient/Family education, Balance training, Stair training, Taping, Dry Needling, Joint mobilization, Joint manipulation, Spinal manipulation, Spinal mobilization, Scar mobilization, and DME instructions.   PLAN FOR NEXT SESSION: Review HEP and goals ; left knee mobility then strength; gait training; manual as appropriate; sees MD next week for surgical follow up; waterproof dressing still in place at eval  10:00 AM, 05/19/24 Angela Vazguez Small Soleil Mas MPT [facility] [place] [phone] Ph:[phone]  "

## 2024-05-21 ENCOUNTER — Ambulatory Visit (HOSPITAL_COMMUNITY)

## 2024-05-21 DIAGNOSIS — M25662 Stiffness of left knee, not elsewhere classified: Secondary | ICD-10-CM

## 2024-05-21 DIAGNOSIS — M1712 Unilateral primary osteoarthritis, left knee: Secondary | ICD-10-CM

## 2024-05-21 DIAGNOSIS — M25562 Pain in left knee: Secondary | ICD-10-CM | POA: Diagnosis not present

## 2024-05-21 DIAGNOSIS — R262 Difficulty in walking, not elsewhere classified: Secondary | ICD-10-CM

## 2024-05-21 NOTE — Therapy (Signed)
 " OUTPATIENT PHYSICAL THERAPY LOWER EXTREMITY TREATMENT   Patient Name: Victoria Cooper MRN: [id number] DOB:Oct 09, 1955, 69 y.o., female Today's Date: 05/21/2024  END OF SESSION:  PT End of Session - 05/21/24 0949     Visit Number 2    Number of Visits 16    Date for Recertification  07/15/24    Authorization Type Medicare    Authorization Time Period no auth    Progress Note Due on Visit 10    PT Start Time 0950    PT Stop Time 1028    PT Time Calculation (min) 38 min    Equipment Utilized During Treatment Gait belt    Activity Tolerance Patient tolerated treatment well    Behavior During Therapy [facility] for tasks assessed/performed           Past Medical History:  Diagnosis Date   Abnormal Pap smear of cervix 2006   Repeat Pap normal   Allergy    Animal bite 03/18/2011   Dog Bite   Arthritis    Asthma    Classical migraine with intractable migraine 11/30/2015   Complication of anesthesia    passed out after back surgery   Connective tissue disease, undifferentiated 2015   Dr. Mai   CVA (cerebral vascular accident) [facility])    Female hypogonadism syndrome 01/13/2019   GERD (gastroesophageal reflux disease)    Hyperlipidemia    Hypertension    Migraine    Skin cancer    Vitamin D deficiency disease 01/13/2019   Wears glasses    Past Surgical History:  Procedure Laterality Date   BACK SURGERY  2009   lumb lam-fusion   Basil cell     BREAST REDUCTION SURGERY  05/26/2012   Procedure: MAMMARY REDUCTION  (BREAST);  Surgeon: Kayla Pray, MD;  Location: [facility];  Service: Plastics;  Laterality: Bilateral;   COLONOSCOPY  2003, 2018   diverticulosis   TOTAL KNEE ARTHROPLASTY Left 05/13/2024   Procedure: ARTHROPLASTY, KNEE, TOTAL;  Surgeon: Ernie Cough, MD;  Location: [facility];  Service: Orthopedics;  Laterality: Left;   VAGINAL HYSTERECTOMY  2003   hyst   Patient Active Problem List   Diagnosis Date Noted   S/P total knee arthroplasty,  left 05/13/2024   S/P total knee replacement, left 05/13/2024   Vaginal atrophy 05/05/2023   Incontinence of feces 05/05/2023   Mixed stress and urge urinary incontinence 05/05/2023   Pelvic pain 05/05/2023   Pelvic organ prolapse quantification stage 2 cystocele 05/05/2023   Female hypogonadism syndrome 01/13/2019   Vitamin D deficiency disease 01/13/2019   Classical migraine with intractable migraine 11/30/2015   Upper airway cough syndrome 04/07/2013   Essential hypertension, benign 04/07/2013   Extrinsic asthma 03/05/2013   Animal bite 03/18/2011    ERE:Eyjmm, Ryan MD   REFERRING PROVIDER: Ernie Cough, MD  REFERRING DIAG: [phone] (ICD-10-CM) - Presence of left artificial knee joint Z47.1 (ICD-10-CM) - Aftercare following joint replacement surgery  THERAPY DIAG:  Acute pain of left knee  Stiffness of left knee, not elsewhere classified  Difficulty in walking, not elsewhere classified  Unilateral primary osteoarthritis, left knee  Rationale for Evaluation and Treatment: Rehabilitation  ONSET DATE: 05/13/2024  SUBJECTIVE:   SUBJECTIVE STATEMENT: Pt states left knee is still swollen and is still about a 7/10 today. Pt states she was feeling good yesterday and did a lot of walking and may have over done it. Pt states follow up appointment is next Wednesday.  Eval: S/p left knee TKA 05/13/24;  stayed overnight.  Had PT the next day. Nerve block left leg.  Left knee was just worn out and getting injections since 2019.  Last year went on a trip and did a lot of hiking and knee gave out; ready for total knee.    PERTINENT HISTORY: Lumbar fusion about 20 years ago PAIN:  Are you having pain? Yes: NPRS scale: 7/10 Pain location: left knee Pain description: real sore and aching Aggravating factors: up and moving Relieving factors: pain meds, ice machine  PRECAUTIONS: None   WEIGHT BEARING RESTRICTIONS: No  FALLS:  Has patient fallen in last 6 months? No  LIVING  ENVIRONMENT: Lives with: lives with their spouse Lives in: House/apartment Stairs: Yes: External: 3-4 steps; on left going up Has following equipment at home: Single point cane and Walker - 2 wheeled  OCCUPATION: retired  PLOF: Independent  PATIENT GOALS: to get my ROM back and to get back to optimal place  NEXT MD VISIT: 05/26/24  OBJECTIVE:  Note: Objective measures were completed at Evaluation unless otherwise noted.  DIAGNOSTIC FINDINGS:   PATIENT SURVEYS:  LEFS  Extreme difficulty/unable (0), Quite a bit of difficulty (1), Moderate difficulty (2), Little difficulty (3), No difficulty (4) Survey date:    Any of your usual work, housework or school activities   2. Usual hobbies, recreational or sporting activities   3. Getting into/out of the bath   4. Walking between rooms   5. Putting on socks/shoes   6. Squatting    7. Lifting an object, like a bag of groceries from the floor   8. Performing light activities around your home   9. Performing heavy activities around your home   10. Getting into/out of a car   11. Walking 2 blocks   12. Walking 1 mile   13. Going up/down 10 stairs (1 flight)   14. Standing for 1 hour   15.  sitting for 1 hour   16. Running on even ground   17. Running on uneven ground   18. Making sharp turns while running fast   19. Hopping    20. Rolling over in bed   Score total:  15/80; 18.8%     COGNITION: Overall cognitive status: Within functional limits for tasks assessed     SENSATION: WFL  EDEMA:  Normal for this time s/p   POSTURE: flexed trunk   PALPATION: General soreness normal for this time s/p  LOWER EXTREMITY ROM:  Active ROM Right eval Left eval  Hip flexion    Hip extension    Hip abduction    Hip adduction    Hip internal rotation    Hip external rotation    Knee flexion  92  Knee extension -2 -8  Ankle dorsiflexion    Ankle plantarflexion    Ankle inversion    Ankle eversion     (Blank rows = not  tested)  LOWER EXTREMITY MMT:  MMT Right eval Left eval  Hip flexion    Hip extension    Hip abduction    Hip adduction    Hip internal rotation    Hip external rotation    Knee flexion  2  Knee extension  2  Ankle dorsiflexion  4  Ankle plantarflexion    Ankle inversion    Ankle eversion     (Blank rows = not tested)   FUNCTIONAL TESTS:  5 times sit to stand: 30.47 sec using hands to push up to standing 2 minute walk  test: 129 ft with RW  GAIT: Distance walked: 129 ft with RW Assistive device utilized: [employer]ant - 2 wheeled Level of assistance: SBA Comments: slow antalgic gait                                                                                                                                TREATMENT DATE:  05/21/2024  Manual Therapy: -L PROM and OP mobilizations for extension, 4 degrees from neutral obtained Therapeutic Exercise: -Stationary bike full, 5 minutes, seat 14, pt cued for 60 SPM -Heel slides, 3 sets of 10 reps, green strap for OP, pt cued for max ROM (104 degrees of flexion -Heel raises, 1 set of 20 reps, pt cued for one UE only -Knee drive and extension stretch on 12 inch step, 1 set of 7 reps, 5 second holds each direction, pt cued for OP with UE for extension and bodyweight for flexion (106 degrees) Neuromuscular Reeducation:  -Quad sets, 2 sets of 10 reps, cued for 5 second holds and external cue of towel roll used Therapeutic Activity: -Step up and overs, 4 inch step, 1 set of 5 reps bilaterally, pt cued for LLE dominance sequencing -Lateral step up and overs, 4 inch step, 1 sets of 5 reps, bilaterally, pt cued for decreased UE support and increased step length for foot landing clearance   05/19/24 physical therapy evaluation and HEP instruction    PATIENT EDUCATION:  Education details: Patient educated on exam findings, POC, scope of PT, HEP, and what to expect next visit. Person educated: Patient Education method: Explanation,  Demonstration, and Handouts Education comprehension: verbalized understanding, returned demonstration, verbal cues required, and tactile cues required  HOME EXERCISE PROGRAM: Access Code: [email or web address] URL: [email or web address] Date: 05/19/2024 Prepared by: [facility] - Rehab  Exercises - Supine Ankle Pumps  - 1 x daily - 7 x weekly - 3 sets - 15 reps - Supine Quadricep Sets  - 1 x daily - 7 x weekly - 3 sets - 15 reps - Supine Short Arc Quad  - 1 x daily - 7 x weekly - 3 sets - 15 reps - Supine Heel Slides  - 1 x daily - 7 x weekly - 3 sets - 15 reps - Seated Long Arc Quad  - 1 x daily - 7 x weekly - 3 sets - 15 reps - Seated Knee Flexion Stretch  - 1 x daily - 7 x weekly - 3 sets - 15 reps  ASSESSMENT:  CLINICAL IMPRESSION: Patient continues to demonstrate increased pain in left knee, decreased LLE strength, decreased gait quality and balance. Patient also demonstrates increased activity tolerance with WB this date with fair standing endurance during today's session. Patient able to progress dynamic balance and left knee activation exercises today with step up variations, good performance with verbal cueing. Patient educated on importance of consistent HEP compliance and progression of left knee ROM and then strengthening. Patient would continue to benefit from skilled  physical therapy for decreased LLE pain, increased endurance with ambulation, increased LLE strength/ROM, and improved balance for improved quality of life, improved independence with community ambulation and continued progress towards therapy goals.    Eval: Patient is a 69 y.o. female who was seen today for physical therapy evaluation and treatment for Z96.652 (ICD-10-CM) - Presence of left artificial knee joint Z47.1 (ICD-10-CM) - Aftercare following joint replacement surgery. Patient demonstrates muscle weakness, reduced ROM, and fascial restrictions which are likely contributing to symptoms of pain and are negatively  impacting patient ability to perform ADLs and functional mobility tasks. Patient will benefit from skilled physical therapy services to address these deficits to reduce pain and improve level of function with ADLs and functional mobility tasks.   OBJECTIVE IMPAIRMENTS: Abnormal gait, decreased activity tolerance, decreased balance, decreased mobility, difficulty walking, decreased ROM, decreased strength, increased edema, increased fascial restrictions, impaired perceived functional ability, and pain.   ACTIVITY LIMITATIONS: bending, sitting, standing, squatting, sleeping, stairs, transfers, bed mobility, and locomotion level  PARTICIPATION LIMITATIONS: meal prep, cleaning, laundry, driving, shopping, and community activity   REHAB POTENTIAL: Good  CLINICAL DECISION MAKING: Evolving/moderate complexity  EVALUATION COMPLEXITY: Moderate   GOALS: Goals reviewed with patient? No  SHORT TERM GOALS: Target date: 06/17/2024 patient will be independent with initial HEP and compliant with HEP 3-4 times a week   Baseline: Goal status: INITIAL  2.  Patient will report 50% improvement overall  Baseline:  Goal status: INITIAL  3.  Patient will increase left knee mobility to -5 to 105 to promote normal navigation of steps; step over step pattern  Baseline: see above Goal status: INITIAL   LONG TERM GOALS: Target date: 07/15/2024  Patient will be independent in self management strategies to improve quality of life and functional outcomes.  Baseline:  Goal status: INITIAL  2.  Patient will report 70% improvement overall  Baseline:  Goal status: INITIAL  3.  Patient will increase left knee mobility to -2 to 120 to promote normal navigation of steps; step over step pattern  Baseline: see above Goal status: INITIAL  4.  Patient will improve 5 times sit to stand score to 15 sec or less to demonstrate improved functional mobility and increased leg strength.     Baseline:  Goal  status: INITIAL  5.  Patient will increase distance on 2 MWT to 300 ft or more with LRAD to demonstrate improved speed and efficiency with household and community ambulation  Baseline: 129 FT with RW Goal status: INITIAL  6.  Patient will improve LEFS score by 25 points to demonstrate improved perceived function  Baseline: 15/80 Goal status: INITIAL  7.   Patient will increase right leg MMT's to 5/5 to allow navigation of steps without gait deviation or loss of balance   Baseline: see above  Goal status: INITIAL   PLAN:  PT FREQUENCY: 2x/week  PT DURATION: 8 weeks  PLANNED INTERVENTIONS: [id number]- PT Re-evaluation, 97110-Therapeutic exercises, 97530- Therapeutic activity, 97112- Neuromuscular re-education, 97535- Self Care, [id number]- Manual therapy, [id number]- Gait training, [phone]- Orthotic Fit/training, [phone]- Canalith repositioning, [id number]- Aquatic Therapy, 97760- Splinting, 97597- Wound care (first 20 sq cm), 97598- Wound care (each additional 20 sq cm)Patient/Family education, Balance training, Stair training, Taping, Dry Needling, Joint mobilization, Joint manipulation, Spinal manipulation, Spinal mobilization, Scar mobilization, and DME instructions.   PLAN FOR NEXT SESSION: Review HEP and goals ; left knee mobility then strength; gait training; manual as appropriate; sees MD next week for surgical follow up; waterproof dressing still in  place at eval  Lang Ada, PT, DPT [facility] Office: [phone] 10:32 AM, 05/21/24   "

## 2024-05-22 ENCOUNTER — Other Ambulatory Visit (HOSPITAL_COMMUNITY): Payer: Self-pay

## 2024-05-22 ENCOUNTER — Other Ambulatory Visit (HOSPITAL_COMMUNITY): Payer: Self-pay | Admitting: Student

## 2024-05-25 ENCOUNTER — Ambulatory Visit (HOSPITAL_COMMUNITY): Admitting: Physical Therapy

## 2024-05-25 ENCOUNTER — Other Ambulatory Visit (HOSPITAL_COMMUNITY): Payer: Self-pay

## 2024-05-25 ENCOUNTER — Other Ambulatory Visit (HOSPITAL_COMMUNITY): Payer: Self-pay | Admitting: Student

## 2024-05-25 DIAGNOSIS — M1712 Unilateral primary osteoarthritis, left knee: Secondary | ICD-10-CM

## 2024-05-25 DIAGNOSIS — M25562 Pain in left knee: Secondary | ICD-10-CM

## 2024-05-25 DIAGNOSIS — R262 Difficulty in walking, not elsewhere classified: Secondary | ICD-10-CM

## 2024-05-25 DIAGNOSIS — M25662 Stiffness of left knee, not elsewhere classified: Secondary | ICD-10-CM

## 2024-05-25 MED ORDER — OXYCODONE HCL 5 MG PO TABS
5.0000 mg | ORAL_TABLET | ORAL | 0 refills | Status: AC | PRN
  Filled 2024-05-25 – 2024-05-26 (×2): qty 30, 5d supply, fill #0

## 2024-05-25 NOTE — Therapy (Signed)
 " OUTPATIENT PHYSICAL THERAPY LOWER EXTREMITY TREATMENT   Patient Name: Victoria Cooper MRN: [id number] DOB:16-Sep-1955, 69 y.o., female Today's Date: 05/25/2024  END OF SESSION:  PT End of Session - 05/25/24 0910     Visit Number 3    Number of Visits 16    Date for Recertification  07/15/24    Authorization Type Medicare    Authorization Time Period no auth    Progress Note Due on Visit 10    PT Start Time 0904    PT Stop Time 0945    PT Time Calculation (min) 41 min    Equipment Utilized During Treatment Gait belt    Activity Tolerance Patient tolerated treatment well    Behavior During Therapy [facility] for tasks assessed/performed           Past Medical History:  Diagnosis Date   Abnormal Pap smear of cervix 2006   Repeat Pap normal   Allergy    Animal bite 03/18/2011   Dog Bite   Arthritis    Asthma    Classical migraine with intractable migraine 11/30/2015   Complication of anesthesia    passed out after back surgery   Connective tissue disease, undifferentiated 2015   Dr. Mai   CVA (cerebral vascular accident) [facility])    Female hypogonadism syndrome 01/13/2019   GERD (gastroesophageal reflux disease)    Hyperlipidemia    Hypertension    Migraine    Skin cancer    Vitamin D deficiency disease 01/13/2019   Wears glasses    Past Surgical History:  Procedure Laterality Date   BACK SURGERY  2009   lumb lam-fusion   Basil cell     BREAST REDUCTION SURGERY  05/26/2012   Procedure: MAMMARY REDUCTION  (BREAST);  Surgeon: Kayla Pray, MD;  Location: [facility];  Service: Plastics;  Laterality: Bilateral;   COLONOSCOPY  2003, 2018   diverticulosis   TOTAL KNEE ARTHROPLASTY Left 05/13/2024   Procedure: ARTHROPLASTY, KNEE, TOTAL;  Surgeon: Ernie Cough, MD;  Location: [facility];  Service: Orthopedics;  Laterality: Left;   VAGINAL HYSTERECTOMY  2003   hyst   Patient Active Problem List   Diagnosis Date Noted   S/P total knee arthroplasty,  left 05/13/2024   S/P total knee replacement, left 05/13/2024   Vaginal atrophy 05/05/2023   Incontinence of feces 05/05/2023   Mixed stress and urge urinary incontinence 05/05/2023   Pelvic pain 05/05/2023   Pelvic organ prolapse quantification stage 2 cystocele 05/05/2023   Female hypogonadism syndrome 01/13/2019   Vitamin D deficiency disease 01/13/2019   Classical migraine with intractable migraine 11/30/2015   Upper airway cough syndrome 04/07/2013   Essential hypertension, benign 04/07/2013   Extrinsic asthma 03/05/2013   Animal bite 03/18/2011    ERE:Eyjmm, Ryan MD   REFERRING PROVIDER: Ernie Cough, MD  REFERRING DIAG: [phone] (ICD-10-CM) - Presence of left artificial knee joint Z47.1 (ICD-10-CM) - Aftercare following joint replacement surgery  THERAPY DIAG:  Acute pain of left knee  Stiffness of left knee, not elsewhere classified  Difficulty in walking, not elsewhere classified  Unilateral primary osteoarthritis, left knee  Rationale for Evaluation and Treatment: Rehabilitation  ONSET DATE: 05/13/2024  SUBJECTIVE:   SUBJECTIVE STATEMENT: Pt reports she is still stiff and hurting some as well. States most of her discomfort is where her TED stops.  Noted ring of indentation and educated on compression (below objective info).  States she has a cane but has not tried it yet.  Comes today  using RW.  Returns to MD tomorrow.  Eval: S/p left knee TKA 05/13/24; stayed overnight.  Had PT the next day. Nerve block left leg.  Left knee was just worn out and getting injections since 2019.  Last year went on a trip and did a lot of hiking and knee gave out; ready for total knee.    PERTINENT HISTORY: Lumbar fusion about 20 years ago PAIN:  Are you having pain? Yes: NPRS scale: 7/10 Pain location: left knee Pain description: real sore and aching Aggravating factors: up and moving Relieving factors: pain meds, ice machine  PRECAUTIONS: None   WEIGHT BEARING  RESTRICTIONS: No  FALLS:  Has patient fallen in last 6 months? No  LIVING ENVIRONMENT: Lives with: lives with their spouse Lives in: House/apartment Stairs: Yes: External: 3-4 steps; on left going up Has following equipment at home: Single point cane and Walker - 2 wheeled  OCCUPATION: retired  PLOF: Independent  PATIENT GOALS: to get my ROM back and to get back to optimal place  NEXT MD VISIT: 05/26/24  OBJECTIVE:  Note: Objective measures were completed at Evaluation unless otherwise noted.  DIAGNOSTIC FINDINGS:   PATIENT SURVEYS:  LEFS  Extreme difficulty/unable (0), Quite a bit of difficulty (1), Moderate difficulty (2), Little difficulty (3), No difficulty (4) Survey date:    Any of your usual work, housework or school activities   2. Usual hobbies, recreational or sporting activities   3. Getting into/out of the bath   4. Walking between rooms   5. Putting on socks/shoes   6. Squatting    7. Lifting an object, like a bag of groceries from the floor   8. Performing light activities around your home   9. Performing heavy activities around your home   10. Getting into/out of a car   11. Walking 2 blocks   12. Walking 1 mile   13. Going up/down 10 stairs (1 flight)   14. Standing for 1 hour   15.  sitting for 1 hour   16. Running on even ground   17. Running on uneven ground   18. Making sharp turns while running fast   19. Hopping    20. Rolling over in bed   Score total:  15/80; 18.8%     COGNITION: Overall cognitive status: Within functional limits for tasks assessed     SENSATION: WFL  EDEMA:  Normal for this time s/p   POSTURE: flexed trunk   PALPATION: General soreness normal for this time s/p  LOWER EXTREMITY ROM:  Active ROM Right eval Left eval Left  05/25/24  Hip flexion     Hip extension     Hip abduction     Hip adduction     Hip internal rotation     Hip external rotation     Knee flexion  92 105/110  Knee extension -2 -8 -4   Ankle dorsiflexion     Ankle plantarflexion     Ankle inversion     Ankle eversion      (Blank rows = not tested)  LOWER EXTREMITY MMT:  MMT Right eval Left eval  Hip flexion    Hip extension    Hip abduction    Hip adduction    Hip internal rotation    Hip external rotation    Knee flexion  2  Knee extension  2  Ankle dorsiflexion  4  Ankle plantarflexion    Ankle inversion    Ankle eversion     (  Blank rows = not tested)   FUNCTIONAL TESTS:  5 times sit to stand: 30.47 sec using hands to push up to standing 2 minute walk test: 129 ft with RW  GAIT: Distance walked: 129 ft with RW Assistive device utilized: [employer] - 2 wheeled Level of assistance: SBA Comments: slow antalgic gait                                                                                                                                TREATMENT DATE:  05/25/2024 Bike seat 10, 5 minutes full revolutions Gait training with SPC 200 feet Standing at // bars with bil UE assist  12 step Lt knee flexion stretch 10X10  Hamstring curls Lt 10X  Lt hamstring stretch with 12 step 3X20 Supine: quad sets 10X5  Heelsides 10X5  Lt SLR 10X AAROM (slight ext lag)  Bridge isometric 10X5 (min ROM) AROM to 110 degrees, 105 AAROM   05/21/2024  Manual Therapy: -L PROM and OP mobilizations for extension, 4 degrees from neutral obtained Therapeutic Exercise: -Stationary bike full, 5 minutes, seat 14, pt cued for 60 SPM -Heel slides, 3 sets of 10 reps, green strap for OP, pt cued for max ROM (104 degrees of flexion -Heel raises, 1 set of 20 reps, pt cued for one UE only -Knee drive and extension stretch on 12 inch step, 1 set of 7 reps, 5 second holds each direction, pt cued for OP with UE for extension and bodyweight for flexion (106 degrees) Neuromuscular Reeducation:  -Quad sets, 2 sets of 10 reps, cued for 5 second holds and external cue of towel roll used Therapeutic Activity: -Step up and overs, 4  inch step, 1 set of 5 reps bilaterally, pt cued for LLE dominance sequencing -Lateral step up and overs, 4 inch step, 1 sets of 5 reps, bilaterally, pt cued for decreased UE support and increased step length for foot landing clearance   05/19/24 physical therapy evaluation and HEP instruction    PATIENT EDUCATION:  Education details: Patient educated on exam findings, POC, scope of PT, HEP, and what to expect next visit. Person educated: Patient Education method: Explanation, Demonstration, and Handouts Education comprehension: verbalized understanding, returned demonstration, verbal cues required, and tactile cues required  05/25/2024:  Compression education; TEDs vs compression stockings  HOME EXERCISE PROGRAM: Access Code: [email or web address] URL: [email or web address] Date: 05/19/2024 Prepared by: [facility] - Rehab  Exercises - Supine Ankle Pumps  - 1 x daily - 7 x weekly - 3 sets - 15 reps - Supine Quadricep Sets  - 1 x daily - 7 x weekly - 3 sets - 15 reps - Supine Short Arc Quad  - 1 x daily - 7 x weekly - 3 sets - 15 reps - Supine Heel Slides  - 1 x daily - 7 x weekly - 3 sets - 15 reps - Seated Long Arc Quad  - 1 x daily - 7 x weekly - 3  sets - 15 reps - Seated Knee Flexion Stretch  - 1 x daily - 7 x weekly - 3 sets - 15 reps  ASSESSMENT:  CLINICAL IMPRESSION: Continued with primary focus on AROM for Lt knee.  Was able to move seat up 4 notches on bike and continue with full revolutions.  Standing knee flexion and hamstring stretch completed with 12 step up.  Trained using SPC with pt demonstrating correct sequencing, mild antalgia and good stability.  Recommended she switch to cane at this point.  Noted edema above TED into knee when completing manual.  Educated on difference between TEDS and and compression stockings and informed of need for thigh high garment to encompass knee and reduce edema.  Discussed ETI compression outlet (therapist forgot to measure and give sheet at  [employer]OS) also encouraged to discuss with MD at appointment tomorrow.  Pt  verbalized understanding.  Much improved AROM today of Lt knee to 105 degrees. 110 with AA.  SLR with slight extension lag due to weakness.  Manual revealed general tightness but no spasms. Pt will continue to benefit from skilled therapy to progress towards goals.   Eval: Patient is a 69 y.o. female who was seen today for physical therapy evaluation and treatment for Z96.652 (ICD-10-CM) - Presence of left artificial knee joint Z47.1 (ICD-10-CM) - Aftercare following joint replacement surgery. Patient demonstrates muscle weakness, reduced ROM, and fascial restrictions which are likely contributing to symptoms of pain and are negatively impacting patient ability to perform ADLs and functional mobility tasks. Patient will benefit from skilled physical therapy services to address these deficits to reduce pain and improve level of function with ADLs and functional mobility tasks.   OBJECTIVE IMPAIRMENTS: Abnormal gait, decreased activity tolerance, decreased balance, decreased mobility, difficulty walking, decreased ROM, decreased strength, increased edema, increased fascial restrictions, impaired perceived functional ability, and pain.   ACTIVITY LIMITATIONS: bending, sitting, standing, squatting, sleeping, stairs, transfers, bed mobility, and locomotion level  PARTICIPATION LIMITATIONS: meal prep, cleaning, laundry, driving, shopping, and community activity   REHAB POTENTIAL: Good  CLINICAL DECISION MAKING: Evolving/moderate complexity  EVALUATION COMPLEXITY: Moderate   GOALS: Goals reviewed with patient? No  SHORT TERM GOALS: Target date: 06/17/2024 patient will be independent with initial HEP and compliant with HEP 3-4 times a week   Baseline: Goal status: INITIAL  2.  Patient will report 50% improvement overall  Baseline:  Goal status: INITIAL  3.  Patient will increase left knee mobility to -5 to 105 to promote  normal navigation of steps; step over step pattern  Baseline: see above Goal status: INITIAL   LONG TERM GOALS: Target date: 07/15/2024  Patient will be independent in self management strategies to improve quality of life and functional outcomes.  Baseline:  Goal status: INITIAL  2.  Patient will report 70% improvement overall  Baseline:  Goal status: INITIAL  3.  Patient will increase left knee mobility to -2 to 120 to promote normal navigation of steps; step over step pattern  Baseline: see above Goal status: INITIAL  4.  Patient will improve 5 times sit to stand score to 15 sec or less to demonstrate improved functional mobility and increased leg strength.     Baseline:  Goal status: INITIAL  5.  Patient will increase distance on 2 MWT to 300 ft or more with LRAD to demonstrate improved speed and efficiency with household and community ambulation  Baseline: 129 FT with RW Goal status: INITIAL  6.  Patient will improve LEFS score by  25 points to demonstrate improved perceived function  Baseline: 15/80 Goal status: INITIAL  7.   Patient will increase right leg MMT's to 5/5 to allow navigation of steps without gait deviation or loss of balance   Baseline: see above  Goal status: INITIAL   PLAN:  PT FREQUENCY: 2x/week  PT DURATION: 8 weeks  PLANNED INTERVENTIONS: [id number]- PT Re-evaluation, 97110-Therapeutic exercises, 97530- Therapeutic activity, 97112- Neuromuscular re-education, 97535- Self Care, [id number]- Manual therapy, [id number]- Gait training, [phone]- Orthotic Fit/training, [phone]- Canalith repositioning, [id number]- Aquatic Therapy, 97760- Splinting, 97597- Wound care (first 20 sq cm), 97598- Wound care (each additional 20 sq cm)Patient/Family education, Balance training, Stair training, Taping, Dry Needling, Joint mobilization, Joint manipulation, Spinal manipulation, Spinal mobilization, Scar mobilization, and DME instructions.   PLAN FOR NEXT SESSION: continue to focus  on left knee mobility then strength; gait training; manual as appropriate; sees MD tomorrow for follow up. Measure for thigh highs and give ETI sheet if requests next session.  Greig KATHEE Fuse, PTA/CLT [facility] Ph: [phone]  9:14 AM, 05/25/24   "

## 2024-05-26 ENCOUNTER — Other Ambulatory Visit (HOSPITAL_COMMUNITY): Payer: Self-pay

## 2024-05-27 ENCOUNTER — Ambulatory Visit (HOSPITAL_COMMUNITY)

## 2024-05-27 ENCOUNTER — Encounter (HOSPITAL_COMMUNITY): Payer: Self-pay

## 2024-05-27 DIAGNOSIS — M25662 Stiffness of left knee, not elsewhere classified: Secondary | ICD-10-CM

## 2024-05-27 DIAGNOSIS — M25562 Pain in left knee: Secondary | ICD-10-CM | POA: Diagnosis not present

## 2024-05-27 DIAGNOSIS — M1712 Unilateral primary osteoarthritis, left knee: Secondary | ICD-10-CM

## 2024-05-27 DIAGNOSIS — R262 Difficulty in walking, not elsewhere classified: Secondary | ICD-10-CM

## 2024-05-27 NOTE — Therapy (Signed)
 " OUTPATIENT PHYSICAL THERAPY LOWER EXTREMITY TREATMENT   Patient Name: Victoria Cooper MRN: [id number] DOB:1955/06/13, 69 y.o., female Today's Date: 05/27/2024  END OF SESSION:  PT End of Session - 05/27/24 0955     Visit Number 4    Number of Visits 16    Date for Recertification  07/15/24    Authorization Type Medicare    Authorization Time Period no auth    Progress Note Due on Visit 10    PT Start Time 0955   late arrival   PT Stop Time 1027    PT Time Calculation (min) 32 min    Equipment Utilized During Treatment Gait belt    Activity Tolerance Patient tolerated treatment well    Behavior During Therapy WFL for tasks assessed/performed            Past Medical History:  Diagnosis Date   Abnormal Pap smear of cervix 2006   Repeat Pap normal   Allergy    Animal bite 03/18/2011   Dog Bite   Arthritis    Asthma    Classical migraine with intractable migraine 11/30/2015   Complication of anesthesia    passed out after back surgery   Connective tissue disease, undifferentiated 2015   Dr. Mai   CVA (cerebral vascular accident) [facility])    Female hypogonadism syndrome 01/13/2019   GERD (gastroesophageal reflux disease)    Hyperlipidemia    Hypertension    Migraine    Skin cancer    Vitamin D deficiency disease 01/13/2019   Wears glasses    Past Surgical History:  Procedure Laterality Date   BACK SURGERY  2009   lumb lam-fusion   Basil cell     BREAST REDUCTION SURGERY  05/26/2012   Procedure: MAMMARY REDUCTION  (BREAST);  Surgeon: Kayla Pray, MD;  Location: [facility];  Service: Plastics;  Laterality: Bilateral;   COLONOSCOPY  2003, 2018   diverticulosis   TOTAL KNEE ARTHROPLASTY Left 05/13/2024   Procedure: ARTHROPLASTY, KNEE, TOTAL;  Surgeon: Ernie Cough, MD;  Location: [facility];  Service: Orthopedics;  Laterality: Left;   VAGINAL HYSTERECTOMY  2003   hyst   Patient Active Problem List   Diagnosis Date Noted   S/P total knee  arthroplasty, left 05/13/2024   S/P total knee replacement, left 05/13/2024   Vaginal atrophy 05/05/2023   Incontinence of feces 05/05/2023   Mixed stress and urge urinary incontinence 05/05/2023   Pelvic pain 05/05/2023   Pelvic organ prolapse quantification stage 2 cystocele 05/05/2023   Female hypogonadism syndrome 01/13/2019   Vitamin D deficiency disease 01/13/2019   Classical migraine with intractable migraine 11/30/2015   Upper airway cough syndrome 04/07/2013   Essential hypertension, benign 04/07/2013   Extrinsic asthma 03/05/2013   Animal bite 03/18/2011    ERE:Eyjmm, Ryan MD   REFERRING PROVIDER: Ernie Cough, MD  REFERRING DIAG: [id number] (ICD-10-CM) - Presence of left artificial knee joint Z47.1 (ICD-10-CM) - Aftercare following joint replacement surgery  THERAPY DIAG:  Acute pain of left knee  Stiffness of left knee, not elsewhere classified  Difficulty in walking, not elsewhere classified  Unilateral primary osteoarthritis, left knee  Rationale for Evaluation and Treatment: Rehabilitation  ONSET DATE: 05/13/2024  SUBJECTIVE:   SUBJECTIVE STATEMENT: Pt states she got a good report from surgeon yesterday, states they took the bandage off. Pt states she is still taking the pain meds, states it is about 2-3/10 this date.  Eval: S/p left knee TKA 05/13/24; stayed overnight.  Had PT  the next day. Nerve block left leg.  Left knee was just worn out and getting injections since 2019.  Last year went on a trip and did a lot of hiking and knee gave out; ready for total knee.    PERTINENT HISTORY: Lumbar fusion about 20 years ago PAIN:  Are you having pain? Yes: NPRS scale: 7/10 Pain location: left knee Pain description: real sore and aching Aggravating factors: up and moving Relieving factors: pain meds, ice machine  PRECAUTIONS: None   WEIGHT BEARING RESTRICTIONS: No  FALLS:  Has patient fallen in last 6 months? No  LIVING ENVIRONMENT: Lives with:  lives with their spouse Lives in: House/apartment Stairs: Yes: External: 3-4 steps; on left going up Has following equipment at home: Single point cane and Walker - 2 wheeled  OCCUPATION: retired  PLOF: Independent  PATIENT GOALS: to get my ROM back and to get back to optimal place  NEXT MD VISIT: 05/26/24  OBJECTIVE:  Note: Objective measures were completed at Evaluation unless otherwise noted.  DIAGNOSTIC FINDINGS:   PATIENT SURVEYS:  LEFS  Extreme difficulty/unable (0), Quite a bit of difficulty (1), Moderate difficulty (2), Little difficulty (3), No difficulty (4) Survey date:    Any of your usual work, housework or school activities   2. Usual hobbies, recreational or sporting activities   3. Getting into/out of the bath   4. Walking between rooms   5. Putting on socks/shoes   6. Squatting    7. Lifting an object, like a bag of groceries from the floor   8. Performing light activities around your home   9. Performing heavy activities around your home   10. Getting into/out of a car   11. Walking 2 blocks   12. Walking 1 mile   13. Going up/down 10 stairs (1 flight)   14. Standing for 1 hour   15.  sitting for 1 hour   16. Running on even ground   17. Running on uneven ground   18. Making sharp turns while running fast   19. Hopping    20. Rolling over in bed   Score total:  15/80; 18.8%     COGNITION: Overall cognitive status: Within functional limits for tasks assessed     SENSATION: WFL  EDEMA:  Normal for this time s/p   POSTURE: flexed trunk   PALPATION: General soreness normal for this time s/p  LOWER EXTREMITY ROM:  Active ROM Right eval Left eval Left  05/25/24  Hip flexion     Hip extension     Hip abduction     Hip adduction     Hip internal rotation     Hip external rotation     Knee flexion  92 105/110  Knee extension -2 -8 -4  Ankle dorsiflexion     Ankle plantarflexion     Ankle inversion     Ankle eversion      (Blank rows  = not tested)  LOWER EXTREMITY MMT:  MMT Right eval Left eval  Hip flexion    Hip extension    Hip abduction    Hip adduction    Hip internal rotation    Hip external rotation    Knee flexion  2  Knee extension  2  Ankle dorsiflexion  4  Ankle plantarflexion    Ankle inversion    Ankle eversion     (Blank rows = not tested)   FUNCTIONAL TESTS:  5 times sit to stand: 30.47 sec  using hands to push up to standing 2 minute walk test: 129 ft with RW  GAIT: Distance walked: 129 ft with RW Assistive device utilized: [employer] - 2 wheeled Level of assistance: SBA Comments: slow antalgic gait                                                                                                                                TREATMENT DATE:  05/27/2024  Manual Therapy: -L Patellar mobs in all planes, pt demonstrates most tenderness to lateral and inferior planes -STM of LLE to knee joint, increased swelling noted , pt instructed on manual techniques for optimal scar healing Therapeutic Exercise: -Stationary bike full, 5 minutes, seat 9, pt cued for 60 SPM -Hamstring curl machine, 2 sets of 10 reps, plate 3 , pt cued for eccentric control and equal effort from each LE Therapeutic Activity: -Step ups, 8 inch step, 2 sets of 5 reps, bilaterally, pt cued for decreased UE support and LLE dominance -Lateral step up and overs, 6 inch step, 2 sets of 5 reps, pt cued for UE support Gait Training: 1 bout of 150 feet with SPC, pt cued for correct cane height and step through pattern   05/25/2024 Bike seat 10, 5 minutes full revolutions Gait training with SPC 200 feet Standing at // bars with bil UE assist  12 step Lt knee flexion stretch 10X10  Hamstring curls Lt 10X  Lt hamstring stretch with 12 step 3X20 Supine: quad sets 10X5  Heelsides 10X5  Lt SLR 10X AAROM (slight ext lag)  Bridge isometric 10X5 (min ROM) AROM to 110 degrees, 105 AAROM   05/21/2024  Manual Therapy: -L PROM  and OP mobilizations for extension, 4 degrees from neutral obtained Therapeutic Exercise: -Stationary bike full, 5 minutes, seat 10, pt cued for 60 SPM -Heel slides, 3 sets of 10 reps, green strap for OP, pt cued for max ROM (104 degrees of flexion -Heel raises, 1 set of 20 reps, pt cued for one UE only -Knee drive and extension stretch on 12 inch step, 1 set of 7 reps, 5 second holds each direction, pt cued for OP with UE for extension and bodyweight for flexion (106 degrees) Neuromuscular Reeducation:  -Quad sets, 2 sets of 10 reps, cued for 5 second holds and external cue of towel roll used Therapeutic Activity: -Step up and overs, 4 inch step, 1 set of 5 reps bilaterally, pt cued for LLE dominance sequencing -Lateral step up and overs, 4 inch step, 1 sets of 5 reps, bilaterally, pt cued for decreased UE support and increased step length for foot landing clearance    PATIENT EDUCATION:  Education details: Patient educated on exam findings, POC, scope of PT, HEP, and what to expect next visit. Person educated: Patient Education method: Explanation, Demonstration, and Handouts Education comprehension: verbalized understanding, returned demonstration, verbal cues required, and tactile cues required  05/25/2024:  Compression education; TEDs vs compression stockings  HOME  EXERCISE PROGRAM: Access Code: [email or web address] URL: [email or web address] Date: 05/19/2024 Prepared by: [facility] - Rehab  Exercises - Supine Ankle Pumps  - 1 x daily - 7 x weekly - 3 sets - 15 reps - Supine Quadricep Sets  - 1 x daily - 7 x weekly - 3 sets - 15 reps - Supine Short Arc Quad  - 1 x daily - 7 x weekly - 3 sets - 15 reps - Supine Heel Slides  - 1 x daily - 7 x weekly - 3 sets - 15 reps - Seated Long Arc Quad  - 1 x daily - 7 x weekly - 3 sets - 15 reps - Seated Knee Flexion Stretch  - 1 x daily - 7 x weekly - 3 sets - 15 reps  ASSESSMENT:  CLINICAL IMPRESSION: Patient continues to demonstrate  increased pain in L knee, decreased LLE strength, decreased gait quality and balance. Patient also demonstrates increased endurance with aerobic based exercise during today's session with ability to power up bike this date. Patient able to progress dynamic balance and gait normalization activation exercises today with ambulation with SPC and step up variations (progressed), good performance with verbal cueing. Patient educated on importance of consistent HEP compliance and manual therapy for decreased nerve type symptoms on LLE. Patient would continue to benefit from skilled physical therapy for decreased LLE pain, increased endurance with ambulation, increased LE strength/ROM, and improved balance for improved quality of life, improved independence with community ambulation and continued progress towards therapy goals.    Eval: Patient is a 69 y.o. female who was seen today for physical therapy evaluation and treatment for Z96.652 (ICD-10-CM) - Presence of left artificial knee joint Z47.1 (ICD-10-CM) - Aftercare following joint replacement surgery. Patient demonstrates muscle weakness, reduced ROM, and fascial restrictions which are likely contributing to symptoms of pain and are negatively impacting patient ability to perform ADLs and functional mobility tasks. Patient will benefit from skilled physical therapy services to address these deficits to reduce pain and improve level of function with ADLs and functional mobility tasks.   OBJECTIVE IMPAIRMENTS: Abnormal gait, decreased activity tolerance, decreased balance, decreased mobility, difficulty walking, decreased ROM, decreased strength, increased edema, increased fascial restrictions, impaired perceived functional ability, and pain.   ACTIVITY LIMITATIONS: bending, sitting, standing, squatting, sleeping, stairs, transfers, bed mobility, and locomotion level  PARTICIPATION LIMITATIONS: meal prep, cleaning, laundry, driving, shopping, and community  activity   REHAB POTENTIAL: Good  CLINICAL DECISION MAKING: Evolving/moderate complexity  EVALUATION COMPLEXITY: Moderate   GOALS: Goals reviewed with patient? No  SHORT TERM GOALS: Target date: 06/17/2024 patient will be independent with initial HEP and compliant with HEP 3-4 times a week   Baseline: Goal status: INITIAL  2.  Patient will report 50% improvement overall  Baseline:  Goal status: INITIAL  3.  Patient will increase left knee mobility to -5 to 105 to promote normal navigation of steps; step over step pattern  Baseline: see above Goal status: INITIAL   LONG TERM GOALS: Target date: 07/15/2024  Patient will be independent in self management strategies to improve quality of life and functional outcomes.  Baseline:  Goal status: INITIAL  2.  Patient will report 70% improvement overall  Baseline:  Goal status: INITIAL  3.  Patient will increase left knee mobility to -2 to 120 to promote normal navigation of steps; step over step pattern  Baseline: see above Goal status: INITIAL  4.  Patient will improve 5 times sit to stand score to 15 sec or  less to demonstrate improved functional mobility and increased leg strength.     Baseline:  Goal status: INITIAL  5.  Patient will increase distance on 2 MWT to 300 ft or more with LRAD to demonstrate improved speed and efficiency with household and community ambulation  Baseline: 129 FT with RW Goal status: INITIAL  6.  Patient will improve LEFS score by 25 points to demonstrate improved perceived function  Baseline: 15/80 Goal status: INITIAL  7.   Patient will increase right leg MMT's to 5/5 to allow navigation of steps without gait deviation or loss of balance   Baseline: see above  Goal status: INITIAL   PLAN:  PT FREQUENCY: 2x/week  PT DURATION: 8 weeks  PLANNED INTERVENTIONS: 97164- PT Re-evaluation, 97110-Therapeutic exercises, 97530- Therapeutic activity, 97112- Neuromuscular  re-education, 97535- Self Care, 02859- Manual therapy, [id number]- Gait training, [phone]- Orthotic Fit/training, [phone]- Canalith repositioning, [id number]- Aquatic Therapy, 97760- Splinting, 97597- Wound care (first 20 sq cm), 97598- Wound care (each additional 20 sq cm)Patient/Family education, Balance training, Stair training, Taping, Dry Needling, Joint mobilization, Joint manipulation, Spinal manipulation, Spinal mobilization, Scar mobilization, and DME instructions.   PLAN FOR NEXT SESSION: continue to focus on left knee mobility then strength; gait training; manual as appropriate; sees MD tomorrow for follow up. Measure for thigh highs and give ETI sheet if requests next session.  Lang Ada, PT, DPT [facility] Office: [phone] 10:38 AM, 05/27/24    "

## 2024-05-27 NOTE — Discharge Summary (Signed)
 Patient ID: Victoria Cooper MRN: [id number] DOB/AGE: January 29, 1956 69 y.o.  Admit date: 05/13/2024 Discharge date: 05/14/2024  Admission Diagnoses:  Left knee osteoarthritis  Discharge Diagnoses:  Principal Problem:   S/P total knee replacement, left Active Problems:   S/P total knee arthroplasty, left   Past Medical History:  Diagnosis Date   Abnormal Pap smear of cervix 2006   Repeat Pap normal   Allergy    Animal bite 03/18/2011   Dog Bite   Arthritis    Asthma    Classical migraine with intractable migraine 11/30/2015   Complication of anesthesia    passed out after back surgery   Connective tissue disease, undifferentiated 2015   Dr. Mai   [facility]A [facility] vascular accident) [facility])    Female hypogonadism syndrome 01/13/2019   GERD (gastroesophageal reflux disease)    Hyperlipidemia    Hypertension    Migraine    Skin cancer    Vitamin D deficiency disease 01/13/2019   Wears glasses     Surgeries: Procedures: ARTHROPLASTY, KNEE, TOTAL on 05/13/2024   Consultants:   Discharged Condition: Improved  Hospital Course: Victoria Cooper is an 69 y.o. female who was admitted 05/13/2024 for operative treatment ofS/P total knee replacement, left. Patient has severe unremitting pain that affects sleep, daily activities, and work/hobbies. After pre-op clearance the patient was taken to the operating room on 05/13/2024 and underwent  Procedures: ARTHROPLASTY, KNEE, TOTAL.    Patient was given perioperative antibiotics:  Anti-infectives (From admission, onward)    Start     Dose/Rate Route Frequency Ordered Stop   05/14/24 1000  hydroxychloroquine  (PLAQUENIL ) tablet 200 mg  Status:  Discontinued        200 mg Oral Daily 05/13/24 1323 05/14/24 1831   05/13/24 1430  ceFAZolin  (ANCEF ) IVPB 2g/100 mL premix        2 g 200 mL/hr over 30 Minutes Intravenous Every 6 hours 05/13/24 1323 05/14/24 0700   05/13/24 0700  ceFAZolin  (ANCEF ) IVPB 2g/100 mL premix        2 g 200 mL/hr  over 30 Minutes Intravenous On call to O.R. 05/13/24 9344 05/13/24 0839        Patient was given sequential compression devices, early ambulation, and chemoprophylaxis to prevent DVT. Patient worked with PT and was meeting their goals regarding safe ambulation and transfers.  Patient benefited maximally from hospital stay and there were no complications.    Recent vital signs: No data found.   Recent laboratory studies: No results for input(s): WBC, HGB, HCT, PLT, NA, K, CL, CO2, BUN, CREATININE, GLUCOSE, INR, CALCIUM  in the last 72 hours.  Invalid input(s): PT, 2   Discharge Medications:   Allergies as of 05/14/2024       Reactions   Morphine And Codeine  Itching   Sulfa Antibiotics Itching, Other (See Comments)   Itching in palms of hand and feet   Topamax  [topiramate ] Other (See Comments)   Unknown to patient, remember its was bad        Medication List     STOP taking these medications    aspirin  EC 81 MG tablet Replaced by: Aspirin  Low Dose 81 MG chewable tablet   diclofenac  75 MG EC tablet Commonly known as: VOLTAREN    tiZANidine  4 MG tablet Commonly known as: ZANAFLEX        TAKE these medications    acetaminophen  500 MG tablet Commonly known as: TYLENOL  Take 2 tablets (1,000 mg total) by mouth every 6 (six) hours.   Aspirin   Low Dose 81 MG chewable tablet Generic drug: aspirin  Chew 1 tablet (81 mg total) by mouth 2 (two) times daily for 28 days. Replaces: aspirin  EC 81 MG tablet   b complex vitamins capsule Take 1 capsule by mouth daily.   celecoxib  100 MG capsule Commonly known as: CELEBREX  Take 1 capsule (100 mg total) by mouth 2 (two) times daily as needed. What changed: when to take this   cyclobenzaprine  5 MG tablet Commonly known as: FLEXERIL  Take 1-2 tablets (5-10 mg total) by mouth 3 (three) times daily as needed for muscle spasms. What changed:  when to take this reasons to take this   estradiol  0.5 MG  tablet Commonly known as: ESTRACE  Take 1 tablet (0.5 mg total) by mouth daily.   ezetimibe  10 MG tablet Commonly known as: ZETIA  Take 1 tablet (10 mg total) by mouth daily.   gabapentin  300 MG capsule Commonly known as: NEURONTIN  Take 1 capsule (300 mg total) by mouth at bedtime. What changed:  when to take this reasons to take this   hydroxychloroquine  200 MG tablet Commonly known as: PLAQUENIL  Take 1 tablet (200 mg total) by mouth daily.   mirabegron  ER 25 MG Tb24 tablet Commonly known as: MYRBETRIQ  Take 1 tablet (25 mg total) by mouth daily.   mupirocin  ointment 2 % Commonly known as: BACTROBAN  Place 1 Application into the nose 2 (two) times daily for 60 doses. Use as directed 2 times daily for 5 days every other week for 6 weeks.   NONFORMULARY OR COMPOUNDED ITEM Testosterone  cream 0.1%.  Apply 0.5 grams daily to skin of arm, leg, or abdomen and rotate site.  Disp:  60 grams, RF:  none.  [facility]. What changed:  when to take this reasons to take this   Nurtec 75 MG Tbdp Generic drug: Rimegepant Sulfate  Take 75 mg by mouth daily as needed (migraine).   OVER THE COUNTER MEDICATION Take 1 tablet by mouth at bedtime. magnesium l threonate   polyethylene glycol powder 17 GM/SCOOP powder Commonly known as: GLYCOLAX /MIRALAX  Dissolve 1 capful (17g) in 4-8 ounces of liquid and take by mouth 2 times daily.   rosuvastatin  20 MG tablet Commonly known as: CRESTOR  Take 1 tablet (20 mg total) by mouth daily.   senna 8.6 MG Tabs tablet Commonly known as: SENOKOT Take 2 tablets (17.2 mg total) by mouth at bedtime for 14 days.   traZODone  150 MG tablet Commonly known as: DESYREL  Take 1 tablet (150 mg total) by mouth at bedtime.   valsartan -hydrochlorothiazide  160-12.5 MG tablet Commonly known as: DIOVAN -HCT Take 1 tablet by mouth daily.   Vitamin D-3 125 MCG (5000 UT) Tabs Take 10,000 Units by mouth daily.               Discharge Care Instructions   (From admission, onward)           Start     Ordered   05/14/24 0000  Change dressing       Comments: Maintain surgical dressing until follow up in the clinic. If the edges start to pull up, may reinforce with tape. If the dressing is no longer working, may remove and cover with gauze and tape, but must keep the area dry and clean.  Call with any questions or concerns.   05/14/24 0748            Diagnostic Studies: No results found.  Disposition: Discharge disposition: 01-Home or Self Care       Discharge Instructions  Call MD / Call 911   Complete by: As directed    If you experience chest pain or shortness of breath, CALL 911 and be transported to the hospital emergency room.  If you develope a fever above 101 F, pus (white drainage) or increased drainage or redness at the wound, or calf pain, call your surgeon's office.   Change dressing   Complete by: As directed    Maintain surgical dressing until follow up in the clinic. If the edges start to pull up, may reinforce with tape. If the dressing is no longer working, may remove and cover with gauze and tape, but must keep the area dry and clean.  Call with any questions or concerns.   Constipation Prevention   Complete by: As directed    Drink plenty of fluids.  Prune juice may be helpful.  You may use a stool softener, such as Colace (over the counter) 100 mg twice a day.  Use MiraLax  (over the counter) for constipation as needed.   Diet - low sodium heart healthy   Complete by: As directed    Increase activity slowly as tolerated   Complete by: As directed    Weight bearing as tolerated with assist device (walker, cane, etc) as directed, use it as long as suggested by your surgeon or therapist, typically at least 4-6 weeks.   Post-operative opioid taper instructions:   Complete by: As directed    POST-OPERATIVE OPIOID TAPER INSTRUCTIONS: It is important to wean off of your opioid medication as soon as possible.  If you do not need pain medication after your surgery it is ok to stop day one. Opioids include: Codeine , Hydrocodone(Norco, Vicodin), Oxycodone (Percocet, oxycontin ) and hydromorphone  amongst others.  Long term and even short term use of opiods can cause: Increased pain response Dependence Constipation Depression Respiratory depression And more.  Withdrawal symptoms can include Flu like symptoms Nausea, vomiting And more Techniques to manage these symptoms Hydrate well Eat regular healthy meals Stay active Use relaxation techniques(deep breathing, meditating, yoga) Do Not substitute Alcohol to help with tapering If you have been on opioids for less than two weeks and do not have pain than it is ok to stop all together.  Plan to wean off of opioids This plan should start within one week post op of your joint replacement. Maintain the same interval or time between taking each dose and first decrease the dose.  Cut the total daily intake of opioids by one tablet each day Next start to increase the time between doses. The last dose that should be eliminated is the evening dose.      TED hose   Complete by: As directed    Use stockings (TED hose) for 2 weeks on both leg(s).  You may remove them at night for sleeping.        Follow-up Information     Ernie Cough, MD. Schedule an appointment as soon as possible for a visit in 2 week(s).   Specialty: Orthopedic Surgery Contact information: [address] [place] [address] [phone]         Clarice Nottingham, MD Follow up on 05/19/2024.   Specialty: Internal Medicine Why: Hospital follow up appointment with KATIE TATE, [facility]  05/19/24 @ 2:45pm Contact information: [address] [place] [place] [address] [phone]                  Signed: Rosina JONELLE Calin 05/27/2024, 10:53 AM

## 2024-05-28 ENCOUNTER — Other Ambulatory Visit (HOSPITAL_COMMUNITY): Payer: Self-pay | Admitting: Student

## 2024-05-28 ENCOUNTER — Other Ambulatory Visit (HOSPITAL_COMMUNITY): Payer: Self-pay

## 2024-05-29 ENCOUNTER — Other Ambulatory Visit (HOSPITAL_COMMUNITY): Payer: Self-pay

## 2024-05-29 MED ORDER — OXYCODONE HCL 5 MG PO TABS
5.0000 mg | ORAL_TABLET | ORAL | 0 refills | Status: AC | PRN
  Filled 2024-05-30 – 2024-06-01 (×2): qty 30, 5d supply, fill #0

## 2024-05-30 ENCOUNTER — Other Ambulatory Visit: Payer: Self-pay

## 2024-05-30 ENCOUNTER — Other Ambulatory Visit (HOSPITAL_COMMUNITY): Payer: Self-pay

## 2024-05-31 ENCOUNTER — Other Ambulatory Visit (HOSPITAL_COMMUNITY): Payer: Self-pay

## 2024-05-31 ENCOUNTER — Other Ambulatory Visit: Payer: Self-pay

## 2024-06-01 ENCOUNTER — Encounter (HOSPITAL_COMMUNITY): Payer: Self-pay

## 2024-06-01 ENCOUNTER — Ambulatory Visit (HOSPITAL_COMMUNITY)

## 2024-06-01 ENCOUNTER — Other Ambulatory Visit (HOSPITAL_BASED_OUTPATIENT_CLINIC_OR_DEPARTMENT_OTHER): Payer: Self-pay

## 2024-06-01 ENCOUNTER — Other Ambulatory Visit (HOSPITAL_COMMUNITY): Payer: Self-pay

## 2024-06-01 DIAGNOSIS — M25662 Stiffness of left knee, not elsewhere classified: Secondary | ICD-10-CM

## 2024-06-01 DIAGNOSIS — M1712 Unilateral primary osteoarthritis, left knee: Secondary | ICD-10-CM

## 2024-06-01 DIAGNOSIS — R262 Difficulty in walking, not elsewhere classified: Secondary | ICD-10-CM

## 2024-06-01 DIAGNOSIS — M25562 Pain in left knee: Secondary | ICD-10-CM

## 2024-06-01 MED ORDER — CYCLOBENZAPRINE HCL 10 MG PO TABS
10.0000 mg | ORAL_TABLET | Freq: Three times a day (TID) | ORAL | 0 refills | Status: AC
  Filled 2024-06-01 (×3): qty 20, 7d supply, fill #0

## 2024-06-02 ENCOUNTER — Other Ambulatory Visit (HOSPITAL_BASED_OUTPATIENT_CLINIC_OR_DEPARTMENT_OTHER): Payer: Self-pay

## 2024-06-02 ENCOUNTER — Other Ambulatory Visit (HOSPITAL_COMMUNITY): Payer: Self-pay

## 2024-06-02 MED ORDER — PREDNISONE 5 MG PO TABS
ORAL_TABLET | ORAL | 0 refills | Status: AC
  Filled 2024-06-02: qty 48, 12d supply, fill #0

## 2024-06-03 ENCOUNTER — Ambulatory Visit (HOSPITAL_COMMUNITY): Admitting: Physical Therapy

## 2024-06-08 ENCOUNTER — Ambulatory Visit (HOSPITAL_COMMUNITY)

## 2024-06-10 ENCOUNTER — Ambulatory Visit (HOSPITAL_COMMUNITY)

## 2024-06-15 ENCOUNTER — Ambulatory Visit (HOSPITAL_COMMUNITY)

## 2024-06-17 ENCOUNTER — Ambulatory Visit (HOSPITAL_COMMUNITY)

## 2024-06-22 ENCOUNTER — Ambulatory Visit (HOSPITAL_COMMUNITY)

## 2024-06-24 ENCOUNTER — Ambulatory Visit (HOSPITAL_COMMUNITY)

## 2024-07-27 ENCOUNTER — Ambulatory Visit (HOSPITAL_COMMUNITY): Admit: 2024-07-27 | Admitting: Orthopedic Surgery

## 2024-07-27 SURGERY — ARTHROPLASTY, KNEE, TOTAL
Anesthesia: Spinal | Site: Knee | Laterality: Right

## 2024-11-23 ENCOUNTER — Telehealth: Admitting: Family Medicine
